# Patient Record
Sex: Female | Born: 1935 | Race: Black or African American | Hispanic: No | Smoking: Current every day smoker
Health system: Southern US, Community
[De-identification: ages and names within clinical notes are randomized; demographics above are authoritative.]

## PROBLEM LIST (undated history)

## (undated) DIAGNOSIS — I509 Heart failure, unspecified: Secondary | ICD-10-CM

## (undated) DIAGNOSIS — I10 Essential (primary) hypertension: Secondary | ICD-10-CM

## (undated) DIAGNOSIS — I519 Heart disease, unspecified: Secondary | ICD-10-CM

## (undated) DIAGNOSIS — M722 Plantar fascial fibromatosis: Secondary | ICD-10-CM

## (undated) DIAGNOSIS — I5030 Unspecified diastolic (congestive) heart failure: Secondary | ICD-10-CM

## (undated) DIAGNOSIS — G4762 Sleep related leg cramps: Secondary | ICD-10-CM

## (undated) DIAGNOSIS — H8110 Benign paroxysmal vertigo, unspecified ear: Secondary | ICD-10-CM

## (undated) DIAGNOSIS — M810 Age-related osteoporosis without current pathological fracture: Secondary | ICD-10-CM

## (undated) DIAGNOSIS — R222 Localized swelling, mass and lump, trunk: Secondary | ICD-10-CM

## (undated) DIAGNOSIS — E785 Hyperlipidemia, unspecified: Secondary | ICD-10-CM

## (undated) DIAGNOSIS — L84 Corns and callosities: Secondary | ICD-10-CM

## (undated) HISTORY — DX: Plantar fascial fibromatosis: M72.2

## (undated) HISTORY — DX: Heart failure, unspecified: I50.9

## (undated) HISTORY — DX: Benign paroxysmal vertigo, unspecified ear: H81.10

## (undated) HISTORY — DX: Age-related osteoporosis without current pathological fracture: M81.0

## (undated) HISTORY — DX: Essential (primary) hypertension: I10

## (undated) HISTORY — DX: Corns and callosities: L84

## (undated) HISTORY — DX: Hyperlipidemia, unspecified: E78.5

## (undated) HISTORY — PX: OTHER SURGICAL HISTORY: SHX169

## (undated) HISTORY — DX: Heart disease, unspecified: I51.9

## (undated) HISTORY — DX: Localized swelling, mass and lump, trunk: R22.2

## (undated) HISTORY — DX: Sleep related leg cramps: G47.62

---

## 2000-07-10 ENCOUNTER — Encounter (INDEPENDENT_AMBULATORY_CARE_PROVIDER_SITE_OTHER): Payer: Self-pay | Admitting: *Deleted

## 2000-07-21 ENCOUNTER — Encounter: Admission: RE | Admit: 2000-07-21 | Discharge: 2000-07-21 | Payer: Self-pay | Admitting: Family Medicine

## 2000-07-27 ENCOUNTER — Encounter: Admission: RE | Admit: 2000-07-27 | Discharge: 2000-07-27 | Payer: Self-pay | Admitting: *Deleted

## 2000-07-27 ENCOUNTER — Encounter: Payer: Self-pay | Admitting: *Deleted

## 2000-08-01 ENCOUNTER — Encounter: Admission: RE | Admit: 2000-08-01 | Discharge: 2000-08-01 | Payer: Self-pay | Admitting: Family Medicine

## 2000-09-19 ENCOUNTER — Encounter: Admission: RE | Admit: 2000-09-19 | Discharge: 2000-09-19 | Payer: Self-pay | Admitting: Family Medicine

## 2000-10-05 ENCOUNTER — Encounter: Admission: RE | Admit: 2000-10-05 | Discharge: 2000-10-05 | Payer: Self-pay | Admitting: Family Medicine

## 2000-12-13 ENCOUNTER — Encounter: Admission: RE | Admit: 2000-12-13 | Discharge: 2000-12-13 | Payer: Self-pay | Admitting: Family Medicine

## 2000-12-29 ENCOUNTER — Encounter: Admission: RE | Admit: 2000-12-29 | Discharge: 2000-12-29 | Payer: Self-pay | Admitting: Family Medicine

## 2002-04-19 ENCOUNTER — Encounter: Payer: Self-pay | Admitting: Emergency Medicine

## 2002-04-19 ENCOUNTER — Emergency Department (HOSPITAL_COMMUNITY): Admission: EM | Admit: 2002-04-19 | Discharge: 2002-04-19 | Payer: Self-pay | Admitting: Emergency Medicine

## 2002-04-22 ENCOUNTER — Encounter: Admission: RE | Admit: 2002-04-22 | Discharge: 2002-04-22 | Payer: Self-pay | Admitting: Family Medicine

## 2002-05-02 ENCOUNTER — Encounter: Admission: RE | Admit: 2002-05-02 | Discharge: 2002-05-02 | Payer: Self-pay | Admitting: Sports Medicine

## 2002-05-06 ENCOUNTER — Encounter: Admission: RE | Admit: 2002-05-06 | Discharge: 2002-05-06 | Payer: Self-pay | Admitting: Family Medicine

## 2002-05-09 ENCOUNTER — Encounter: Admission: RE | Admit: 2002-05-09 | Discharge: 2002-05-09 | Payer: Self-pay | Admitting: Family Medicine

## 2002-06-05 ENCOUNTER — Encounter: Admission: RE | Admit: 2002-06-05 | Discharge: 2002-06-05 | Payer: Self-pay | Admitting: Family Medicine

## 2002-06-14 ENCOUNTER — Encounter: Payer: Self-pay | Admitting: Sports Medicine

## 2002-06-14 ENCOUNTER — Encounter: Admission: RE | Admit: 2002-06-14 | Discharge: 2002-06-14 | Payer: Self-pay | Admitting: Sports Medicine

## 2002-06-26 ENCOUNTER — Encounter: Payer: Self-pay | Admitting: Sports Medicine

## 2002-06-26 ENCOUNTER — Encounter: Admission: RE | Admit: 2002-06-26 | Discharge: 2002-06-26 | Payer: Self-pay | Admitting: Sports Medicine

## 2002-07-02 ENCOUNTER — Encounter: Admission: RE | Admit: 2002-07-02 | Discharge: 2002-07-02 | Payer: Self-pay | Admitting: Family Medicine

## 2002-07-03 ENCOUNTER — Encounter: Admission: RE | Admit: 2002-07-03 | Discharge: 2002-07-03 | Payer: Self-pay | Admitting: Family Medicine

## 2002-07-16 ENCOUNTER — Encounter: Admission: RE | Admit: 2002-07-16 | Discharge: 2002-07-16 | Payer: Self-pay | Admitting: Family Medicine

## 2002-07-25 ENCOUNTER — Encounter: Admission: RE | Admit: 2002-07-25 | Discharge: 2002-07-25 | Payer: Self-pay | Admitting: Sports Medicine

## 2002-12-17 ENCOUNTER — Encounter: Admission: RE | Admit: 2002-12-17 | Discharge: 2002-12-17 | Payer: Self-pay | Admitting: Family Medicine

## 2002-12-18 ENCOUNTER — Encounter: Admission: RE | Admit: 2002-12-18 | Discharge: 2002-12-18 | Payer: Self-pay | Admitting: Sports Medicine

## 2002-12-18 ENCOUNTER — Encounter: Payer: Self-pay | Admitting: Sports Medicine

## 2002-12-20 ENCOUNTER — Encounter: Admission: RE | Admit: 2002-12-20 | Discharge: 2002-12-20 | Payer: Self-pay | Admitting: Sports Medicine

## 2002-12-20 ENCOUNTER — Encounter: Payer: Self-pay | Admitting: Sports Medicine

## 2003-01-20 ENCOUNTER — Inpatient Hospital Stay (HOSPITAL_COMMUNITY): Admission: EM | Admit: 2003-01-20 | Discharge: 2003-01-22 | Payer: Self-pay

## 2003-01-20 ENCOUNTER — Encounter: Payer: Self-pay | Admitting: Internal Medicine

## 2003-01-29 ENCOUNTER — Ambulatory Visit (HOSPITAL_COMMUNITY): Admission: RE | Admit: 2003-01-29 | Discharge: 2003-01-29 | Payer: Self-pay | Admitting: Family Medicine

## 2003-01-29 ENCOUNTER — Encounter: Admission: RE | Admit: 2003-01-29 | Discharge: 2003-01-29 | Payer: Self-pay | Admitting: Family Medicine

## 2003-06-25 ENCOUNTER — Encounter: Admission: RE | Admit: 2003-06-25 | Discharge: 2003-06-25 | Payer: Self-pay | Admitting: Family Medicine

## 2003-07-24 ENCOUNTER — Encounter: Admission: RE | Admit: 2003-07-24 | Discharge: 2003-07-24 | Payer: Self-pay | Admitting: Sports Medicine

## 2003-08-15 ENCOUNTER — Encounter: Admission: RE | Admit: 2003-08-15 | Discharge: 2003-08-15 | Payer: Self-pay | Admitting: Family Medicine

## 2003-09-25 ENCOUNTER — Inpatient Hospital Stay (HOSPITAL_COMMUNITY): Admission: EM | Admit: 2003-09-25 | Discharge: 2003-09-27 | Payer: Self-pay | Admitting: Emergency Medicine

## 2003-09-26 ENCOUNTER — Encounter: Payer: Self-pay | Admitting: Cardiology

## 2003-11-17 ENCOUNTER — Encounter: Admission: RE | Admit: 2003-11-17 | Discharge: 2003-11-17 | Payer: Self-pay | Admitting: Family Medicine

## 2003-12-15 ENCOUNTER — Encounter: Admission: RE | Admit: 2003-12-15 | Discharge: 2003-12-15 | Payer: Self-pay | Admitting: Family Medicine

## 2004-10-18 ENCOUNTER — Ambulatory Visit: Payer: Self-pay | Admitting: Family Medicine

## 2004-10-18 ENCOUNTER — Ambulatory Visit (HOSPITAL_COMMUNITY): Admission: RE | Admit: 2004-10-18 | Discharge: 2004-10-18 | Payer: Self-pay | Admitting: Sports Medicine

## 2005-02-16 ENCOUNTER — Emergency Department (HOSPITAL_COMMUNITY): Admission: EM | Admit: 2005-02-16 | Discharge: 2005-02-16 | Payer: Self-pay | Admitting: Emergency Medicine

## 2005-04-11 ENCOUNTER — Ambulatory Visit: Payer: Self-pay | Admitting: Family Medicine

## 2005-06-07 ENCOUNTER — Ambulatory Visit: Payer: Self-pay | Admitting: Family Medicine

## 2005-06-24 ENCOUNTER — Emergency Department (HOSPITAL_COMMUNITY): Admission: EM | Admit: 2005-06-24 | Discharge: 2005-06-24 | Payer: Self-pay | Admitting: Emergency Medicine

## 2005-07-06 ENCOUNTER — Ambulatory Visit: Payer: Self-pay | Admitting: Family Medicine

## 2005-08-25 ENCOUNTER — Ambulatory Visit: Payer: Self-pay | Admitting: Sports Medicine

## 2005-08-26 ENCOUNTER — Encounter: Admission: RE | Admit: 2005-08-26 | Discharge: 2005-08-26 | Payer: Self-pay | Admitting: Sports Medicine

## 2005-11-04 ENCOUNTER — Ambulatory Visit: Payer: Self-pay | Admitting: Sports Medicine

## 2005-11-21 ENCOUNTER — Emergency Department (HOSPITAL_COMMUNITY): Admission: EM | Admit: 2005-11-21 | Discharge: 2005-11-21 | Payer: Self-pay | Admitting: Emergency Medicine

## 2005-12-16 ENCOUNTER — Ambulatory Visit: Payer: Self-pay | Admitting: Family Medicine

## 2005-12-19 ENCOUNTER — Ambulatory Visit: Payer: Self-pay | Admitting: Family Medicine

## 2006-01-03 ENCOUNTER — Ambulatory Visit: Payer: Self-pay | Admitting: Sports Medicine

## 2006-01-25 ENCOUNTER — Ambulatory Visit: Payer: Self-pay | Admitting: Family Medicine

## 2006-08-17 ENCOUNTER — Ambulatory Visit: Payer: Self-pay | Admitting: Sports Medicine

## 2006-09-23 ENCOUNTER — Emergency Department (HOSPITAL_COMMUNITY): Admission: EM | Admit: 2006-09-23 | Discharge: 2006-09-23 | Payer: Self-pay | Admitting: Emergency Medicine

## 2006-12-07 DIAGNOSIS — K219 Gastro-esophageal reflux disease without esophagitis: Secondary | ICD-10-CM | POA: Insufficient documentation

## 2006-12-07 DIAGNOSIS — M1990 Unspecified osteoarthritis, unspecified site: Secondary | ICD-10-CM | POA: Insufficient documentation

## 2006-12-07 DIAGNOSIS — J309 Allergic rhinitis, unspecified: Secondary | ICD-10-CM | POA: Insufficient documentation

## 2006-12-07 DIAGNOSIS — I200 Unstable angina: Secondary | ICD-10-CM

## 2006-12-08 ENCOUNTER — Encounter (INDEPENDENT_AMBULATORY_CARE_PROVIDER_SITE_OTHER): Payer: Self-pay | Admitting: *Deleted

## 2006-12-27 ENCOUNTER — Encounter (INDEPENDENT_AMBULATORY_CARE_PROVIDER_SITE_OTHER): Payer: Self-pay | Admitting: Family Medicine

## 2006-12-27 ENCOUNTER — Ambulatory Visit: Payer: Self-pay | Admitting: Sports Medicine

## 2006-12-27 DIAGNOSIS — I10 Essential (primary) hypertension: Secondary | ICD-10-CM

## 2006-12-27 DIAGNOSIS — J4489 Other specified chronic obstructive pulmonary disease: Secondary | ICD-10-CM | POA: Insufficient documentation

## 2006-12-27 DIAGNOSIS — F17200 Nicotine dependence, unspecified, uncomplicated: Secondary | ICD-10-CM | POA: Insufficient documentation

## 2006-12-27 DIAGNOSIS — E785 Hyperlipidemia, unspecified: Secondary | ICD-10-CM | POA: Insufficient documentation

## 2006-12-27 DIAGNOSIS — J449 Chronic obstructive pulmonary disease, unspecified: Secondary | ICD-10-CM

## 2006-12-27 LAB — CONVERTED CEMR LAB
Albumin: 4.4 g/dL (ref 3.5–5.2)
Alkaline Phosphatase: 65 units/L (ref 39–117)
BUN: 16 mg/dL (ref 6–23)
CO2: 23 meq/L (ref 19–32)
Chloride: 104 meq/L (ref 96–112)
Glucose, Bld: 92 mg/dL (ref 70–99)
Protein, U semiquant: NEGATIVE
RBC / HPF: 0
Sodium: 140 meq/L (ref 135–145)
Total Protein: 7.5 g/dL (ref 6.0–8.3)
Urobilinogen, UA: 0.2

## 2007-02-27 ENCOUNTER — Ambulatory Visit: Payer: Self-pay | Admitting: Family Medicine

## 2007-02-27 LAB — CONVERTED CEMR LAB
Cholesterol, target level: 200 mg/dL
HDL goal, serum: 40 mg/dL

## 2007-02-28 ENCOUNTER — Encounter (INDEPENDENT_AMBULATORY_CARE_PROVIDER_SITE_OTHER): Payer: Self-pay | Admitting: Family Medicine

## 2007-02-28 ENCOUNTER — Ambulatory Visit (HOSPITAL_COMMUNITY): Admission: RE | Admit: 2007-02-28 | Discharge: 2007-02-28 | Payer: Self-pay | Admitting: Family Medicine

## 2007-03-02 ENCOUNTER — Encounter (INDEPENDENT_AMBULATORY_CARE_PROVIDER_SITE_OTHER): Payer: Self-pay | Admitting: *Deleted

## 2007-03-20 ENCOUNTER — Ambulatory Visit: Payer: Self-pay | Admitting: Family Medicine

## 2007-03-20 DIAGNOSIS — M810 Age-related osteoporosis without current pathological fracture: Secondary | ICD-10-CM | POA: Insufficient documentation

## 2007-03-21 ENCOUNTER — Ambulatory Visit (HOSPITAL_COMMUNITY): Admission: RE | Admit: 2007-03-21 | Discharge: 2007-03-21 | Payer: Self-pay | Admitting: Family Medicine

## 2007-03-21 ENCOUNTER — Ambulatory Visit: Payer: Self-pay | Admitting: Family Medicine

## 2007-03-30 ENCOUNTER — Ambulatory Visit: Payer: Self-pay | Admitting: Family Medicine

## 2007-03-30 ENCOUNTER — Encounter (INDEPENDENT_AMBULATORY_CARE_PROVIDER_SITE_OTHER): Payer: Self-pay | Admitting: Family Medicine

## 2007-03-30 LAB — CONVERTED CEMR LAB
Creatinine, Ser: 1.01 mg/dL (ref 0.40–1.20)
HDL: 58 mg/dL (ref >39)
Hemoglobin: 13.4 g/dL
Platelets: 290 10*3/uL
RBC: 4.21 M/uL

## 2007-04-25 ENCOUNTER — Ambulatory Visit: Payer: Self-pay | Admitting: Family Medicine

## 2007-04-25 ENCOUNTER — Encounter: Payer: Self-pay | Admitting: Family Medicine

## 2007-05-15 ENCOUNTER — Ambulatory Visit: Payer: Self-pay | Admitting: Sports Medicine

## 2007-05-30 ENCOUNTER — Encounter: Payer: Self-pay | Admitting: Family Medicine

## 2007-05-30 ENCOUNTER — Ambulatory Visit: Payer: Self-pay | Admitting: Family Medicine

## 2007-05-30 LAB — CONVERTED CEMR LAB
BUN: 13 mg/dL (ref 6–23)
CO2: 27 meq/L (ref 19–32)
Calcium: 9.1 mg/dL (ref 8.4–10.5)
Creatinine, Ser: 0.9 mg/dL (ref 0.40–1.20)
Potassium: 4.1 meq/L (ref 3.5–5.3)
Sodium: 140 meq/L (ref 135–145)

## 2007-07-03 ENCOUNTER — Ambulatory Visit: Payer: Self-pay | Admitting: Family Medicine

## 2007-07-17 ENCOUNTER — Ambulatory Visit: Payer: Self-pay | Admitting: Sports Medicine

## 2007-10-17 ENCOUNTER — Telehealth: Payer: Self-pay | Admitting: *Deleted

## 2007-11-02 ENCOUNTER — Ambulatory Visit: Payer: Self-pay | Admitting: Family Medicine

## 2008-07-16 ENCOUNTER — Ambulatory Visit (HOSPITAL_COMMUNITY): Admission: RE | Admit: 2008-07-16 | Discharge: 2008-07-16 | Payer: Self-pay | Admitting: Family Medicine

## 2008-07-16 ENCOUNTER — Ambulatory Visit: Payer: Self-pay | Admitting: Family Medicine

## 2008-07-16 DIAGNOSIS — R079 Chest pain, unspecified: Secondary | ICD-10-CM

## 2008-07-21 ENCOUNTER — Encounter: Payer: Self-pay | Admitting: Family Medicine

## 2008-07-22 ENCOUNTER — Encounter: Payer: Self-pay | Admitting: Family Medicine

## 2008-07-22 ENCOUNTER — Ambulatory Visit: Payer: Self-pay | Admitting: Family Medicine

## 2008-07-22 LAB — CONVERTED CEMR LAB
BUN: 22 mg/dL (ref 6–23)
Calcium: 9.4 mg/dL (ref 8.4–10.5)
Creatinine, Ser: 0.96 mg/dL (ref 0.40–1.20)
Glucose, Bld: 90 mg/dL (ref 70–99)
Potassium: 4.8 meq/L (ref 3.5–5.3)

## 2008-07-23 ENCOUNTER — Encounter: Payer: Self-pay | Admitting: Family Medicine

## 2008-09-15 ENCOUNTER — Telehealth: Payer: Self-pay | Admitting: *Deleted

## 2008-09-16 ENCOUNTER — Ambulatory Visit: Payer: Self-pay | Admitting: Family Medicine

## 2008-09-16 DIAGNOSIS — R42 Dizziness and giddiness: Secondary | ICD-10-CM

## 2008-09-22 ENCOUNTER — Encounter: Admission: RE | Admit: 2008-09-22 | Discharge: 2008-09-22 | Payer: Self-pay | Admitting: Family Medicine

## 2008-09-22 ENCOUNTER — Ambulatory Visit: Payer: Self-pay | Admitting: Family Medicine

## 2008-10-21 ENCOUNTER — Encounter: Payer: Self-pay | Admitting: Family Medicine

## 2008-10-21 ENCOUNTER — Ambulatory Visit: Payer: Self-pay | Admitting: Family Medicine

## 2008-10-27 ENCOUNTER — Encounter (INDEPENDENT_AMBULATORY_CARE_PROVIDER_SITE_OTHER): Payer: Self-pay | Admitting: *Deleted

## 2008-10-27 ENCOUNTER — Ambulatory Visit: Payer: Self-pay | Admitting: Family Medicine

## 2008-10-28 ENCOUNTER — Encounter: Payer: Self-pay | Admitting: Family Medicine

## 2008-10-28 ENCOUNTER — Ambulatory Visit: Payer: Self-pay | Admitting: Family Medicine

## 2008-10-28 LAB — CONVERTED CEMR LAB
ALT: 11 units/L (ref 0–35)
Albumin: 3.8 g/dL (ref 3.5–5.2)
Glucose, Bld: 86 mg/dL (ref 70–99)
Potassium: 4.6 meq/L (ref 3.5–5.3)
Sodium: 143 meq/L (ref 135–145)
Total Bilirubin: 0.5 mg/dL (ref 0.3–1.2)
Total Protein: 6.4 g/dL (ref 6.0–8.3)
Triglycerides: 69 mg/dL (ref <150)
VLDL: 14 mg/dL (ref 0–40)

## 2008-10-29 ENCOUNTER — Encounter: Payer: Self-pay | Admitting: Family Medicine

## 2009-01-15 ENCOUNTER — Ambulatory Visit: Payer: Self-pay | Admitting: Family Medicine

## 2009-01-15 ENCOUNTER — Ambulatory Visit (HOSPITAL_COMMUNITY): Admission: RE | Admit: 2009-01-15 | Discharge: 2009-01-15 | Payer: Self-pay | Admitting: Family Medicine

## 2009-01-21 ENCOUNTER — Encounter: Payer: Self-pay | Admitting: Family Medicine

## 2009-02-18 ENCOUNTER — Ambulatory Visit (HOSPITAL_COMMUNITY): Admission: RE | Admit: 2009-02-18 | Discharge: 2009-02-18 | Payer: Self-pay | Admitting: Family Medicine

## 2009-02-18 ENCOUNTER — Telehealth: Payer: Self-pay | Admitting: Family Medicine

## 2009-02-18 ENCOUNTER — Ambulatory Visit: Payer: Self-pay | Admitting: Family Medicine

## 2009-02-20 ENCOUNTER — Encounter: Payer: Self-pay | Admitting: Family Medicine

## 2009-08-28 ENCOUNTER — Ambulatory Visit: Payer: Self-pay | Admitting: Family Medicine

## 2009-09-11 ENCOUNTER — Encounter: Payer: Self-pay | Admitting: Family Medicine

## 2009-09-11 ENCOUNTER — Ambulatory Visit: Payer: Self-pay | Admitting: Family Medicine

## 2009-09-11 LAB — CONVERTED CEMR LAB
ALT: 14 units/L (ref 0–35)
AST: 18 units/L (ref 0–37)
Alkaline Phosphatase: 70 units/L (ref 39–117)
BUN: 22 mg/dL (ref 6–23)
Calcium: 9.7 mg/dL (ref 8.4–10.5)
Creatinine, Ser: 1.02 mg/dL (ref 0.40–1.20)
Total CK: 107 units/L (ref 7–177)

## 2009-09-14 ENCOUNTER — Encounter: Payer: Self-pay | Admitting: Family Medicine

## 2009-11-27 ENCOUNTER — Ambulatory Visit: Payer: Self-pay | Admitting: Family Medicine

## 2009-11-27 ENCOUNTER — Encounter: Payer: Self-pay | Admitting: Family Medicine

## 2009-11-27 DIAGNOSIS — R609 Edema, unspecified: Secondary | ICD-10-CM

## 2009-11-27 DIAGNOSIS — I499 Cardiac arrhythmia, unspecified: Secondary | ICD-10-CM | POA: Insufficient documentation

## 2009-11-27 LAB — CONVERTED CEMR LAB
ALT: 12 units/L (ref 0–35)
AST: 15 units/L (ref 0–37)
CO2: 26 meq/L (ref 19–32)
Calcium: 9.3 mg/dL (ref 8.4–10.5)
Chloride: 103 meq/L (ref 96–112)
Creatinine, Ser: 0.96 mg/dL (ref 0.40–1.20)
Glucose, Bld: 77 mg/dL (ref 70–99)
Pro B Natriuretic peptide (BNP): 38.7 pg/mL (ref 0.0–100.0)
Sodium: 140 meq/L (ref 135–145)
Total Protein: 7.1 g/dL (ref 6.0–8.3)

## 2009-11-28 ENCOUNTER — Encounter: Payer: Self-pay | Admitting: Family Medicine

## 2009-12-03 ENCOUNTER — Encounter: Payer: Self-pay | Admitting: Family Medicine

## 2010-01-20 ENCOUNTER — Encounter: Payer: Self-pay | Admitting: Family Medicine

## 2010-01-20 ENCOUNTER — Ambulatory Visit: Payer: Self-pay | Admitting: Family Medicine

## 2010-01-20 LAB — CONVERTED CEMR LAB
Bilirubin Urine: NEGATIVE
Glucose, Urine, Semiquant: NEGATIVE
HCT: 39.7 % (ref 36.0–46.0)
Ketones, urine, test strip: NEGATIVE
MCHC: 32 g/dL (ref 30.0–36.0)
MCV: 94.7 fL (ref 78.0–100.0)
Nitrite: NEGATIVE
pH: 6.5

## 2010-01-21 ENCOUNTER — Encounter: Payer: Self-pay | Admitting: Family Medicine

## 2010-01-25 ENCOUNTER — Telehealth: Payer: Self-pay | Admitting: Family Medicine

## 2010-01-26 ENCOUNTER — Ambulatory Visit: Payer: Self-pay | Admitting: Family Medicine

## 2010-01-26 ENCOUNTER — Encounter: Admission: RE | Admit: 2010-01-26 | Discharge: 2010-01-26 | Payer: Self-pay | Admitting: Family Medicine

## 2010-01-26 DIAGNOSIS — M25579 Pain in unspecified ankle and joints of unspecified foot: Secondary | ICD-10-CM

## 2010-02-12 ENCOUNTER — Encounter: Admission: RE | Admit: 2010-02-12 | Discharge: 2010-02-12 | Payer: Self-pay | Admitting: Family Medicine

## 2010-02-12 ENCOUNTER — Ambulatory Visit: Payer: Self-pay | Admitting: Family Medicine

## 2010-02-24 ENCOUNTER — Encounter: Admission: RE | Admit: 2010-02-24 | Discharge: 2010-03-04 | Payer: Self-pay | Admitting: Family Medicine

## 2010-02-26 ENCOUNTER — Ambulatory Visit: Payer: Self-pay | Admitting: Family Medicine

## 2010-03-10 ENCOUNTER — Encounter: Payer: Self-pay | Admitting: Family Medicine

## 2010-03-15 ENCOUNTER — Encounter: Payer: Self-pay | Admitting: Family Medicine

## 2010-07-28 ENCOUNTER — Ambulatory Visit: Payer: Self-pay | Admitting: Family Medicine

## 2010-07-28 ENCOUNTER — Telehealth: Payer: Self-pay | Admitting: Family Medicine

## 2010-08-23 ENCOUNTER — Ambulatory Visit: Payer: Self-pay | Admitting: Family Medicine

## 2010-08-24 ENCOUNTER — Encounter: Payer: Self-pay | Admitting: Family Medicine

## 2010-08-24 ENCOUNTER — Ambulatory Visit: Payer: Self-pay | Admitting: Family Medicine

## 2010-08-24 LAB — CONVERTED CEMR LAB
BUN: 16 mg/dL (ref 6–23)
CO2: 29 meq/L (ref 19–32)
Calcium: 9 mg/dL (ref 8.4–10.5)
Creatinine, Ser: 1.05 mg/dL (ref 0.40–1.20)
Glucose, Bld: 90 mg/dL (ref 70–99)
HDL: 53 mg/dL (ref >39)
LDL Cholesterol: 118 mg/dL — ABNORMAL HIGH (ref 0–99)
VLDL: 11 mg/dL (ref 0–40)

## 2010-08-26 ENCOUNTER — Encounter: Payer: Self-pay | Admitting: Family Medicine

## 2010-11-03 ENCOUNTER — Encounter (INDEPENDENT_AMBULATORY_CARE_PROVIDER_SITE_OTHER): Payer: Self-pay | Admitting: *Deleted

## 2010-11-09 NOTE — Assessment & Plan Note (Signed)
Summary: fu/tcb   Vital Signs:  Patient profile:   77 year old female Height:      66 inches Weight:      174.3 pounds BMI:     28.23 Temp:     97.7 degrees F oral Pulse rate:   73 / minute BP sitting:   156 / 77  (left arm) Cuff size:   regular  Vitals Entered By: Garen Grams LPN (Feb 26, 2010 8:43 AM) CC: f/u bp, ankle Is Patient Diabetic? No Pain Assessment Patient in pain? no        Primary Care Provider:  Romero Belling MD  CC:  f/u bp and ankle.  History of Present Illness: 77 yo female here for f/u of:  ANKLE PAIN.  Much improved after dropping picnic table on her foot.  Xrays x2 demonstrate no fracture.  Taking Ibuprofen for pain, using ice, intermittent Ace wrap.  Essentially no pain and has normal ROM in LEFT ankle at this point.  Went to PT x1, but has not gone back as it is very expensive.    HTN.  Not taking medicines.  Denies chest pain, dyspnea, orthopnea, PND, LE edema.  Endorses DOE after 1-2 blocks of walking.  ALLERGIC RHINITIS.  Not taking Fluticasone.  Nasal congestion, rhinorrhea, bilateral maxillary pressure for the past few days.  Is a smoker.  Afebrile in clinic.  Also reviewed today:  HLD.  Lipids given other risk factors (smoking, HTN).  Not taking her Lipitor.  MEDICATION NONADHERENCE.  Discussed barriers to medication adherence.  Finances not an issue for this.  Cannot say why she doesn't take.  Declines assistance.  Habits & Providers  Alcohol-Tobacco-Diet     Tobacco Status: current     Tobacco Counseling: to quit use of tobacco products  Current Medications (verified): 1)  Loratadine 10 Mg Tabs (Loratadine) .Marland Kitchen.. 1 Tab By Mouth Daily 2)  Fosamax 70 Mg Tabs (Alendronate Sodium) .Marland Kitchen.. 1 Tab Weekly 3)  Lipitor 80 Mg Tabs (Atorvastatin Calcium) .... Take 1 Tablet By Mouth As Directed 4)  Fluticasone Propionate 50 Mcg/act Susp (Fluticasone Propionate) .Marland Kitchen.. 1 Spray Per Nostril Daily 5)  Famotidine 20 Mg Tabs (Famotidine) .Marland Kitchen.. 1 Tab By Mouth  Two Times A Day 6)  Altace 5 Mg  Tabs (Ramipril) .Marland Kitchen.. 1 Tab By Mouth Daily 7)  Imdur 30 Mg  Tb24 (Isosorbide Mononitrate) .Marland Kitchen.. 1 Tab By Mouth Daily 8)  Adult Aspirin Ec Low Strength 81 Mg  Tbec (Aspirin) .Marland Kitchen.. 1 Tab By Mouth Daily 9)  Hydrochlorothiazide 25 Mg  Tabs (Hydrochlorothiazide) .Marland Kitchen.. 1 Tab By Mouth Daily  Allergies (verified): 1)  ! Codeine 2)  Codeine Phosphate (Codeine Phosphate)  Review of Systems       Per HPI.  Physical Exam  Additional Exam:  VITALS:  Reviewed, hypertensive GEN: Alert & oriented, no acute distress NECK: Midline trachea, no masses/thyromegaly, no cervical lymphadenopathy CARDIO: Regular rate and rhythm, no murmurs/rubs/gallops, 2+ bilateral radial pulses RESP: Clear to auscultation, normal work of breathing, no retractions/accessory muscle use EXT: Nontender, no edema EARS:  External ear without significant lesions or deformities.  Clear canals, TM intact bilaterally without bulging, retraction, inflammation or discharge. Hearing grossly normal bilaterally. NOSE:  Nasal mucosa are pink and moist without lesions or exudates. MOUTH:  Oral mucosa and oropharynx without lesions or exudates. MSK:  LEFT ankle: nontender, trace swelling, full ROM, 2+ DP, sensation intact to light touch    Impression & Recommendations:  Problem # 1:  ANKLE PAIN, LEFT (  IHK-742.59) Assessment Improved Asked to call and cancel at PT if she won't be going.  Switch from Ibuprofen to APAP as needed for pain given HTN.  Follow up as needed. Orders: [facility]- Est  Level 4 (56387)  Problem # 2:  HYPERTENSION, BENIGN ESSENTIAL (ICD-401.1) Assessment: Unchanged Poorly controlled chronically but admits to medication nonadherence.  Advised to add back medications one at a time every 3 days.  RTC if becomes dizzy. Her updated medication list for this problem includes:    Altace 5 Mg Tabs (Ramipril) .Marland Kitchen... 1 tab by mouth daily    Hydrochlorothiazide 25 Mg Tabs (Hydrochlorothiazide) .Marland Kitchen... 1  tab by mouth daily  BP today: 156/77 Prior BP: 170/73 (02/12/2010)  Prior 10 Yr Risk Heart Disease: N/A (02/27/2007)  Labs Reviewed: K+: 4.3 (11/27/2009) Creat: : 0.96 (11/27/2009)   Chol: 269 (10/28/2008)   HDL: 53 (10/28/2008)   LDL: 202 (10/28/2008)   TG: 69 (10/28/2008)  Orders: [facility]- Est  Level 4 (56433)  Problem # 3:  HYPERLIPIDEMIA (ICD-272.4) Assessment: Unchanged Poorly controlled on maximum dose of Lipitor, but admits to medication nonadherence.  Advised to restart Lipitor. Her updated medication list for this problem includes:    Lipitor 80 Mg Tabs (Atorvastatin calcium) .Marland Kitchen... Take 1 tablet by mouth as directed  Labs Reviewed: SGOT: 15 (11/27/2009)   SGPT: 12 (11/27/2009)  Lipid Goals: Chol Goal: 200 (02/27/2007)   HDL Goal: 40 (02/27/2007)   LDL Goal: 100 (02/27/2007)   TG Goal: 150 (02/27/2007)  Prior 10 Yr Risk Heart Disease: N/A (02/27/2007)   HDL:53 (10/28/2008), 58 (03/30/2007)  LDL:202 (10/28/2008), 190 (03/30/2007)  Chol:269 (10/28/2008), 263 (03/30/2007)  Trig:69 (10/28/2008), 77 (03/30/2007)  Orders: [facility]- Est  Level 4 (99214)  Problem # 4:  RHINITIS, ALLERGIC (ICD-477.9) Assessment: Deteriorated Refill Fluticasone. Her updated medication list for this problem includes:    Loratadine 10 Mg Tabs (Loratadine) .Marland Kitchen... 1 tab by mouth daily    Fluticasone Propionate 50 Mcg/act Susp (Fluticasone propionate) .Marland Kitchen... 1 spray per nostril daily  Orders: [facility]- Est  Level 4 (29518)  Complete Medication List: 1)  Loratadine 10 Mg Tabs (Loratadine) .Marland Kitchen.. 1 tab by mouth daily 2)  Fosamax 70 Mg Tabs (Alendronate sodium) .Marland Kitchen.. 1 tab weekly 3)  Lipitor 80 Mg Tabs (Atorvastatin calcium) .... Take 1 tablet by mouth as directed 4)  Fluticasone Propionate 50 Mcg/act Susp (Fluticasone propionate) .Marland Kitchen.. 1 spray per nostril daily 5)  Famotidine 20 Mg Tabs (Famotidine) .Marland Kitchen.. 1 tab by mouth two times a day 6)  Altace 5 Mg Tabs (Ramipril) .Marland Kitchen.. 1 tab by mouth daily 7)  Imdur 30 Mg Tb24  (Isosorbide mononitrate) .Marland Kitchen.. 1 tab by mouth daily 8)  Adult Aspirin Ec Low Strength 81 Mg Tbec (Aspirin) .Marland Kitchen.. 1 tab by mouth daily 9)  Hydrochlorothiazide 25 Mg Tabs (Hydrochlorothiazide) .Marland Kitchen.. 1 tab by mouth daily  Patient Instructions: 1)  It has been a pleasure working with you. 2)  Please schedule a follow-up appointment in 3 months.  3)  Please stop taking Ibuprofen--use Tylenol as needed for pain relief (1000 mg by mouth three times a day). 4)  I have sent a prescription for Fluticasone nasal spray for your allergies. 5)  Please start taking your hydrochlorothiazide daily for blood pressure.  Then add your Altace after 3 days if not dizzy, then add Imdur after 3 days if not dizzy. 6)  Call our office if you become dizzy on your medicines or if you develop chest pain or shortness of breath. Prescriptions: FLUTICASONE PROPIONATE 50  MCG/ACT SUSP (FLUTICASONE PROPIONATE) 1 spray per nostril daily  #1 x 3   Entered and Authorized by:   Romero Belling MD   Signed by:   Romero Belling MD on 02/26/2010   Method used:   Electronically to        [facility]  [address] [id number] (retail)       [address]       [place], [place]  [address]       Ph: [phone] or [phone]       Fax: [phone]   RxID:   [id number]

## 2010-11-09 NOTE — Progress Notes (Signed)
Summary: triage  Phone Note Call from Patient Call back at Home Phone [phone]   Caller: Patient Summary of Call: Pt dropped pinic table on ankle. Initial call taken by: Clydell Hakim,  January 25, 2010 11:47 AM  Follow-up for Phone Call        happened yesterday. large wood table. swollen & bruised. able to walk with a hobble. to be here at 1:30 .will see Dr. Janalyn Harder Follow-up by: Golden Circle RN,  January 25, 2010 12:02 PM

## 2010-11-09 NOTE — Assessment & Plan Note (Signed)
Summary: dizziness/Earl/spiegel   Vital Signs:  Patient profile:   77 year old female Height:      66 inches Weight:      165.7 pounds BMI:     26.84 Temp:     98.0 degrees F oral Pulse rate:   69 / minute BP sitting:   134 / 62  (left arm) Cuff size:   regular  Vitals Entered By: Jimmy Footman, CMA (July 28, 2010 10:28 AM) CC: dizziness x3 days Is Patient Diabetic? No Pain Assessment Patient in pain? no        Primary Care Provider:  Ellery Plunk MD  CC:  dizziness x3 days.  History of Present Illness: Dizzy since Monday  (3 days).  Describes a lightheadded sensation and a feeling of unsteadyness on feet.  Getting over a cold last week. No medication for cold. Got better on own. No new medication. No sedating medication. No vertigo, headache, vision changes. No fever, chills, N/V.  Well otherwise.   Habits & Providers  Alcohol-Tobacco-Diet     Alcohol drinks/day: 0     Tobacco Status: current     Tobacco Counseling: to quit use of tobacco products     Cigarette Packs/Day: 1.0     Year Started: 1948     Pack years: 60  Current Problems (verified): 1)  Dizziness  (ICD-780.4) 2)  Hyperlipidemia  (ICD-272.4) 3)  Hypertension, Benign Essential  (ICD-401.1) 4)  Ankle Pain, Left  (ICD-719.47) 5)  Edema  (ICD-782.3) 6)  Irregular Heart Rate  (ICD-427.9) 7)  Vertigo  (ICD-780.4) 8)  Chest Pain  (ICD-786.50) 9)  Osteoporosis Nos  (ICD-733.00) 10)  Rhinitis, Allergic  (ICD-477.9) 11)  Gastroesophageal Reflux, No Esophagitis  (ICD-530.81) 12)  Djd, Unspecified  (ICD-715.90) 13)  Angina, Unstable  (ICD-411.1) 14)  Tobacco Use  (ICD-305.1) 15)  Chronic Obstructive Pulmonary Disease  (ICD-496)  Current Medications (verified): 1)  Loratadine 10 Mg Tabs (Loratadine) .Marland Kitchen.. 1 Tab By Mouth Daily 2)  Fosamax 70 Mg Tabs (Alendronate Sodium) .Marland Kitchen.. 1 Tab Weekly 3)  Lipitor 80 Mg Tabs (Atorvastatin Calcium) .... Take 1 Tablet By Mouth As Directed 4)  Fluticasone Propionate 50  Mcg/act Susp (Fluticasone Propionate) .Marland Kitchen.. 1 Spray Per Nostril Daily 5)  Famotidine 20 Mg Tabs (Famotidine) .Marland Kitchen.. 1 Tab By Mouth Two Times A Day 6)  Altace 5 Mg  Tabs (Ramipril) .Marland Kitchen.. 1 Tab By Mouth Daily 7)  Imdur 30 Mg  Tb24 (Isosorbide Mononitrate) .Marland Kitchen.. 1 Tab By Mouth Daily 8)  Adult Aspirin Ec Low Strength 81 Mg  Tbec (Aspirin) .Marland Kitchen.. 1 Tab By Mouth Daily 9)  Hydrochlorothiazide 25 Mg  Tabs (Hydrochlorothiazide) .Marland Kitchen.. 1 Tab By Mouth Daily  Allergies (verified): 1)  ! Codeine 2)  Codeine Phosphate (Codeine Phosphate)  Past History:  Past Medical History: Last updated: 04/25/2007 Gastric ulcer w/hemorrhage (08/2000) [employer] - 1 ppd-- > 6 cigs/day, attempting to quit  Past Surgical History: Last updated: 04/25/2007 12/04 carotid dopplers; mild int wall thickening b/l  12/04 2D Echo- EF 55-65%, mild increased PA pressures ABI=0.97 (normal) - 01/04/2006 Cardiac Cath- mild- mod LV syst fxn with HK of mid inf/ant walls from diffuse coronary vasospasm - 01/29/2003, dexa scan - T -2.39  Z -1.17 mod/high risk - 07/10/2002, dexa scan T score =  -2.24 (AA database) - 07/16/2002, BMD scan  T = -2.39 (r heel)BMD scan  T = -2.87 (r heel) - 07/10/2000 upper GI  12/98 - normal  Social History: Last updated: 01/26/2010 Widowed, lives  with daughter and daughter's two children; >50 pack years; no etoh; no drug use; enjoys gardening, cooking, crafts; [place]  Risk Factors: Smoking Status: current (07/28/2010) Packs/Day: 1.0 (07/28/2010)  Family History: Reviewed history from 12/07/2006 and no changes required. HTN; arthritis  Review of Systems  The patient denies anorexia, fever, weight loss, vision loss, decreased hearing, hoarseness, chest pain, dyspnea on exertion, headaches, hemoptysis, melena, severe indigestion/heartburn, muscle weakness, transient blindness, depression, and enlarged lymph nodes.    Physical Exam  General:  Vs noted.  Well NAD Head:  Normocephalic  and atraumatic without obvious abnormalities. No apparent alopecia or balding. Eyes:  vision grossly intact, pupils equal, pupils round, pupils reactive to light, pupils react to accomodation, no injection, right cataract, and left cataract.   Ears:  External ear exam shows no significant lesions or deformities.  Otoscopic examination reveals clear canals, tympanic membranes are intact bilaterally without bulging, retraction, inflammation or discharge. Hearing is grossly normal bilaterally. Nose:  External nasal examination shows no deformity or inflammation. Nasal mucosa are pink and moist without lesions or exudates. Mouth:  MMM, no pharyngeal irritation. Neck:  No deformities, masses, or tenderness noted. Lungs:  Normal respiratory effort, chest expands symmetrically. Lungs are clear to auscultation, no crackles or wheezes. Heart:  Normal rate and regular rhythm. S1 and S2 normal without gallop, murmur, click, rub or other extra sounds. Abdomen:  Bowel sounds positive,abdomen soft and non-tender without masses, organomegaly or hernias noted. Extremities:  Non edemetus BL LE Neurologic:  alert & oriented X3, cranial nerves II-XII intact, strength normal in all extremities, sensation intact to light touch, DTRs symmetrical and normal, finger-to-nose normal, heel-to-shin normal, toes down bilaterally on Babinski, Romberg negative, and abnormal gait.    Gait: Slightly wide based. Some weaving. Touches wall twice for rebalance.  Skin:  Intact without suspicious lesions or rashes Cervical Nodes:  No lymphadenopathy noted Axillary Nodes:  No palpable lymphadenopathy Psych:  Cognition and judgment appear intact. Alert and cooperative with normal attention span and concentration. No apparent delusions, illusions, hallucinations   Impression & Recommendations:  Problem # 1:  DIZZINESS (ICD-780.4) Assessment New  Uncertain diagnosis at this time. Likely some vestibular abnormality following URI/Sinus  infection.  No medication cause apparent at this time.  No red flags at this exam.  Plan conservative follow up in 1-2 weeks with PCP. Reviewed precautions. Daughter and patient feel she is safe at home. Will come back sooner if worse or red flags emerge.  May consider head scan in 2 weeks if not improved.    Her updated medication list for this problem includes:    Loratadine 10 Mg Tabs (Loratadine) .Marland Kitchen... 1 tab by mouth daily  Orders: [facility]- Est Level  3 ([address])  Complete Medication List: 1)  Loratadine 10 Mg Tabs (Loratadine) .Marland Kitchen.. 1 tab by mouth daily 2)  Fosamax 70 Mg Tabs (Alendronate sodium) .Marland Kitchen.. 1 tab weekly 3)  Lipitor 80 Mg Tabs (Atorvastatin calcium) .... Take 1 tablet by mouth as directed 4)  Fluticasone Propionate 50 Mcg/act Susp (Fluticasone propionate) .Marland Kitchen.. 1 spray per nostril daily 5)  Famotidine 20 Mg Tabs (Famotidine) .Marland Kitchen.. 1 tab by mouth two times a day 6)  Altace 5 Mg Tabs (Ramipril) .Marland Kitchen.. 1 tab by mouth daily 7)  Imdur 30 Mg Tb24 (Isosorbide mononitrate) .Marland Kitchen.. 1 tab by mouth daily 8)  Adult Aspirin Ec Low Strength 81 Mg Tbec (Aspirin) .Marland Kitchen.. 1 tab by mouth daily 9)  Hydrochlorothiazide 25 Mg Tabs (Hydrochlorothiazide) .Marland Kitchen.. 1 tab by  mouth daily  Patient Instructions: 1)  Thank you for seeing me today. 2)  Please schedule a follow-up appointment in 1-2 weeks with Dr. Kathie Rhodes. 3)  If you get worse or start having heart problems or start falling let us know.  4)  If you have chest pain, difficulty breathing, fevers over 102 that does not get better with tylenol please call us or see a doctor.  5)  Talk with Dr. Kathie Rhodes about carataract surg.    Orders Added: 1)  [facility]- Est Level  3 [16109]

## 2010-11-09 NOTE — Letter (Signed)
Summary: Results Follow-up Letter  [facility]  [address]   [place], [place] [address]   Phone: [phone]  Fax: [phone]    01/21/2010  [address] [place], [place]  [address]  Dear Ms. Kinnard,   The following are the results of your recent test(s):  Blood Count -- normal Thyroid function -- normal  Sincerely, Madlyn Frankel. Constance Goltz, MD  Appended Document: Results Follow-up Letter mailed

## 2010-11-09 NOTE — Assessment & Plan Note (Signed)
Summary: f/u,df   Vital Signs:  Patient profile:   77 year old female Height:      66 inches Weight:      165.8 pounds BMI:     26.86 Temp:     98.2 degrees F oral Pulse rate:   61 / minute BP sitting:   128 / 71  (left arm) Cuff size:   regular  Vitals Entered By: Garen Grams LPN (August 23, 2010 10:54 AM) CC: f/u dizziness, HTN, HL Is Patient Diabetic? No Pain Assessment Patient in pain? no        Primary Care Provider:  Ellery Plunk MD  CC:  f/u dizziness, HTN, and HL.  History of Present Illness: no longer dizzy, resolved 2 days after last visit.  HTN- today improved. no change to meds.  takes meds at least 5x/week  HL- hasn't been check in 2 years.  on lipitor.  sees Dr. Elease Hashimoto but we check lipids.  missed last Dr. Elease Hashimoto appt.  GERD- symptoms not controlled on H2 blocker, wants to go back to nexium  Habits & Providers  Alcohol-Tobacco-Diet     Alcohol drinks/day: 0     Tobacco Status: current     Tobacco Counseling: to quit use of tobacco products     Cigarette Packs/Day: 1.0     Year Started: 1948     Pack years: 107  Current Medications (verified): 1)  Loratadine 10 Mg Tabs (Loratadine) .Marland Kitchen.. 1 Tab By Mouth Daily 2)  Fosamax 70 Mg Tabs (Alendronate Sodium) .Marland Kitchen.. 1 Tab Weekly 3)  Lipitor 80 Mg Tabs (Atorvastatin Calcium) .... Take 1 Tablet By Mouth As Directed 4)  Fluticasone Propionate 50 Mcg/act Susp (Fluticasone Propionate) .Marland Kitchen.. 1 Spray Per Nostril Daily 5)  Famotidine 20 Mg Tabs (Famotidine) .Marland Kitchen.. 1 Tab By Mouth Two Times A Day 6)  Altace 5 Mg  Tabs (Ramipril) .Marland Kitchen.. 1 Tab By Mouth Daily 7)  Imdur 30 Mg  Tb24 (Isosorbide Mononitrate) .Marland Kitchen.. 1 Tab By Mouth Daily 8)  Adult Aspirin Ec Low Strength 81 Mg  Tbec (Aspirin) .Marland Kitchen.. 1 Tab By Mouth Daily 9)  Hydrochlorothiazide 25 Mg  Tabs (Hydrochlorothiazide) .Marland Kitchen.. 1 Tab By Mouth Daily  Allergies (verified): 1)  ! Codeine 2)  Codeine Phosphate (Codeine Phosphate)  Review of Systems  The patient denies  anorexia, fever, weight loss, syncope, and dyspnea on exertion.    Physical Exam  General:  vs reviewedwell-developed, well-nourished, and well-hydrated.   Head:  normocephalic and atraumatic.   Eyes:  vision grossly intact.   Ears:  R ear normal and no external deformities.   Lungs:  Normal respiratory effort, chest expands symmetrically. Lungs are clear to auscultation, no crackles or wheezes. Heart:  Normal rate and regular rhythm. S1 and S2 normal without gallop, murmur, click, rub or other extra sounds. Abdomen:  Bowel sounds positive,abdomen soft and non-tender without masses, organomegaly or hernias noted.   Impression & Recommendations:  Problem # 1:  HYPERTENSION, BENIGN ESSENTIAL (ICD-401.1) Assessment Improved continue same meds.  check bmet. Her updated medication list for this problem includes:    Altace 5 Mg Tabs (Ramipril) .Marland Kitchen... 1 tab by mouth daily    Hydrochlorothiazide 25 Mg Tabs (Hydrochlorothiazide) .Marland Kitchen... 1 tab by mouth daily  Orders: [facility]- Est  Level 4 (99214)Future Orders: Basic Met-[facility] ([id number]) ... 08/23/2011  Problem # 2:  HYPERLIPIDEMIA (ICD-272.4) Assessment: Unchanged continue same meds.  check lipids.   Her updated medication list for this problem includes:    Lipitor 80 Mg Tabs (  Atorvastatin calcium) .Marland Kitchen... Take 1 tablet by mouth as directed  Orders: [facility]- Est  Level 4 (99214)Future Orders: Lipid-[facility] ([id number]) ... 08/17/2011  Problem # 3:  GASTROESOPHAGEAL REFLUX, NO ESOPHAGITIS (ICD-530.81) Assessment: Deteriorated not controlled on h2 blocker, switch to nexium Her updated medication list for this problem includes:    Nexium 40 Mg Cpdr (Esomeprazole magnesium) .Marland Kitchen... Take one daily  Orders: [facility]- Est  Level 4 (95188)  Problem # 4:  DIZZINESS (ICD-780.4) Assessment: Improved resolved Her updated medication list for this problem includes:    Loratadine 10 Mg Tabs (Loratadine) .Marland Kitchen... 1 tab by mouth daily  Orders: [facility]- Est  Level 4  (41660)  Complete Medication List: 1)  Loratadine 10 Mg Tabs (Loratadine) .Marland Kitchen.. 1 tab by mouth daily 2)  Fosamax 70 Mg Tabs (Alendronate sodium) .Marland Kitchen.. 1 tab weekly 3)  Lipitor 80 Mg Tabs (Atorvastatin calcium) .... Take 1 tablet by mouth as directed 4)  Nasonex 50 Mcg/act Susp (Mometasone furoate) .... One spray each nostril daily 5)  Altace 5 Mg Tabs (Ramipril) .Marland Kitchen.. 1 tab by mouth daily 6)  Imdur 30 Mg Tb24 (Isosorbide mononitrate) .Marland Kitchen.. 1 tab by mouth daily 7)  Adult Aspirin Ec Low Strength 81 Mg Tbec (Aspirin) .Marland Kitchen.. 1 tab by mouth daily 8)  Hydrochlorothiazide 25 Mg Tabs (Hydrochlorothiazide) .Marland Kitchen.. 1 tab by mouth daily 9)  Nexium 40 Mg Cpdr (Esomeprazole magnesium) .... Take one daily  Other Orders: Flu Vaccine 61yrs + (63016) Admin 1st Vaccine (01093) Pneumococcal Vaccine (23557) Admin of Any Addtl Vaccine (32202)  Patient Instructions: 1)  It was ncie to meet you today 2)  please make a lab appt for your cholesterol check.  Come without eating or drinking except water 3)  I will send you a letter with the results   Orders Added: 1)  Flu Vaccine 70yrs + [90658] 2)  Admin 1st Vaccine [90471] 3)  Pneumococcal Vaccine [90732] 4)  Admin of Any Addtl Vaccine [90472] 5)  [facility]- Est  Level 4 [99214] 6)  Basic Met-[facility] [[id number]] 7)  Lipid-[facility] [[id number]]   Immunizations Administered:  Influenza Vaccine # 1:    Vaccine Type: Fluvax 3+    Site: right deltoid    Mfr: GlaxoSmithKline    Dose: 0.5 ml    Route: IM    Given by: Garen Grams LPN    Exp. Date: 04/06/2011    Lot #: [id number]    VIS given: 05/04/10 version given August 23, 2010.  Pneumonia Vaccine:    Vaccine Type: Pneumovax    Site: left deltoid    Mfr: Merck    Dose: 0.5 ml    Route: IM    Given by: Garen Grams LPN    Exp. Date: 02/01/2012    Lot #: [id number]    VIS given: 09/14/09 version given August 23, 2010.  Flu Vaccine Consent Questions:    Do you have a history of severe allergic reactions to this  vaccine? no    Any prior history of allergic reactions to egg and/or gelatin? no    Do you have a sensitivity to the preservative Thimersol? no    Do you have a past history of Guillan-Barre Syndrome? no    Do you currently have an acute febrile illness? no    Have you ever had a severe reaction to latex? no    Vaccine information given and explained to patient? yes    Are you currently pregnant? no   Immunizations Administered:  Influenza Vaccine # 1:  Vaccine Type: Fluvax 3+    Site: right deltoid    Mfr: GlaxoSmithKline    Dose: 0.5 ml    Route: IM    Given by: Garen Grams LPN    Exp. Date: 04/06/2011    Lot #: [id number]    VIS given: 05/04/10 version given August 23, 2010.  Pneumonia Vaccine:    Vaccine Type: Pneumovax    Site: left deltoid    Mfr: Merck    Dose: 0.5 ml    Route: IM    Given by: Garen Grams LPN    Exp. Date: 02/01/2012    Lot #: [id number]    VIS given: 09/14/09 version given August 23, 2010.

## 2010-11-09 NOTE — Assessment & Plan Note (Signed)
Summary: ankle pain,tcb   Vital Signs:  Patient profile:   77 year old female Height:      66 inches Weight:      174 pounds BMI:     28.19 BSA:     1.89 Temp:     97.5 degrees F Pulse rate:   79 / minute BP sitting:   170 / 73  Vitals Entered By: Jone Baseman CMA (Feb 12, 2010 10:43 AM) CC: f/u hurt ankle Is Patient Diabetic? No Pain Assessment Patient in pain? yes     Location: left ankle Intensity: 6   Primary Care Provider:  Romero Belling MD  CC:  f/u hurt ankle.  History of Present Illness: ankle:  hurt L ankle approx 2 wks ago on picnic table (see last clinic note).  since then took abx as prescribed and ibuprofen but continues to have pain and significant swelling at ankle.  no fevers.  overall she has felt it has gotten a little better.  she has been icing it but without much  relief.  no new injury that she notes.   Habits & Providers  Alcohol-Tobacco-Diet     Tobacco Status: current     Tobacco Counseling: to quit use of tobacco products     Cigarette Packs/Day: 1.0  Current Medications (verified): 1)  Loratadine 10 Mg Tabs (Loratadine) .Marland Kitchen.. 1 Tab By Mouth Daily 2)  Fosamax 70 Mg Tabs (Alendronate Sodium) .Marland Kitchen.. 1 Tab Weekly 3)  Lipitor 80 Mg Tabs (Atorvastatin Calcium) .... Take 1 Tablet By Mouth As Directed 4)  Fluticasone Propionate 50 Mcg/act Susp (Fluticasone Propionate) .... 2 Sprays Per Nostril Daily.  Disp Qs X1 Month. 5)  Famotidine 20 Mg Tabs (Famotidine) .Marland Kitchen.. 1 Tab By Mouth Two Times A Day 6)  Altace 5 Mg  Tabs (Ramipril) .Marland Kitchen.. 1 Tab By Mouth Daily 7)  Imdur 30 Mg  Tb24 (Isosorbide Mononitrate) .Marland Kitchen.. 1 Tab By Mouth Daily 8)  Adult Aspirin Ec Low Strength 81 Mg  Tbec (Aspirin) .Marland Kitchen.. 1 Tab By Mouth Daily 9)  Hydrochlorothiazide 25 Mg  Tabs (Hydrochlorothiazide) .Marland Kitchen.. 1 Tab By Mouth Daily 10)  Trazodone Hcl 50 Mg Tabs (Trazodone Hcl) .Marland Kitchen.. 1-2 Tabs By Mouth At Bedtime As Needed For Insomnia 11)  Keflex 500 Mg Caps (Cephalexin) .... Take One Tablet Twice  A Day For 5 Days 12)  Ibuprofen 600 Mg Tabs (Ibuprofen) .... One Tablet Every 6 Hours As Needed For Ankle Pain  Allergies (verified): 1)  ! Codeine 2)  Codeine Phosphate (Codeine Phosphate)  Past History:  Past medical, surgical, family and social histories (including risk factors) reviewed for relevance to current acute and chronic problems.  Past Medical History: Reviewed history from 04/25/2007 and no changes required. Gastric ulcer w/hemorrhage (08/2000) [employer] - 1 ppd-- > 6 cigs/day, attempting to quit  Past Surgical History: Reviewed history from 04/25/2007 and no changes required. 12/04 carotid dopplers; mild int wall thickening b/l  12/04 2D Echo- EF 55-65%, mild increased PA pressures ABI=0.97 (normal) - 01/04/2006 Cardiac Cath- mild- mod LV syst fxn with HK of mid inf/ant walls from diffuse coronary vasospasm - 01/29/2003, dexa scan - T -2.39  Z -1.17 mod/high risk - 07/10/2002, dexa scan T score =  -2.24 (AA database) - 07/16/2002, BMD scan  T = -2.39 (r heel)BMD scan  T = -2.87 (r heel) - 07/10/2000 upper GI  12/98 - normal  Family History: Reviewed history from 12/07/2006 and no changes required. HTN; arthritis  Social History: Reviewed  history from 01/26/2010 and no changes required. Widowed, lives with daughter and daughter's two children; >50 pack years; no etoh; no drug use; enjoys gardening, cooking, crafts; [employer]  Review of Systems       per HPI  Physical Exam  General:  Well-developed,well-nourished,in no acute distress; alert,appropriate and cooperative throughout examination.  Here today with granddaughter. VS noted - hypertensive with wide pulse pressure Msk:  decreased range of motion both actively and passively due to pain.  Superficial cut located over left lateral malleolus that has scabbed over.  Left malleolus painful to palpate.  Significant  edema overlying left malleolus and even up into lower leg.  painful to squeeze  lower leg.  mild warmth lateral malleolus.  Neurovascularly intact.  calfs both measure same circumference   Impression & Recommendations:  Problem # 1:  ANKLE PAIN, LEFT (ICD-719.47) Assessment Unchanged given that this is continuing despite therapy could have missed occult fx - recheck xray contiue RICE rewrapped ankle today for swelling refilled ibuprofen PT referral for ROM, strengthening exercises f/u if not improved at least some in 2 wks  Orders: Physical Therapy Referral (PT) Diagnostic X-Ray/Fluoroscopy (Diagnostic X-Ray/Flu) [facility]- Est Level  3 ([address])  Complete Medication List: 1)  Loratadine 10 Mg Tabs (Loratadine) .Marland Kitchen.. 1 tab by mouth daily 2)  Fosamax 70 Mg Tabs (Alendronate sodium) .Marland Kitchen.. 1 tab weekly 3)  Lipitor 80 Mg Tabs (Atorvastatin calcium) .... Take 1 tablet by mouth as directed 4)  Fluticasone Propionate 50 Mcg/act Susp (Fluticasone propionate) .... 2 sprays per nostril daily.  disp qs x1 month. 5)  Famotidine 20 Mg Tabs (Famotidine) .Marland Kitchen.. 1 tab by mouth two times a day 6)  Altace 5 Mg Tabs (Ramipril) .Marland Kitchen.. 1 tab by mouth daily 7)  Imdur 30 Mg Tb24 (Isosorbide mononitrate) .Marland Kitchen.. 1 tab by mouth daily 8)  Adult Aspirin Ec Low Strength 81 Mg Tbec (Aspirin) .Marland Kitchen.. 1 tab by mouth daily 9)  Hydrochlorothiazide 25 Mg Tabs (Hydrochlorothiazide) .Marland Kitchen.. 1 tab by mouth daily 10)  Trazodone Hcl 50 Mg Tabs (Trazodone hcl) .Marland Kitchen.. 1-2 tabs by mouth at bedtime as needed for insomnia 11)  Keflex 500 Mg Caps (Cephalexin) .... Take one tablet twice a day for 5 days 12)  Ibuprofen 600 Mg Tabs (Ibuprofen) .... One tablet every 6 hours as needed for ankle pain  Patient Instructions: 1)  Please keep the ankle wrapped every day to help with the swelling. 2)  I [facility]. 3)  continue to try not to walk on the ankle much BUT we are going to get you into physical therapy for some strenghtening exercises. 4)  continue to ice as needed and keep the ankle  elevated. Prescriptions: IBUPROFEN 600 MG TABS (IBUPROFEN) one tablet every 6 hours as needed for ankle pain  #100 x 0   Entered and Authorized by:   Ancil Boozer  MD   Signed by:   Ancil Boozer  MD on 02/12/2010   Method used:   [facility]  [address] [id number]       [address]       [place], [place]  [address]       Ph: [phone] or [phone]       Fax: [phone]   RxID:   [id number]

## 2010-11-09 NOTE — Letter (Signed)
Summary: CMet, BNP -- wnl  [facility]  [address]   [place], [place] [address]   Phone: [phone]  Fax: [phone]    11/28/2009  MEKHIA BROGAN [address] [place], [place]  [address]  Dear Ms. Guidice,  The labs I drew on Friday--electrolytes, blood sugar, kidney function, liver function, and heart function--were all normal.  Sincerely, Romero Belling MD  Appended Document: CMet, BNP -- wnl mailed.

## 2010-11-09 NOTE — Assessment & Plan Note (Signed)
Summary: hand & feet swelling,df   Vital Signs:  Patient profile:   77 year old female Height:      66 inches Weight:      176 pounds BMI:     28.51 BSA:     1.90 Temp:     98.6 degrees F Pulse rate:   77 / minute BP sitting:   164 / 71  Vitals Entered By: Jone Baseman CMA (January 20, 2010 11:22 AM) CC: hand and feet swelling Is Patient Diabetic? No Pain Assessment Patient in pain? yes     Location: right shoulder Intensity: 5   Primary Care Provider:  Romero Belling MD  CC:  hand and feet swelling.  History of Present Illness: Persistent swelling of hands, feet, face at night x2 months.  Resolves during the day.  No dyspnea.  No swelling during the day.  Cannot remove rings in the morning.  Seen 1 month ago for similar and CMet, U/A, BNP were all wnl.  She endorses a low salt diet.  Last echo 2004 with normal EF.  Habits & Providers  Alcohol-Tobacco-Diet     Tobacco Status: current     Tobacco Counseling: to quit use of tobacco products     Cigarette Packs/Day: 0.75  Allergies (verified): 1)  ! Codeine 2)  Codeine Phosphate (Codeine Phosphate)  Social History: Packs/Day:  0.75  Physical Exam  Additional Exam:  VITALS:  Reviewed, hypertensive GEN: Alert & oriented, no acute distress NECK: Midline trachea, no masses/thyromegaly, no cervical lymphadenopathy CARDIO: Irregular rhythm, normal rate, no murmurs/rubs/gallops, 2+ bilateral radial pulses RESP: Clear to auscultation, normal work of breathing, no retractions/accessory muscle use ABD: Normoactive bowel sounds, nontender, no masses/hepatosplenomegaly EXT: Nontender, trace bilateral edema FACE:  No swelling   Impression & Recommendations:  Problem # 1:  EDEMA (ICD-782.3) Assessment Unchanged Check TSH, CBC.  Discussed low salt diet and gave handouts.  Orders: Urinalysis-[facility] (00000) [facility]- Est Level  3 ([address]) CBC-[facility] ([address]) TSH-[facility] ([address])  Complete Medication List: 1)  Loratadine 10 Mg  Tabs (Loratadine) .Marland Kitchen.. 1 tab by mouth daily 2)  Fosamax 70 Mg Tabs (Alendronate sodium) .Marland Kitchen.. 1 tab weekly 3)  Lipitor 80 Mg Tabs (Atorvastatin calcium) .... Take 1 tablet by mouth as directed 4)  Fluticasone Propionate 50 Mcg/act Susp (Fluticasone propionate) .... 2 sprays per nostril daily.  disp qs x1 month. 5)  Famotidine 20 Mg Tabs (Famotidine) .Marland Kitchen.. 1 tab by mouth two times a day 6)  Altace 5 Mg Tabs (Ramipril) .Marland Kitchen.. 1 tab by mouth daily 7)  Imdur 30 Mg Tb24 (Isosorbide mononitrate) .Marland Kitchen.. 1 tab by mouth daily 8)  Adult Aspirin Ec Low Strength 81 Mg Tbec (Aspirin) .Marland Kitchen.. 1 tab by mouth daily 9)  Hydrochlorothiazide 25 Mg Tabs (Hydrochlorothiazide) .Marland Kitchen.. 1 tab by mouth daily 10)  Trazodone Hcl 50 Mg Tabs (Trazodone hcl) .Marland Kitchen.. 1-2 tabs by mouth at bedtime as needed for insomnia  Patient Instructions: 1)  Let's try to really cut salt out of your diet for the next month--see handouts attached. 2)  Please schedule a follow-up appointment in 1 month.   Laboratory Results   Urine Tests  Date/Time Received: January 20, 2010 11:21 AM  Date/Time Reported: January 20, 2010 11:45 AM   Routine Urinalysis   Color: yellow Appearance: Clear Glucose: negative   (Normal Range: Negative) Bilirubin: negative   (Normal Range: Negative) Ketone: negative   (Normal Range: Negative) Spec. Gravity: 1.010   (Normal Range: 1.003-1.035) Blood: negative   (Normal Range: Negative) pH:  6.5   (Normal Range: 5.0-8.0) Protein: negative   (Normal Range: Negative) Urobilinogen: 0.2   (Normal Range: 0-1) Nitrite: negative   (Normal Range: Negative) Leukocyte Esterace: trace   (Normal Range: Negative)  Urine Microscopic WBC/HPF: 0-3 RBC/HPF: 0-3 Bacteria/HPF: trace Epithelial/HPF: 1-5    Comments: ...........test performed by...........Marland KitchenTerese Door, CMA

## 2010-11-09 NOTE — Miscellaneous (Signed)
Summary: Chart Summary     Impression & Recommendations:  Problem # 1:  HYPERLIPIDEMIA (ICD-272.4) Assessment Comment Only LDL elevated on maximum dose of Lipitor.  Admits to medication nonadherence. Her updated medication list for this problem includes:    Lipitor 80 Mg Tabs (Atorvastatin calcium) .Marland Kitchen... Take 1 tablet by mouth as directed  Labs Reviewed: SGOT: 15 (11/27/2009)   SGPT: 12 (11/27/2009)  Lipid Goals: Chol Goal: 200 (02/27/2007)   HDL Goal: 40 (02/27/2007)   LDL Goal: 100 (02/27/2007)   TG Goal: 150 (02/27/2007)  Prior 10 Yr Risk Heart Disease: N/A (02/27/2007)   HDL:53 (10/28/2008), 58 (03/30/2007)  LDL:202 (10/28/2008), 190 (03/30/2007)  Chol:269 (10/28/2008), 263 (03/30/2007)  Trig:69 (10/28/2008), 77 (03/30/2007)  Problem # 2:  HYPERTENSION, BENIGN ESSENTIAL (ICD-401.1) Assessment: Comment Only Elevated, admits to medication nonadherence. Her updated medication list for this problem includes:    Altace 5 Mg Tabs (Ramipril) .Marland Kitchen... 1 tab by mouth daily    Hydrochlorothiazide 25 Mg Tabs (Hydrochlorothiazide) .Marland Kitchen... 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 Elease Hashimoto. Her updated medication list for this problem includes:    Altace 5 Mg Tabs (Ramipril) .Marland Kitchen... 1 tab by mouth daily    Imdur 30 Mg Tb24 (Isosorbide mononitrate) .Marland Kitchen... 1 tab by mouth daily    Adult Aspirin Ec Low Strength 81 Mg Tbec (Aspirin) .Marland Kitchen... 1 tab by mouth daily    Hydrochlorothiazide 25 Mg Tabs (Hydrochlorothiazide) .Marland Kitchen... 1 tab by mouth daily  Complete Medication List: 1)  Loratadine 10 Mg Tabs (Loratadine) .Marland Kitchen.. 1 tab by mouth daily 2)  Fosamax 70 Mg Tabs (Alendronate sodium) .Marland Kitchen.. 1 tab weekly 3)   Lipitor 80 Mg Tabs (Atorvastatin calcium) .... Take 1 tablet by mouth as directed 4)  Fluticasone Propionate 50 Mcg/act Susp (Fluticasone propionate) .Marland Kitchen.. 1 spray per nostril daily 5)  Famotidine 20 Mg Tabs (Famotidine) .Marland Kitchen.. 1 tab by mouth two times a day 6)  Altace 5 Mg Tabs (Ramipril) .Marland Kitchen.. 1 tab by mouth daily 7)  Imdur 30 Mg Tb24 (Isosorbide mononitrate) .Marland Kitchen.. 1 tab by mouth daily 8)  Adult Aspirin Ec Low Strength 81 Mg Tbec (Aspirin) .Marland Kitchen.. 1 tab by mouth daily 9)  Hydrochlorothiazide 25 Mg Tabs (Hydrochlorothiazide) .Marland Kitchen.. 1 tab by mouth daily

## 2010-11-09 NOTE — Progress Notes (Signed)
Summary: triage  Phone Note Call from Patient Call back at [phone]   Caller: Patient Summary of Call: been dizzy for the last few days and wants to come in today Initial call taken by: De Nurse,  July 28, 2010 8:36 AM  Follow-up for Phone Call        has been out of meds for a few days. she is back on them now & the dizziness is a little bit better. still wants to be seen. she will be here before 10am for wi Follow-up by: Golden Circle RN,  July 28, 2010 8:44 AM

## 2010-11-09 NOTE — Assessment & Plan Note (Signed)
Summary: swelling face & hands,df   Vital Signs:  Patient profile:   77 year old female Height:      66 inches Weight:      173.4 pounds BMI:     28.09 Temp:     98.0 degrees F oral Pulse rate:   73 / minute BP sitting:   147 / 68  (left arm) Cuff size:   regular  Vitals Entered By: Garen Grams LPN (November 27, 2009 10:27 AM) CC: swelling in face and hands x 2 weeks Is Patient Diabetic? No Pain Assessment Patient in pain? no        Primary Care Provider:  Romero Belling MD  CC:  swelling in face and hands x 2 weeks.  History of Present Illness: 77 yo female with 2 week history of diffuse body swelling most notably in her hands and face.  Occurs mostly at night then resolves.  Unable to remove rings.  Currently no swelling.  Irregular heart beat, no known heart failure, history of stage 2 CKG, though has been normal x2 years.  Denies dyspnea, orthopnea, chest pain, endorses palpitations.  Habits & Providers  Alcohol-Tobacco-Diet     Tobacco Status: current  Allergies: 1)  ! Codeine 2)  Codeine Phosphate (Codeine Phosphate)  Physical Exam  General:  Well-developed,well-nourished,in no acute distress; alert,appropriate and cooperative throughout examination Head:  No facial edema Lungs:  Normal respiratory effort, chest expands symmetrically. Lungs are clear to auscultation, no crackles or wheezes. Heart:  Irregular heart rate, no m/r/g, 2+ bilateral radial pulses Extremities:  No edema of hands or legs. Additional Exam:  EKG reviewed.  Sinus rhythm with marked sinus arrhythmia.  Unchanged from prior.   Impression & Recommendations:  Problem # 1:  EDEMA (ICD-782.3) Assessment New Consider nephrotic syndrome, heart failure, low protein state.  Will check the following labs and get records from Dr. Elease Hashimoto (last echo in hospital system showed normal LVEF).  Will do trial of Lasix.  Call patient Monday after reviewing labs and Dr. Harvie Bridge  notes. Orders: Urinalysis-[facility] (00000) Comp Met-[facility] [id number]) B Nat Peptide-[facility] [id number]) [facility]- Est Level  3 ([address])  Complete Medication List: 1)  Loratadine 10 Mg Tabs (Loratadine) .Marland Kitchen.. 1 tab by mouth daily 2)  Fosamax 70 Mg Tabs (Alendronate sodium) .Marland Kitchen.. 1 tab weekly 3)  Lipitor 80 Mg Tabs (Atorvastatin calcium) .... Take 1 tablet by mouth as directed 4)  Fluticasone Propionate 50 Mcg/act Susp (Fluticasone propionate) .... 2 sprays per nostril daily.  disp qs x1 month. 5)  Famotidine 20 Mg Tabs (Famotidine) .Marland Kitchen.. 1 tab by mouth two times a day 6)  Altace 5 Mg Tabs (Ramipril) .Marland Kitchen.. 1 tab by mouth daily 7)  Imdur 30 Mg Tb24 (Isosorbide mononitrate) .Marland Kitchen.. 1 tab by mouth daily 8)  Adult Aspirin Ec Low Strength 81 Mg Tbec (Aspirin) .Marland Kitchen.. 1 tab by mouth daily 9)  Hydrochlorothiazide 25 Mg Tabs (Hydrochlorothiazide) .Marland Kitchen.. 1 tab by mouth daily 10)  Trazodone Hcl 50 Mg Tabs (Trazodone hcl) .Marland Kitchen.. 1-2 tabs by mouth at bedtime as needed for insomnia 11)  Furosemide 20 Mg Tabs (Furosemide) .... 1/2 tab by mouth daily, hold for dizziness  Other Orders: 12 Lead EKG (12 Lead EKG) Prescriptions: FUROSEMIDE 20 MG TABS (FUROSEMIDE) 1/2 tab by mouth daily, hold for dizziness  #15 x 0   Entered and Authorized by:   Romero Belling MD   Signed by:   Romero Belling MD on 11/27/2009   Method used:   [facility]  [address] [id number]       [address]       [place], [place]  [address]       Ph: [phone] or [phone]       Fax: [phone]   RxID:   [id number]   Appended Document: urine report    Lab Visit  Laboratory Results   Urine Tests  Date/Time Received: November 27, 2009 12:05 PM  Date/Time Reported: November 27, 2009 3:16 PM   Routine Urinalysis   Color: yellow Appearance: Clear Glucose: negative   (Normal Range: Negative) Bilirubin: negative   (Normal Range: Negative) Ketone: negative   (Normal Range: Negative) Spec.  Gravity: 1.015   (Normal Range: 1.003-1.035) Blood: negative   (Normal Range: Negative) pH: 7.0   (Normal Range: 5.0-8.0) Protein: negative   (Normal Range: Negative) Urobilinogen: 0.2   (Normal Range: 0-1) Nitrite: negative   (Normal Range: Negative) Leukocyte Esterace: trace   (Normal Range: Negative)  Urine Microscopic WBC/HPF: rare RBC/HPF: rare Bacteria/HPF: 1+ Epithelial/HPF: 0-2    Comments: ...............test performed by......Marland KitchenBonnie A. [place], MLS (ASCP)cm    Orders Today:

## 2010-11-09 NOTE — Letter (Signed)
Summary: Generic Letter  [facility]  [address]   [place], [place] [address]   Phone: [phone]  Fax: [phone]    08/26/2010  Cheryl Anderson [address] [place], [place]  [address]  Dear [name],   I would like to let you know that your cholesterol looks much better now than it did before.  YOur LDL (bad cholesterol) is now 118, down from 200.  Please keep up the good work and keep taking your medications and watching your diet to get it as low as possible.  Also, your kidney function is just slightly worse than at last check but within what it has been over the last several checks.  I would like you to avoid taking any ibuprofen (motrin, advil) or aleve (naprosen).    Please call my office with any questions.        Sincerely,   Ellery Plunk MD  Appended Document: Generic Letter letter mailed

## 2010-11-09 NOTE — Assessment & Plan Note (Signed)
Summary: hurt ankle,tcb   Vital Signs:  Patient profile:   77 year old female Weight:      172.7 pounds BMI:     27.98 Temp:     97.8 degrees F Pulse rate:   72 / minute BP sitting:   160 / 84  Vitals Entered By: Starleen Blue RN (January 26, 2010 1:57 PM) CC: left ankle injury Is Patient Diabetic? No Pain Assessment Patient in pain? yes     Location: L ankle Intensity: 6   Primary Care Provider:  Romero Belling MD  CC:  left ankle injury.  History of Present Illness: 77 yo here for evaluation of left ankle  2 days ago was lifting a wooden picnic table and dropped it on her lateral left ankle.  Since that time has had increasing swelling, now difficult to bear weight.  Has not been taking any meds or doing any supportive care for it.  No history of previous ankle injury.  Habits & Providers  Alcohol-Tobacco-Diet     Tobacco Status: current     Cigarette Packs/Day: 1.0  Allergies: 1)  ! Codeine 2)  Codeine Phosphate (Codeine Phosphate) PMH-FH-SH reviewed-no changes except otherwise noted  Social History: Widowed, lives with daughter and daughter's two children; >50 pack years; no etoh; no drug use; enjoys gardening, cooking, crafts; [place] [employer] LightsPacks/Day:  1.0  Review of Systems      See HPI General:  Denies fever. MS:  Complains of joint pain, joint redness, and joint swelling; denies loss of strength.  Physical Exam  General:  Well-developed,well-nourished,in no acute distress; alert,appropriate and cooperative throughout examination.  Here today with granddaughter. Msk:  decreased range of motion both actively and passively due to pain.  Superficial cut located over left lateral malleolus.  Left malleolus painful to palpate.  Significant erythema and edema overlying left malleolus and surrounding area with some mild dependent bruising over anterior ankle.  Neurovascularly intact.   Impression & Recommendations:  Problem # 1:  ANKLE PAIN, LEFT  (ICD-719.47) Suspect fracture.  Will get AP/Lateral/Mortise veiws of ankle today.  Patient declines crutches.  Advised non-weightbearing- wrapped in ACE bandage.  Discussed supportive care: RICE.  Decided to start Keflex 500 mg two times a day x 5 days for warmth and erythema of ankle as possibel early cellulitis.  WIll get udpate with TDAP as never had it previously and last TD > 5 years ago.  WIll call patient at [phone] with xray results and make referral as necessary.    Orders: Diagnostic X-Ray/Fluoroscopy (Diagnostic X-Ray/Flu) [facility]- Est Level  3 (41324)  Complete Medication List: 1)  Loratadine 10 Mg Tabs (Loratadine) .Marland Kitchen.. 1 tab by mouth daily 2)  Fosamax 70 Mg Tabs (Alendronate sodium) .Marland Kitchen.. 1 tab weekly 3)  Lipitor 80 Mg Tabs (Atorvastatin calcium) .... Take 1 tablet by mouth as directed 4)  Fluticasone Propionate 50 Mcg/act Susp (Fluticasone propionate) .... 2 sprays per nostril daily.  disp qs x1 month. 5)  Famotidine 20 Mg Tabs (Famotidine) .Marland Kitchen.. 1 tab by mouth two times a day 6)  Altace 5 Mg Tabs (Ramipril) .Marland Kitchen.. 1 tab by mouth daily 7)  Imdur 30 Mg Tb24 (Isosorbide mononitrate) .Marland Kitchen.. 1 tab by mouth daily 8)  Adult Aspirin Ec Low Strength 81 Mg Tbec (Aspirin) .Marland Kitchen.. 1 tab by mouth daily 9)  Hydrochlorothiazide 25 Mg Tabs (Hydrochlorothiazide) .Marland Kitchen.. 1 tab by mouth daily 10)  Trazodone Hcl 50 Mg Tabs (Trazodone hcl) .Marland Kitchen.. 1-2 tabs by mouth at bedtime as needed for  insomnia 11)  Keflex 500 Mg Caps (Cephalexin) .... Take one tablet twice a day for 5 days  Other Orders: Tdap => 23yrs IM (38756) Admin 1st Vaccine (43329) Admin 1st Vaccine [facility]) [phone])  Patient Instructions: 1)  will get xray of foot. 2)  Will get TDAP today 3)  Will start antibiotics 4)  will give you a call with xray results Prescriptions: KEFLEX 500 MG CAPS (CEPHALEXIN) take one tablet twice a day for 5 days  #10 x 0   Entered and Authorized by:   Delbert Harness MD   Signed by:   Starleen Blue RN on 01/26/2010    Method used:   [facility]ron[facility] to        [id number]       [address]       [place], [place]  [address]       Ph: [phone] or [phone]       Fax: [phone]   RxID:   [id number]   Last TD:  Done. (04/09/2002 12:00:00 AM) TD Result Date:  01/26/2010 TD Result:  TDAP TD Next Due:  10 yr   Tetanus/Td Vaccine    Vaccine Type: Tdap    Site: left deltoid    Mfr: GlaxoSmithKline    Dose: 0.5 ml    Route: IM    Given by: Starleen Blue RN    Exp. Date: 01/02/2012    Lot #: [id number]    VIS given: 08/28/07 version given January 26, 2010.   Appended Document: No fracture on xray Discussed results with patient.  No fracture.   Will prescribe ibuprofen for pain.  No further questions. Delbert Harness MD  January 26, 2010 5:14 PM    Clinical Lists Changes  Medications: Added new medication of IBUPROFEN 600 MG TABS (IBUPROFEN) one tablet every 6 hours as needed for ankle pain - Signed Rx of IBUPROFEN 600 MG TABS (IBUPROFEN) one tablet every 6 hours as needed for ankle pain;  #40 x 0;  Signed;  Entered by: Delbert Harness MD;  Authorized by: Delbert Harness MD;  Method used: [facility]  [id number] [address] [place], [place], [place]  [address], Ph: [phone] or [phone], Fax: [phone]    Prescriptions: IBUPROFEN 600 MG TABS (IBUPROFEN) one tablet every 6 hours as needed for ankle pain  #40 x 0   Entered and Authorized by:   Delbert Harness MD   Signed by:   Delbert Harness MD on 01/26/2010   Method used:   [facility]        [facility]  [address] [id number]       [address]       [place], [place]  [address]       Ph: [phone] or [phone]       Fax: [phone]   RxID:   [id number]

## 2010-11-09 NOTE — Miscellaneous (Signed)
Summary: PT report  patient only made one visit to PT for ankle and decided to self D/C from program.   Ancil Boozer  MD  March 10, 2010 8:33 AM   Clinical Lists Changes

## 2010-11-11 NOTE — Letter (Signed)
Summary: Generic Letter  [facility]  [address]   [place], [place] [address]   Phone: [phone]  Fax: [phone]    11/03/2010  [address] [place], [place]  [address]  Dear Ms. Killough,  We are happy to let you know that since you are covered under Medicare you are able to have a FREE visit at the [facility] to discuss your HEALTH. This is a new benefit for Medicare.  There will be no co-payment.  At this visit you will meet with Arlys John an expert in wellness and the health coach at our clinic.  At this visit we will discuss ways to keep you healthy and feeling well.  This visit will not replace your regular doctor visit and we cannot refill medications.     You will need to plan to be here at least one hour to talk about your medical history, your current status, review all of your medications, and discuss your future plans for your health.  This information will be entered into your record for your doctor to have and review.  If you are interested in staying healthy, this type of visit can help.  Please call the office at: [phone], to schedule a "Medicare Wellness Visit".  The day of the visit you should bring in all of your medications, including any vitamins, herbs, over the counter products you take.  Make a list of all the other doctors that you see, so we know who they are. If you have any other health documents please bring them.  We look forward to helping you stay healthy.  Sincerely,   [facility]  iAWV

## 2010-12-14 ENCOUNTER — Ambulatory Visit (INDEPENDENT_AMBULATORY_CARE_PROVIDER_SITE_OTHER): Payer: Medicare Other | Admitting: Family Medicine

## 2010-12-14 ENCOUNTER — Encounter: Payer: Self-pay | Admitting: Family Medicine

## 2010-12-14 VITALS — BP 148/64 | HR 72 | Temp 98.2°F | Wt 165.3 lb

## 2010-12-14 DIAGNOSIS — R222 Localized swelling, mass and lump, trunk: Secondary | ICD-10-CM

## 2010-12-14 DIAGNOSIS — R229 Localized swelling, mass and lump, unspecified: Secondary | ICD-10-CM

## 2010-12-14 DIAGNOSIS — Z201 Contact with and (suspected) exposure to tuberculosis: Secondary | ICD-10-CM

## 2010-12-14 NOTE — Patient Instructions (Addendum)
We will call you with an appointment so that a surgeon can look at that knot on your neck. If you notice it hurting or getting bigger or if you have fever or weight loss come back in to be seen.  Follow up with Dr. Hulen Luster in about 6 weeks

## 2010-12-15 ENCOUNTER — Encounter: Payer: Self-pay | Admitting: Family Medicine

## 2010-12-15 DIAGNOSIS — R222 Localized swelling, mass and lump, trunk: Secondary | ICD-10-CM | POA: Insufficient documentation

## 2010-12-15 HISTORY — DX: Localized swelling, mass and lump, trunk: R22.2

## 2010-12-15 NOTE — Progress Notes (Signed)
  Subjective:    Patient ID: Cheryl Anderson, female    DOB: [birth year], 77 y.o.   MRN: [id number]  HPI 1) "Knot" on chest: Noticed one week ago in mirror, just under left collarbone near the middle of her chest. Mildly painful at times with palpation, but not continuously painful. Has never had anything like this before. Has not changed in size since she first noticed it. Able to "move it around under her skin". Has not drained anything. No overlying skin changes reported. No personal history of cancer.     Review of Systems Denies trauma, fever, chills, sore throat, URI symptoms, nausea, emesis, neck pain, heat / cold intolerance, weight change, difficulty swallowing or breathing, breast pain or discharge, axillary lymphadenopathy, skin changes, rash, lethargy / malaise, muscle aches, presyncope, neck pain or swelling.     Objective:   Physical Exam  Constitutional: She appears well-developed and well-nourished. No distress.  HENT:  Mouth/Throat: Oropharynx is clear and moist.  Neck: Normal range of motion. Neck supple. No tracheal deviation present. No thyromegaly present.  Cardiovascular: Normal rate, regular rhythm and normal heart sounds.   Pulmonary/Chest: Effort normal and breath sounds normal. No stridor. No respiratory distress. She has no wheezes. She has no rales. She exhibits mass. She exhibits no tenderness. Right breast exhibits no inverted nipple, no mass, no nipple discharge, no skin change and no tenderness. Left breast exhibits no inverted nipple, no mass, no nipple discharge, no skin change and no tenderness. Breasts are symmetrical.       2 cm x 2 cm subcutaneous, non tender, rubbery, mobile nodule immediately inferolateral to left sternoclavicular joint without overlying skin changes  Lymphadenopathy:       Head (right side): No submental, no submandibular, no tonsillar, no preauricular, no posterior auricular and no occipital adenopathy present.       Head (left side): No  submental, no submandibular, no tonsillar, no preauricular, no posterior auricular and no occipital adenopathy present.    She has no cervical adenopathy.    She has no axillary adenopathy.       Right: No supraclavicular adenopathy present.       Left: No supraclavicular adenopathy present.          Assessment & Plan:

## 2010-12-15 NOTE — Assessment & Plan Note (Addendum)
New. Possibly lipomatous tissue, however given location would be concerned about lymph node (admittedly not supraclavicular in location). Does not appear to be extension of thyroid. Normal breast exam. Will refer to surgery for further evaluation and possible biopsy by FNA. Follow up with PCP in 6 weeks.

## 2010-12-16 ENCOUNTER — Ambulatory Visit (INDEPENDENT_AMBULATORY_CARE_PROVIDER_SITE_OTHER): Payer: Medicare Other | Admitting: *Deleted

## 2010-12-16 DIAGNOSIS — R7611 Nonspecific reaction to tuberculin skin test without active tuberculosis: Secondary | ICD-10-CM

## 2010-12-16 LAB — TB SKIN TEST
Induration: 30
TB Skin Test: POSITIVE mm

## 2010-12-16 NOTE — Progress Notes (Signed)
Patient in to read PPD with positive results of 30 mm X 30 mm. Dr. Leveda Anna  verified results. Patient referred to [facility]. Results and demographics faxed to Health Dept. Spoke with Tammy in TB Control and she will call patient to set up appointment.

## 2010-12-17 ENCOUNTER — Ambulatory Visit
Admission: RE | Admit: 2010-12-17 | Discharge: 2010-12-17 | Disposition: A | Discharge status: Home / Self Care | Payer: Self-pay | Source: Ambulatory Visit | Attending: *Deleted | Admitting: *Deleted

## 2010-12-17 ENCOUNTER — Other Ambulatory Visit: Payer: Self-pay | Admitting: *Deleted

## 2010-12-17 DIAGNOSIS — R7611 Nonspecific reaction to tuberculin skin test without active tuberculosis: Secondary | ICD-10-CM

## 2011-02-01 ENCOUNTER — Other Ambulatory Visit: Payer: Self-pay | Admitting: Cardiology

## 2011-02-01 DIAGNOSIS — I10 Essential (primary) hypertension: Secondary | ICD-10-CM

## 2011-02-01 NOTE — Telephone Encounter (Signed)
escribe medication per fax request  

## 2011-02-04 ENCOUNTER — Ambulatory Visit: Payer: No Typology Code available for payment source | Admitting: Family Medicine

## 2011-02-10 ENCOUNTER — Encounter: Payer: Self-pay | Admitting: Family Medicine

## 2011-02-10 ENCOUNTER — Ambulatory Visit (INDEPENDENT_AMBULATORY_CARE_PROVIDER_SITE_OTHER): Payer: Medicare Other | Admitting: Family Medicine

## 2011-02-10 VITALS — BP 133/65 | HR 85 | Temp 98.4°F | Ht 66.0 in | Wt 159.1 lb

## 2011-02-10 DIAGNOSIS — M25551 Pain in right hip: Secondary | ICD-10-CM

## 2011-02-10 DIAGNOSIS — J309 Allergic rhinitis, unspecified: Secondary | ICD-10-CM

## 2011-02-10 DIAGNOSIS — F17200 Nicotine dependence, unspecified, uncomplicated: Secondary | ICD-10-CM

## 2011-02-10 DIAGNOSIS — M25559 Pain in unspecified hip: Secondary | ICD-10-CM

## 2011-02-10 DIAGNOSIS — R7611 Nonspecific reaction to tuberculin skin test without active tuberculosis: Secondary | ICD-10-CM

## 2011-02-10 LAB — POCT URINALYSIS DIPSTICK
Glucose, UA: NEGATIVE
Nitrite, UA: NEGATIVE
Protein, UA: NEGATIVE
Spec Grav, UA: 1.01
Urobilinogen, UA: 0.2

## 2011-02-10 LAB — POCT UA - MICROSCOPIC ONLY

## 2011-02-10 MED ORDER — LORATADINE 10 MG PO TABS: 10.0000 mg | ORAL_TABLET | Freq: Every day | ORAL | Status: DC

## 2011-02-10 MED ORDER — BENZONATATE 100 MG PO CAPS: 100.0000 mg | ORAL_CAPSULE | Freq: Four times a day (QID) | ORAL | Status: DC | PRN

## 2011-02-10 NOTE — Patient Instructions (Signed)
Since you don't have symptoms of TB and the health department was ok with not treating you, I think we should decide not to treat However: If you start to notice night sweats, weight loss, fatigue, or have other symptoms that worry you, you should come in to be seen We will probably get yearly chest xrays

## 2011-02-12 LAB — URINE CULTURE: Colony Count: NO GROWTH

## 2011-02-13 ENCOUNTER — Encounter: Payer: Self-pay | Admitting: Family Medicine

## 2011-02-13 DIAGNOSIS — M25551 Pain in right hip: Secondary | ICD-10-CM | POA: Insufficient documentation

## 2011-02-13 NOTE — Assessment & Plan Note (Signed)
Discussed readiness for change.  Pt is precontemplation.  Daughter is not very helpful with resolving to quit.  Will keep discussing with pt.

## 2011-02-13 NOTE — Assessment & Plan Note (Signed)
asked pt to RTC if symptoms return. Location fits with sciatica

## 2011-02-13 NOTE — Progress Notes (Signed)
  Subjective:    Patient ID: Cheryl Anderson, female    DOB: 05-Aug-1929, 77 y.o.   MRN: [id number]  HPI Cough- cough, congestion, sneezing x 1 week.  No fever, no itchy watery eyes.  Congestion better but now still has cough.    R hip-- 2 weeks ago had a pain on right side that was radiating down right leg to right calf.  No injury.  It hurt to walk for that time period.  Lasted 3 days and went away on its own.  Today has no complaints  Ppd- pt had ppd test at HD.  She reports a positive and according to our records it was 30mm.  She states that the HD told her that she did not have to be treated and that it was up to her if she wanted to take the abx.  She denies fever, chest pain, chronic cough, weight loss, night sweats.  She does not want to be treated at this time.  Her reason for testing was that her friend tested positive.  She was not exposed to the person her friend was exposed to.  She has no known exposure.   Review of Systems See above    Objective:   Physical Exam    Vital signs reviewed General appearance - alert, well appearing, and in no distress and oriented to person, place, and time Heart - normal rate, regular rhythm, normal S1, S2, no murmurs, rubs, clicks or gallops Chest - clear to auscultation, no wheezes, rales or rhonchi, symmetric air entry, no tachypnea, retractions or cyanosis Eyes - pupils equal and reactive, extraocular eye movements intact, sclera anicteric Ears - bilateral TM's and external ear canals normal, right ear normal, left ear normal Nose - normal and patent, no erythema, discharge or polyps Mouth - mucous membranes moist, pharynx normal without lesions MSK- full ROM at back and hip without eliciting pain     Assessment & Plan:   Cough- likely lingering URI symptoms.  Tessalon perles for some cough symptomatic relief  Hip pain- asked pt to RTC if symptoms return.  Location fits with sciatica  Positive ppd- precepted with Dr. Sheffield Slider.  Decided  not to treat unless she develops symptoms.  Will follow her in 3 months to check in with symptoms, will follow with yearly cxr.

## 2011-02-25 NOTE — Discharge Summary (Signed)
Cheryl Anderson, Cheryl Anderson                       ACCOUNT NO.:  [id number]   MEDICAL RECORD NO.:  [id number]                   PATIENT TYPE:  INP   LOCATION:  4727                                 FACILITY:  [facility]   PHYSICIAN:  Leighton Roach McDiarmid, M.D.             DATE OF BIRTH:  [birth year]   DATE OF ADMISSION:  01/20/2003  DATE OF DISCHARGE:  01/22/2003                                 DISCHARGE SUMMARY   PROCEDURES:  Cardiac catheterization.   DISCHARGE DIAGNOSES:  1. Coronary vasospasm.  2. Unstable angina.  3. Osteoporosis.  4. Tobacco abuse.  5. Hypercholesterolemia.  6. History of chronic bronchitis.   PERIPHERAL HISTORY AND PHYSICAL:  Please see admission H&P but briefly this  is a 77 year old African American female with new onset chest pain today  that was worse then her chronic angina.  It occurred essentially at rest  with no resolution with rest.  It lasted 15 minutes.  Patient has a history  of angina x 6-9 months with exertion but usually relieved by rest.  Today's  episode of angina occurred substernally with radiation down her left arm.  The patient also noted some diaphoresis, nausea but no shortness of breath.  This is typical in presentation and location of her angina but just more  prolonged and not relieved with rest.  Patient has had a history of  indigestion as well that is relieved with antacids.  Patient presented to  the ER with 8 out of 10 chest pain.  She was given three sublingual  nitroglycerin and the chest pain was not relieved but it had decreased to  approximately a 3 out of 10.  At that point in time the chest pain had been  persistent for over 30 minutes.  Patient's initial vitals were temperature  97.6, blood pressure 155/80, pulse 64, respirations 20, 98% on room air.   ADMISSION DIAGNOSIS:  Patient was admitted with the following diagnosis:  unstable angina.  The patient presents now with more prolonged, more  intense, unremitting unstable  angina as compared to her chronic stable  angina.   LABORATORY DATA:  EKG patient had normal sinus rhythm at 69 beats per minute  with T wave inversion in aVL, leads V1-V2.   The first set of cardiac enzymes obtained in the lab in the ER had a CK of  219, MB 5.9, relative index 5.7, troponin 0.02.   Given the patient had unstable angina with some EKG changes and mildly  elevated enzymes, cardiology was consulted.  Dr. Elease Hashimoto saw the patient and  agreed to start the patient on a heparin drip, nitro drip, give the patient  beta-blocker, aspirin, and start her on a dose of Integrelin.  Patient  throughout the night had next set of enzymes bumped to CK of 122, MB of 4.5,  relative index 3.7 and troponin 0.13.  Further enzymes, the last set, showed  an increase of troponin  to 0.32 with normal  total CK.   Patient was taken to cardiac catheterization on hospital day number two at  which point in time very mild to moderate coronary artery irregularities  were seen.  No discrete stenosis was seen that would cause any degree of  ischemia.  Patient did have mildly to moderately reduced LV systolic  function with hypokinesis of the mid segment of the entire inferior anterior  walls which were attributed to diffuse coronary vasospasm.  Basilar regions  and apical segments of the left ventricle contracted normally.  Patient  remained chest pain free following cardiac procedure and tolerated the  procedure very well.   Patient was seen the following day and was asymptomatic.   HOSPITAL COURSE:  1. Unstable angina.  It was felt at this point in time in discussion with     Dr. Elease Hashimoto the patient would benefit from Imdur, calcium channel blocker,     aspirin and ACE at this point and holding her beta-blocker secondary to     bradycardia and history of chronic obstructive pulmonary disease.     Patient will follow up with Dr. Elease Hashimoto on Friday, April 23rd at 3:30 p.m.     for follow up visit.   Patient will need repeat echo at some point in time     to determine if LV function continues to decline or remains stable.     Patient was also given sublingual nitroglycerin prescription and     instructed to use one sublingual nitroglycerin pill every five minutes     for chest pain episodes times three doses before return to the ER.  2. Tobacco abuse.  Patient has a long-time history of tobacco abuse.     Smoking cessation consult was ordered and the patient currently still     smokes about a pack per day.  Patient is interested in quitting at this     point in time secondary to avoiding the episode of chest pain.  Patient     is interested in a nicotine patch for her smoking cessation but is going     to have difficulty affording the patch, will bring up at hospital follow     up visit with doctor Lorne Skeens, her primary care doctor, whom she will     see Wednesday April 21 at 3:45 p.m.  3. Osteoporosis.  Patient has a history of increased T-score and negative     2.24.  Patient is not taking her Fosamax.  She is taking calcium     supplements twice a day but cannot afford her medications.  Patient was     given a _______ management consult for which she was given Med-Assistance     Programs to be filled out and followed up at her own point in time.  4. Increased cholesterol.  Patient has a history of elevated cholesterol as     documented in the past.  The patient will be started on Lipitor 40 mg     daily and was given Med-Assistance papers for this Lipitor.  Lipid panel     was not assessed while the patient was an inpatient.  The patient will     follow up with appointments noted above.  She is discharged on 01/22/03     in stable and improved condition.   DISCHARGE MEDICATIONS:  1. Altace 2.5 mg p.o. daily.  2. Aspirin 81 mg p.o. daily.  3. Imdur 30 mg p.o. daily.  4. Norvasc  2.5 mg p.o. daily. 5. Nitroglycerin 0.4 mg one tab sublingual every five minutes times three      doses before calling EMS.  6. Lipitor 40 mg one tab daily.   DISCHARGE INSTRUCTIONS:  1. Patient is to be maintained on a low-salt diet.  2.     Patient is to follow up with Dr. Lorne Skeens on Wednesday, April 21 at 3:45      p.m.  3. Patient is to follow up with Dr. Elease Hashimoto of [facility] cardiology on     Friday, April 23 at 3:30 p.m. phone number [phone].       Alvira Philips, M.D.                      Etta Grandchild, M.D.    RM/MEDQ  D:  01/22/2003  T:  01/22/2003  Job:  [id number]   cc:   Vesta Mixer, M.D.  [address], [address]  [place]  [place] [address]  Fax: [phone]   Lorne Skeens, D.O.  [facility].  Family Prac. Resident  [place], [place] [address]  Fax: [phone]

## 2011-02-25 NOTE — Consult Note (Signed)
Cheryl Anderson, Cheryl Anderson                       ACCOUNT NO.:  [id number]   MEDICAL RECORD NO.:  [id number]                   PATIENT TYPE:  INP   LOCATION:  1826                                 FACILITY:  [facility]   PHYSICIAN:  Vesta Mixer, M.D.              DATE OF BIRTH:  06-13-1929   DATE OF CONSULTATION:  01/20/2003  DATE OF DISCHARGE:                                   CONSULTATION   REFERRING PHYSICIAN:  Asencion Partridge, M.D.   REASON FOR CONSULTATION:  The patient is a 77 year old female with a history  of hypercholesterolemia, cigarette smoking and gastric ulcers.  She is  admitted to the hospital with episodes of chest pain.   HISTORY OF PRESENT ILLNESS:  The patient has been reporting episodes of  chest pain for the past several months.  These episodes of pain have been  gradually worsening.  She has had lots of pain while taking out the garbage  and other minimal activities.  Today, she was arguing with her granddaughter  and started having severe episodes of chest pain.  She was brought into the  hospital by EMS.  The pain was only partially relieved with three sublingual  nitroglycerins.  Now that she is here in the emergency room, her pain is  largely relieved with IV nitroglycerin and IV heparin.  She is now admitted  for further evaluation.   CURRENT MEDICATIONS:  None.  She used to be on Lipitor but quit taking it  because of the expense.   ALLERGIES:  She has no known allergies.   PAST MEDICAL HISTORY:  1. Hypercholesterolemia.  2. Peptic ulcer disease.   SOCIAL HISTORY:  The patient smokes one pack of cigarettes a day.   FAMILY HISTORY:  Family history is consistent with hypertension.   REVIEW OF SYSTEMS:  Review of systems is negative.   PHYSICAL EXAMINATION:  GENERAL:  She is an elderly black female in no acute  distress.  She is almost pain-free.  VITAL SIGNS:  Her blood pressure is 155/80 with a heart rate of 64.  HEENT/NECK:  Exam reveals 2+  carotids.  She has no bruits.  There is no JVD  and no thyromegaly.  LUNGS:  Lungs are clear to auscultation.  HEART:  Regular rate.  S1 and S2.  She has no murmurs, gallops or rubs.  ABDOMEN:  Exam revealed good bowel sounds and is nontender.  EXTREMITIES:  She has no clubbing, cyanosis, or edema.  NEUROLOGIC:  Exam is nonfocal.   LABORATORY AND ACCESSORY CLINICAL DATA:  Her EKG reveals normal sinus  rhythm.  She has T wave inversions in the lateral leads.   Her CPKs are mildly elevated.   IMPRESSION:  The patient presents with symptoms consistent with unstable  angina.  Her chest pain has been progressing over the past several months.  We will place her on Integrilin.  We will continue with the intravenous  nitroglycerin and intravenous heparin.  We will anticipate doing a heart  catheterization tomorrow.  We will continue to collect cardiac enzymes.                                               Vesta Mixer, M.D.    PJN/MEDQ  D:  01/20/2003  T:  01/21/2003  Job:  [id number]   cc:   Lorne Skeens, D.O.  [facility].  Family Prac. Resident  [place], [place] [address]  Fax: [phone]

## 2011-02-25 NOTE — Discharge Summary (Signed)
NAMEJENNEL, Cheryl Anderson                       ACCOUNT NO.:  [id number]   MEDICAL RECORD NO.:  [id number]                   PATIENT TYPE:  INP   LOCATION:  3729                                 FACILITY:  [facility]   PHYSICIAN:  Asencion Partridge, M.D.                  DATE OF BIRTH:  1929-06-13   DATE OF ADMISSION:  09/25/2003  DATE OF DISCHARGE:  09/27/2003                                 DISCHARGE SUMMARY   DISCHARGE DIAGNOSES:  1. Chest pain, questionable Prinzmetal's angina.  2. Atherosclerosis.  3. Pulmonary hypertension.  4. Hypercholesterolemia.  5. Chronic obstructive pulmonary disease.  6. Bradycardia.   PROCEDURES PERFORMED:  1. Carotid Doppler.  2. A 2-D echocardiogram.   CONSULTATIONS:  None.   DISCHARGE MEDICATIONS:  1. Altace 2.5 mg 1 tablet daily.  2. Aspirin 81 mg 1 tablet daily.  3. Imdur 30 mg 1 tablet daily.  4. Lipitor 80 mg 1 tablet q.h.s.  5. Norvasc 2.5 mg daily.  6. Protonix 40 mg daily.  7. Nitroglycerin 0.4 mg sublingual every 5 minutes x3 p.r.n. with     instructions to call 911 if needing three tablets.   FOLLOWUP:  An appointment with Dr. Elease Hashimoto on October 13, 2003 to discuss  recurrent chest pain, question Prinzmetal's angina as well as sinus  bradycardia.  Question tachy brady syndrome and possibly needing a  pacemaker.  Kept her off beta blockers, as she had a response of sinus  bradycardia with previous admission in April, 2004.   HOSPITAL COURSE:  Ms. Malay is a 77 year old African-American female with  a long history of tobacco abuse, COPD, on no medications, osteoporosis, and  unstable angina.  She was admitted in April, 2004 for chest pain and  received a cardiac catheterization at that time that showed mild-to-moderate  LV systolic function with hypokinesis of the mid inferior and anterior  walls.  She also had coronary vasospasm and no discrete stenosis.  She was  medically managed with Altace, Imdur, aspirin, and nitroglycerin p.r.n.  Norvasc was added for her hypertension fairly recently.  On admission, she  was complaining of left-sided chest pain while at rest.  Her pain was a 6/10  in severity associated with left arm numbness and heaviness.  She also had  associated dyspnea but no nausea or diaphoresis.  She stated she had some  numbness in her left leg and weakness during this episode.  She took one  sublingual nitro with some relief and then called 911.  She was given 325 mg  of aspirin while in the ER, and chest pain resolved upon admission.  She was  placed on a nitro drip on admission and ruled out for acute cardiac event.  She was also kept on her home meds of aspirin, ACE, and was added  supplemental O2.  Her EKG was reassuring and unchanged from previous.  She  was given a proton pump inhibitor for  any GI causes of chest pain.   Problem #1:  For her chest pain, as stated above, an EKG was performed that  did not show any change above normal.  Cardiac enzymes remained negative x3.  She was admitted to the telemetry floor and had no cardiac arrhythmia.  She  received nitro drip on her first hospital day and was weaned off.  She has  not needed any p.r.n. sublingual nitroglycerin and remains chest pain-free.  A 2D echocardiogram was performed on December 17, which showed LV ejection  fraction of 55-65% with no wall motion abnormalities.  She had trace  tricuspid regurg and minimal pericardial effusion without hemodynamic  compromise.  She was counseled on smoking cessation and is to follow up with  Dr. Elease Hashimoto at her next scheduled appointment.   Problem #2:  Neuro symptoms of weakness and numbness in her extremities with  a nonfocal neurologic exam.  Question whether she had a completely resolved  TIA.  Received carotid Doppler studies which showed bilateral intimal wall  thickening throughout and no significant bilateral ICA stenosis.  She did  have vertebral artery flow that was antegrade and left vertebral  that  exhibited no diastolic flow.  On discharge, she has nonfocal neurological  exam.  She is able to ambulate without gait ataxia.   Problem #3:  Sinus bradycardia:  During her hospital, her air remained in  the 50s to 60s.  She remained asymptomatic without syncope, near syncope,  dyspnea with exertion, dizziness, or lightheadedness.  It was noted that on  previous admission, she was started on a beta blocker and was stopped due to  COPD exacerbation and sinus bradycardia.  She is currently not on any beta  blockers and only on Norvasc for calcium channel blockade.  As she was  asymptomatic and has a heart rate of 58 on discharge, we will send her home.  She may possibly need a Holter monitor by Dr. Elease Hashimoto as an outpatient if  this does continue.   Problem #4:  Tobacco use:  Counseled her extensively on smoking cessation in  the outpatient setting as well as inpatient.  The patient does understand  the need to quit smoking.  She currently does not desire Nicoderm CQ patch  or any other aides.  We will see her back in clinic in two weeks to discuss  the importance of her smoking cessation, if she has not already quit.  She  is already showing signs of early pulmonary hypertension with elevated PA  systolic pressures of 31.  She is currently not on any bronchodilators or  inhaled steroids.  Her lungs remained clear, and she was not short of breath  or hypoxic.  Will continue to follow her process.   Problem #5:  Hypercholesterolemia:  Fasting lipid panels obtained during her  stay showing a total cholesterol of 203, triglycerides 51, HDL 54, and an  LDL of 139.  She does admit to being noncompliant with her Lipitor.  I have  discussed with her the importance of taking this, as she may start  developing coronary artery disease in addition to her likely Prinzmetal's  angina.  She is already showing atherosclerosis in her carotid arteries. Gave her recommendations for a low cholesterol  diet.  Will continue her  Lipitor 80 mg q.h.s. and follow LDLs as an outpatient.      Lorne Skeens, D.O.  Asencion Partridge, M.D.    Erick Alley  D:  09/27/2003  T:  09/28/2003  Job:  [id number]   cc:   Vesta Mixer, M.D.  [address], [address]  [place]  [place] [address]  Fax: [phone]

## 2011-02-25 NOTE — Cardiovascular Report (Signed)
NAMEMACIL, CRADY                       ACCOUNT NO.:  [id number]   MEDICAL RECORD NO.:  [id number]                   PATIENT TYPE:  INP   LOCATION:  4727                                 FACILITY:  [facility]   PHYSICIAN:  Vesta Mixer, M.D.              DATE OF BIRTH:  [birth year]   DATE OF PROCEDURE:  01/21/2003  DATE OF DISCHARGE:                              CARDIAC CATHETERIZATION   PROCEDURE PERFORMED:  Cardiac catheterization.   CARDIOLOGIST:  Vesta Mixer, M.D.   INDICATIONS FOR PROCEDURE:  The patient is a 77 year old female with a  history of hypertension and cigarette smoking.  She was admitted last night  with episodes of chest pain and mildly abnormal troponin levels.  This  morning she had further increase in her troponin levels.  She is referred  for a heart catheterization.   DESCRIPTION OF PROCEDURE:  The right femoral artery was easily cannulated  using the modified Seldinger technique.   RESULTS:   HEMODYNAMICS:  The LV pressure was 130/10 with an aortic pressure of 133/65.   ANGIOGRAPHY:  Left Main Coronary Artery:  The left main coronary artery was  fairly smooth and normal.   Left Anterior Descending Artery:  The left anterior descending artery  revealed minor luminal irregularities.  There are no critical stenoses.  There is a 30% stenosis in the distal LAD. The first diagonal vessel is a  fairly small vessel.  There is a 60% stenosis at the origin. The left  circumflex artery is large and normal. The fist obtuse marginal artery is  fairly large.  There are minor luminal irregularities.   Right Coronary Artery:  The right coronary artery has mild-to-moderate  irregularities.  There is a mid 30-40% stenosis.  The remainder of the RCA  is fairly normal.   LEFT VENTRICULOGRAM:  The left ventriculogram is performed in the 30 RAO  position.  It reveals mildly-to-moderately reduced left ventricular systolic  function.  There is severe hypokinesis  of the mid regions of both the  anterior wall and inferior wall.  The basilar segments as well as the apical  segments contract fairly normally.  This diffuse spasm does not follow any  specific anatomic distribution.  It appears to be most consistent with  diffuse coronary spasm.   COMPLICATIONS:  None.   CONCLUSIONS:  1. Mild-to-moderate coronary artery irregularities. There are no discrete     stenoses that appear to be causing any ischemia.  2.     The patient's left ventricular systolic function is mildly-to-moderately     reduced.  She has hypokinesis of the mid segments of all the walls, which     appears to be due to diffuse coronary spasm.   PLAN:  We will continue with medical therapy.  She will also need counseling  on smoking cessation.  Vesta Mixer, M.D.    PJN/MEDQ  D:  01/21/2003  T:  01/21/2003  Job:  [id number]   cc:   Asencion Partridge, M.D.  [address] [place]  [place] [address]  Fax: [phone]

## 2011-03-14 ENCOUNTER — Encounter (INDEPENDENT_AMBULATORY_CARE_PROVIDER_SITE_OTHER): Payer: Self-pay | Admitting: General Surgery

## 2011-04-06 ENCOUNTER — Other Ambulatory Visit: Payer: Self-pay | Admitting: Cardiovascular Disease

## 2011-05-05 ENCOUNTER — Ambulatory Visit (INDEPENDENT_AMBULATORY_CARE_PROVIDER_SITE_OTHER): Payer: Medicare Other | Admitting: Family Medicine

## 2011-05-05 ENCOUNTER — Ambulatory Visit (HOSPITAL_COMMUNITY)
Admission: RE | Admit: 2011-05-05 | Discharge: 2011-05-05 | Disposition: A | Discharge status: Home / Self Care | Payer: Medicare Other | Source: Ambulatory Visit | Attending: Family Medicine | Admitting: Family Medicine

## 2011-05-05 ENCOUNTER — Encounter: Payer: Self-pay | Admitting: Family Medicine

## 2011-05-05 DIAGNOSIS — M79609 Pain in unspecified limb: Secondary | ICD-10-CM | POA: Insufficient documentation

## 2011-05-05 DIAGNOSIS — M79669 Pain in unspecified lower leg: Secondary | ICD-10-CM | POA: Insufficient documentation

## 2011-05-05 DIAGNOSIS — Z720 Tobacco use: Secondary | ICD-10-CM

## 2011-05-05 DIAGNOSIS — R627 Adult failure to thrive: Secondary | ICD-10-CM

## 2011-05-05 DIAGNOSIS — I10 Essential (primary) hypertension: Secondary | ICD-10-CM

## 2011-05-05 DIAGNOSIS — F17200 Nicotine dependence, unspecified, uncomplicated: Secondary | ICD-10-CM

## 2011-05-05 LAB — BASIC METABOLIC PANEL
BUN: 25 mg/dL — ABNORMAL HIGH (ref 6–23)
CO2: 27 mEq/L (ref 19–32)
Chloride: 102 mEq/L (ref 96–112)
Potassium: 4 mEq/L (ref 3.5–5.3)

## 2011-05-05 MED ORDER — NICOTINE 21 MG/24HR TD PT24: 1.0000 | MEDICATED_PATCH | TRANSDERMAL | Status: AC

## 2011-05-05 NOTE — Assessment & Plan Note (Signed)
Overall presentation consistent with MSK strain. Discussed analgesics, hydration, warm compressess. Given popliteal tenderness and + smoking history will also send pt for venous duplex to rule DVT. Wil also check electrolytes given HCTZ use. Discussed red flags for return. Pt agreeable to plan.   Addendum: Venous duplex preliminarily negative per u/s tech report. (page received prior to end of clinic)

## 2011-05-05 NOTE — Assessment & Plan Note (Signed)
Pt seemingly ready to quit. Nicotine patches given to pt.

## 2011-05-05 NOTE — Patient Instructions (Signed)
It was nice to meet you today I think your calf pain is likely from a strain of your calf muscle. However, given your history of smoking, we need to make sure this isnt a blood clot.  I am also checking some labs to make sure that your electrolytes are ok.  If you develop any worsening in your calf pain please give [place] a call Follow up with Dr. Hulen Luster in 1 month Call with any other questions,  God Bless,  Doree Albee MD  Muscle Strain / Pulled Muscle A pulled muscle, or muscle strain, occurs when a muscle is over-stretched. A small number of muscle fibers may also be torn. This is especially common in athletes. This happens when a sudden violent force placed on a muscle pushes it past its capacity. Usually, recovery from a pulled muscle takes 1 to 2 weeks. But complete healing will take 5 to 6 weeks. There are millions of muscle fibers. Following injury, your body will usually return to normal quickly. HOME CARE INSTRUCTIONS  While awake, apply ice to the sore muscle for 15 minutes each hour for the first 2 days. Put ice in a plastic bag and place a towel between the bag of ice and your skin.   Do not use the pulled muscle for several days. Do not use the muscle if you have pain.   You may wrap the injured area with an elastic bandage for comfort. Be careful not to bind it too tightly. This may interfere with blood circulation.   Only take over-the-counter or prescription medicines for pain, discomfort, or fever as directed by your caregiver. Do not use aspirin as this will increase bleeding (bruising) at injury site.   Warming up before exercise helps prevent muscle strains.  SEEK MEDICAL CARE IF: There is increased pain or swelling in the affected area. MAKE SURE YOU:   Understand these instructions.   Will watch your condition.   Will get help right away if you are not doing well or get worse.  Document Released: 09/26/2005 Document Re-Released: 12/21/2009 [facility] Patient  Information 2011 [place], [place].

## 2011-05-05 NOTE — Progress Notes (Signed)
  Subjective:    Patient ID: [name], female    DOB: 1928/11/12, 77 y.o.   MRN: [id number]  HPI Pt reports leg pain x 2 weeks per pt. Pt states that she was doing vigorous activity in her garden and woke up next am with significant R calf pain. Post calf pain worse with R foot dorsiflexion. Denies red ness or swelling. Has been staying relatively hydrated per pt. Has been compliant with HCTZ per pt. Pain has progressively worsened over last week per pt. Pt denies any hx/o blood clots. Pt has been active, no recent surgery.  RFs:  + smoking history of >1PPD + Mild R popliteal tenderness   Review of Systems See HPi     Objective:   Physical Exam Gen: up in bed, NAD CV: RRR, no murmurs PULM: CTAB, no wheezes, rales, rhoncii EXT: calves symmetric, mild R popliteal tenderness, mild R TTP along gastronemius Neuro: no focal neurological deficits.  Assessment & Plan:

## 2011-05-24 ENCOUNTER — Other Ambulatory Visit: Payer: Self-pay | Admitting: *Deleted

## 2011-05-24 ENCOUNTER — Other Ambulatory Visit: Payer: Self-pay | Admitting: Cardiovascular Disease

## 2011-05-24 MED ORDER — AMLODIPINE BESYLATE 5 MG PO TABS: 5.0000 mg | ORAL_TABLET | ORAL | Status: DC

## 2011-05-24 NOTE — Telephone Encounter (Signed)
OPEN IN ERROR 

## 2011-06-01 ENCOUNTER — Encounter (INDEPENDENT_AMBULATORY_CARE_PROVIDER_SITE_OTHER): Payer: Self-pay | Admitting: General Surgery

## 2011-12-20 ENCOUNTER — Encounter: Payer: Self-pay | Admitting: Family Medicine

## 2011-12-20 ENCOUNTER — Ambulatory Visit (INDEPENDENT_AMBULATORY_CARE_PROVIDER_SITE_OTHER): Payer: Medicare Other | Admitting: Family Medicine

## 2011-12-20 VITALS — BP 172/80 | HR 68 | Temp 97.8°F | Ht 66.0 in | Wt 166.0 lb

## 2011-12-20 DIAGNOSIS — S43429A Sprain of unspecified rotator cuff capsule, initial encounter: Secondary | ICD-10-CM

## 2011-12-20 DIAGNOSIS — S46019A Strain of muscle(s) and tendon(s) of the rotator cuff of unspecified shoulder, initial encounter: Secondary | ICD-10-CM | POA: Insufficient documentation

## 2011-12-20 DIAGNOSIS — M25511 Pain in right shoulder: Secondary | ICD-10-CM

## 2011-12-20 DIAGNOSIS — M25519 Pain in unspecified shoulder: Secondary | ICD-10-CM

## 2011-12-20 MED ORDER — NAPROXEN 500 MG PO TABS: 500.0000 mg | ORAL_TABLET | Freq: Two times a day (BID) | ORAL | Status: DC

## 2011-12-20 MED ORDER — CYCLOBENZAPRINE HCL 5 MG PO TABS: 5.0000 mg | ORAL_TABLET | Freq: Every evening | ORAL | Status: AC | PRN

## 2011-12-20 NOTE — Progress Notes (Signed)
  Subjective:    Patient ID: Cheryl Anderson, female    DOB: 02/01/1929, 78 y.o.   MRN: [id number]  HPI  Cheryl Anderson presents for right shoulder pain that has been bothering her for 3 months.  She says it started hurting around the [place], but cannot think of any injury to it.  She says it hurts all the time, but it is extremely painful to lift her arm above her head (ie to do her hair or put dishes away).  She has not taken any medications for this, but has used a heating pad on it from time to time.  She has no numbness or tingling in the hand.  She denies back pain. She has not had shoulder problems before.    Review of Systems Pertinent items in HPI.     Objective:   Physical Exam Shoulder: Inspection reveals no abnormalities, atrophy or asymmetry bilaterally. Palpation is normal with no tenderness over AC joint or bicipital groove bilaterallly. She does have muscle tightness in supraspinatus. ROM is full in all planes, but painful with overhead motion in right shoulder.  Right Rotator cuff strength testing reveals pain with testing throughout- difficult to assess strength due to pain. Left rotator cuff strength normal.  + hawkin's test and empty can on right, negative on left.  No labral pathology noted with negative Obrien's, negative clunk and good stability. Normal scapular function observed. No painful arc and no drop arm sign. No apprehension sign       Assessment & Plan:

## 2011-12-20 NOTE — Assessment & Plan Note (Signed)
Likely due to rotator cuff strain. Do not see definite signs of weakness or tears.

## 2011-12-20 NOTE — Patient Instructions (Signed)
You have strained your rotator cuff.  Please see the attached hand out with exercises.  Remember that you need to move your shoulder for it to get better.

## 2011-12-20 NOTE — Assessment & Plan Note (Addendum)
Will schedule naproxen for a week, then use PRN.  Gave flexeril for muscle relaxation.  Showed pt how to do HEP shoulder exercises, and gave hand out, and gave theraband.  She is to follow up in one month if not improved.

## 2012-01-20 ENCOUNTER — Encounter: Payer: Self-pay | Admitting: Family Medicine

## 2012-01-20 ENCOUNTER — Ambulatory Visit (INDEPENDENT_AMBULATORY_CARE_PROVIDER_SITE_OTHER): Payer: Medicare Other | Admitting: Family Medicine

## 2012-01-20 VITALS — BP 148/68 | HR 85 | Ht 66.0 in | Wt 166.0 lb

## 2012-01-20 DIAGNOSIS — R609 Edema, unspecified: Secondary | ICD-10-CM

## 2012-01-20 DIAGNOSIS — M25511 Pain in right shoulder: Secondary | ICD-10-CM

## 2012-01-20 DIAGNOSIS — J309 Allergic rhinitis, unspecified: Secondary | ICD-10-CM

## 2012-01-20 DIAGNOSIS — E785 Hyperlipidemia, unspecified: Secondary | ICD-10-CM

## 2012-01-20 DIAGNOSIS — I10 Essential (primary) hypertension: Secondary | ICD-10-CM

## 2012-01-20 DIAGNOSIS — M25519 Pain in unspecified shoulder: Secondary | ICD-10-CM

## 2012-01-20 MED ORDER — ALENDRONATE SODIUM 70 MG PO TABS: 70.0000 mg | ORAL_TABLET | ORAL | Status: DC

## 2012-01-20 MED ORDER — ESOMEPRAZOLE MAGNESIUM 40 MG PO CPDR: 40.0000 mg | DELAYED_RELEASE_CAPSULE | Freq: Every day | ORAL | Status: DC

## 2012-01-20 MED ORDER — ATORVASTATIN CALCIUM 80 MG PO TABS: 80.0000 mg | ORAL_TABLET | Freq: Every day | ORAL | Status: DC

## 2012-01-20 MED ORDER — ISOSORBIDE MONONITRATE ER 30 MG PO TB24: 30.0000 mg | ORAL_TABLET | Freq: Every day | ORAL | Status: DC

## 2012-01-20 MED ORDER — HYDROCHLOROTHIAZIDE 25 MG PO TABS: 25.0000 mg | ORAL_TABLET | Freq: Every day | ORAL | Status: DC

## 2012-01-20 MED ORDER — MOMETASONE FUROATE 50 MCG/ACT NA SUSP: 1.0000 | Freq: Every day | NASAL | Status: DC

## 2012-01-20 MED ORDER — LORATADINE 10 MG PO TABS: 10.0000 mg | ORAL_TABLET | Freq: Every day | ORAL | Status: DC

## 2012-01-20 MED ORDER — RAMIPRIL 10 MG PO TABS: 10.0000 mg | ORAL_TABLET | Freq: Every day | ORAL | Status: DC

## 2012-01-20 NOTE — Patient Instructions (Signed)
For your shoulder-for tonight, please take it easy. If It hurts more tomorrow you can use ice. Please give me a call if it is no better in 2 weeks. This should last a few months it is going to work.  For your medicines-I have refilled your medicines and you have 11 refills on everything in the pharmacy.  Please stop the amlodipine I had increased your Altace, you may take 2 of them until you get the new prescription.  You can use compression stockings from the pharmacy for your leg swelling Try to avoid medicines like Advil, Motrin, Aleve, naproxen as these can cause swelling  Please make an appointment for the lab for one week from now.

## 2012-01-22 NOTE — Progress Notes (Signed)
  Subjective:    Patient ID: Cheryl Anderson, female    DOB: [birth year], 78 y.o.   MRN: [id number]  HPI shoulder pain-pt present for f/u of right shoulder pain. this has been bothering her for 3 months. She has no numbness or tingling in the hand.  She denies back pain. She has not had shoulder problems before.  Hurts to raise above head.  Tried exercises and aleve with no benefit.  Would like injection today.  She had one several years ago that worked well.  LE edema- pt has noticed several months of LE edema.  This was not worse with NSAIDS.  She has not tried anything for it. Not painful.  This is bilateral and symmetric.  No cough or CP.  Current 1/2 ppd smoker Review of Systems Denies CP, SOB, HA, N/V/D, fever     Objective:   Physical Exam  Vital signs reviewed General appearance - alert, well appearing, and in no distress Heart - normal rate, regular rhythm, normal S1, S2, no murmurs, rubs, clicks or gallops Chest - clear to auscultation, no wheezes, rales or rhonchi, symmetric air entry, no tachypnea, retractions or cyanosis MSK-right shoulder wihtout swelling or heat. No tenderness with palpation including AC joint.  Full passive ROM.  Active ROM limited by pain.  Pt was consented for procedure including risks of bleeding and infection.  The area was cleaned with alcohol.  2 cc of lidocaine and 40mg   of decadron   were injected into the joint.  A bandaid was applied.      Assessment & Plan:

## 2012-01-22 NOTE — Assessment & Plan Note (Signed)
D/c'd amlodipine due to LE edema.  Increase ACE-I to twice daily dose.  Check Cr in one week.   BP Readings from Last 3 Encounters:  01/20/12 148/68  12/20/11 172/80  05/05/11 168/75

## 2012-01-22 NOTE — Assessment & Plan Note (Signed)
Mild bilateral pitting edema to ankles.  Likely 2/2 venous insufficiency.  Advised compression hose.  Advised avoidance of NSAIDs.  Stopped amlodipine today.

## 2012-01-22 NOTE — Assessment & Plan Note (Signed)
Pt with right shoulder pain tried NSAIDS and exercises.  Desires injection so gave steroid injection today.   To return in 2 weeks if no better.  I think she would benefit from PT but pt does not want to go.

## 2012-01-27 ENCOUNTER — Other Ambulatory Visit: Payer: Medicare Other

## 2012-01-27 DIAGNOSIS — E785 Hyperlipidemia, unspecified: Secondary | ICD-10-CM

## 2012-01-27 NOTE — Progress Notes (Signed)
cmp and flp done today Sion Thane 

## 2012-01-28 LAB — COMPREHENSIVE METABOLIC PANEL
AST: 15 U/L (ref 0–37)
BUN: 18 mg/dL (ref 6–23)
Calcium: 8.9 mg/dL (ref 8.4–10.5)
Chloride: 105 mEq/L (ref 96–112)
Creat: 0.99 mg/dL (ref 0.50–1.10)
Total Bilirubin: 0.6 mg/dL (ref 0.3–1.2)

## 2012-01-28 LAB — LIPID PANEL
Cholesterol: 221 mg/dL — ABNORMAL HIGH (ref 0–200)
HDL: 55 mg/dL (ref >39)
Total CHOL/HDL Ratio: 4 Ratio

## 2012-01-30 ENCOUNTER — Encounter: Payer: Self-pay | Admitting: Family Medicine

## 2012-02-21 ENCOUNTER — Encounter: Payer: Self-pay | Admitting: Family Medicine

## 2012-02-21 ENCOUNTER — Ambulatory Visit (INDEPENDENT_AMBULATORY_CARE_PROVIDER_SITE_OTHER): Payer: Medicare Other | Admitting: Family Medicine

## 2012-02-21 VITALS — BP 110/66 | HR 72 | Temp 97.6°F | Ht 66.0 in | Wt 163.0 lb

## 2012-02-21 DIAGNOSIS — S46019A Strain of muscle(s) and tendon(s) of the rotator cuff of unspecified shoulder, initial encounter: Secondary | ICD-10-CM

## 2012-02-21 DIAGNOSIS — R42 Dizziness and giddiness: Secondary | ICD-10-CM

## 2012-02-21 DIAGNOSIS — S43429A Sprain of unspecified rotator cuff capsule, initial encounter: Secondary | ICD-10-CM

## 2012-02-21 NOTE — Patient Instructions (Signed)
I think you should stop your HCTZ Please call your cardiologist and make a followup appointment if you think it's been about a year I would like to see you in one week and follow up your blood pressure and how you're feeling  Please stopped at the front desk and make an appointment for sports medicine for your shoulder

## 2012-02-21 NOTE — Assessment & Plan Note (Signed)
Dizziness and orthostatic hypotension. Will stop HCTZ. We'll see back in one week for recheck.

## 2012-02-21 NOTE — Progress Notes (Signed)
  Subjective:    Patient ID: Cheryl Anderson, female    DOB: [birth year], 78 y.o.   MRN: [id number]  HPI  Dizziness-patient will sometimes feel lightheaded. This seems to have been out of the blue but mostly when she stands up fast. She describes this as spots in her vision sometimes. She is not having any chest pain or palpitations at this time. She is not having any shortness of breath with this. She does not have headache or change in vision.  Shoulder pain-2 months ago patient had a week of naproxen as well as some exercises. One month ago she had a shoulder injection. Neither of these things seem to help at all. She continues to have pain and weakness on that side.  Nonsmoker Review of Systems See above    Objective:   Physical Exam Vital signs reviewed General appearance - alert, well appearing, and in no distress Heart - normal rate, regular rhythm, normal S1, S2, no murmurs, rubs, clicks or gallops Chest - clear to auscultation, no wheezes, rales or rhonchi, symmetric air entry, no tachypnea, retractions or cyanosis Neuro-PERRL, normal gait, EOMI, CN II-XII       Assessment & Plan:

## 2012-02-21 NOTE — Assessment & Plan Note (Signed)
Has tried exercises with therapy band, a week as scheduled naproxen, and has had an injection 1 month ago. She has not had any relief. Asked her to followup with sports medicine to

## 2012-02-28 ENCOUNTER — Ambulatory Visit: Payer: Medicare Other | Admitting: Family Medicine

## 2012-03-06 ENCOUNTER — Other Ambulatory Visit: Payer: Medicare Other | Admitting: Sports Medicine

## 2012-03-15 ENCOUNTER — Encounter: Payer: Self-pay | Admitting: Family Medicine

## 2012-03-15 ENCOUNTER — Ambulatory Visit (INDEPENDENT_AMBULATORY_CARE_PROVIDER_SITE_OTHER): Payer: Medicare Other | Admitting: Family Medicine

## 2012-03-15 VITALS — BP 147/64 | HR 72 | Temp 97.8°F | Ht 66.0 in | Wt 164.8 lb

## 2012-03-15 DIAGNOSIS — I10 Essential (primary) hypertension: Secondary | ICD-10-CM

## 2012-03-15 NOTE — Patient Instructions (Addendum)
I Am glad that you're feeling better Please stop at the front desk and make an appointment with Arlys John for your free Medicare wellness check  You can make better for when you get back from your trip  Please ask the pharmacist how much Flonase would cost I would be happy to call that in for you

## 2012-03-15 NOTE — Progress Notes (Signed)
  Subjective:    Patient ID: Cheryl Anderson, female    DOB: 1928-11-01, 78 y.o.   MRN: [id number]  HPI  Hypertension-patient has been taking her Ramaprill and Imdur but has stopped her HCTZ. She says that her dizziness is better. She has not checked her blood pressure at home. She denies chest pain, shortness of breath, dizziness.  Review of Systems Denies CP, SOB, HA, N/V/D, fever     Objective:   Physical Exam Vital signs reviewed General appearance - alert, well appearing, and in no distress Heart - normal rate, regular rhythm, normal S1, S2, no murmurs, rubs, clicks or gallops Chest - clear to auscultation, no wheezes, rales or rhonchi, symmetric air entry, no tachypnea, retractions or cyanosis        Assessment & Plan:

## 2012-03-15 NOTE — Assessment & Plan Note (Signed)
Blood pressure is doing well off of HCTZ. Would allow a slightly higher goal in this older woman as she did get dizzy with the lower blood pressures.

## 2012-05-07 ENCOUNTER — Encounter: Payer: Self-pay | Admitting: Family Medicine

## 2012-05-07 ENCOUNTER — Ambulatory Visit (INDEPENDENT_AMBULATORY_CARE_PROVIDER_SITE_OTHER): Payer: Medicare Other | Admitting: Family Medicine

## 2012-05-07 VITALS — BP 153/74 | HR 81 | Ht 66.0 in | Wt 158.0 lb

## 2012-05-07 DIAGNOSIS — G4762 Sleep related leg cramps: Secondary | ICD-10-CM

## 2012-05-07 DIAGNOSIS — L820 Inflamed seborrheic keratosis: Secondary | ICD-10-CM | POA: Insufficient documentation

## 2012-05-07 HISTORY — DX: Sleep related leg cramps: G47.62

## 2012-05-07 LAB — BASIC METABOLIC PANEL
BUN: 17 mg/dL (ref 6–23)
Calcium: 9.1 mg/dL (ref 8.4–10.5)
Glucose, Bld: 101 mg/dL — ABNORMAL HIGH (ref 70–99)
Sodium: 139 mEq/L (ref 135–145)

## 2012-05-07 MED ORDER — GABAPENTIN 300 MG PO CAPS: 300.0000 mg | ORAL_CAPSULE | Freq: Every day | ORAL | Status: DC

## 2012-05-07 NOTE — Patient Instructions (Signed)
I suspect you have restless legs syndrome.  I will check a lab test to be sure you do not have electrolyte abnormalities.  I have written a prescription for gabapentin, this is a medication that helps with restless legs.   I will make a referral to the Dermatologist, the office will contact you with an appointment.

## 2012-05-09 NOTE — Progress Notes (Signed)
  Subjective:    Patient ID: Cheryl Anderson, female    DOB: 11/04/1928, 78 y.o.   MRN: [id number]  HPI  Cheryl Anderson comes in with a few complaints:  Legs and feet: She says she has leg cramping and foot pain at night time that wakes her from sleep.  She says sometimes she has to get out of bed to make the cramping stop.  She has never had any problems with muscle cramps, dehydration, or electrolyte abnormalities that she knows of.  This has been going on for years, but has been worse the past few months and she says she is not getting enough sleep because of it.   Mole on face- she has a large mole on her right cheek.  She had it removed several years ago, but she has another one.  She also has a similar one on her back.  She says the get very dry and scaly, sometimes hurt, itch, or even bleed a little.  She would like to have them removed.   Review of Systems See HPI    Objective:   Physical Exam BP 153/74  Pulse 81  Ht 5\' 6"  (1.676 m)  Wt 158 lb (71.668 kg)  BMI 25.50 kg/m2 General appearance: alert, cooperative and no distress Extremities: extremities normal, atraumatic, no cyanosis or edema, no venous stasis changes or other abnormalities of LE noted.  Pulses: 2+ and symmetric Skin: Patient has large, quarter size rough lesion on left cheek, another dime sized one on her left upper back, appear to be seborrheic keratosis.  Neurologic: Grossly normal       Assessment & Plan:

## 2012-05-09 NOTE — Assessment & Plan Note (Addendum)
Will refer to dermatology for removal as the face lesion is rather large and minimal scarring will be important.

## 2012-05-09 NOTE — Assessment & Plan Note (Signed)
I suspect this is restless leg syndrome.  Patient's daughter also has this condition and takes gabapentin for it.  Will start gabapentin and have her follow up in 1-2 months to see if it has improved her symptoms.

## 2012-05-11 ENCOUNTER — Encounter: Payer: Self-pay | Admitting: Family Medicine

## 2012-06-20 ENCOUNTER — Ambulatory Visit: Payer: Medicare Other | Admitting: Family Medicine

## 2012-08-17 ENCOUNTER — Encounter: Payer: Self-pay | Admitting: Family Medicine

## 2012-08-17 ENCOUNTER — Ambulatory Visit (INDEPENDENT_AMBULATORY_CARE_PROVIDER_SITE_OTHER): Payer: Medicare Other | Admitting: Family Medicine

## 2012-08-17 ENCOUNTER — Ambulatory Visit (HOSPITAL_COMMUNITY)
Admission: RE | Admit: 2012-08-17 | Discharge: 2012-08-17 | Disposition: A | Discharge status: Home / Self Care | Payer: Medicare Other | Source: Ambulatory Visit | Attending: Family Medicine | Admitting: Family Medicine

## 2012-08-17 ENCOUNTER — Telehealth: Payer: Self-pay | Admitting: Family Medicine

## 2012-08-17 VITALS — BP 184/70 | HR 80 | Temp 98.1°F | Ht 66.0 in | Wt 161.0 lb

## 2012-08-17 DIAGNOSIS — M79609 Pain in unspecified limb: Secondary | ICD-10-CM | POA: Insufficient documentation

## 2012-08-17 DIAGNOSIS — R300 Dysuria: Secondary | ICD-10-CM

## 2012-08-17 DIAGNOSIS — M79671 Pain in right foot: Secondary | ICD-10-CM

## 2012-08-17 DIAGNOSIS — Z23 Encounter for immunization: Secondary | ICD-10-CM

## 2012-08-17 DIAGNOSIS — S92901A Unspecified fracture of right foot, initial encounter for closed fracture: Secondary | ICD-10-CM | POA: Insufficient documentation

## 2012-08-17 NOTE — Progress Notes (Signed)
  Subjective:    Patient ID: Cheryl Anderson, female    DOB: 04-30-1929, 78 y.o.   MRN: [id number]  HPI # Right foot pain and swelling She slipped off the last 2 steps on a patio on 10/31 and tripped and landed hard on the toes of her right foot Since then, she has had persistent pain of her foot and swelling. Her toes were initially more swollen but now the swelling seems to be on the dorsum of her foot. Regarding activity limitations, the patient says that she "deals with it" and "doesn't let it stop her", but I get the impression that she is not as active as she would like to be because of the pain. Alleviated by: nothing Modalities/medications tried: nothing Exacerbated by: walking and using that foot; not worse at a particularly time of day  # She would like flu shot today   Review of Systems Per HPI Denies rash Denies fevers/chills  Allergies, medication, past medical history reviewed.  HLD--LDL 155 01/2012 HTN--she did not take her medications this morning Tobacco use--1 ppd. She knows it is bad for her but is not ready to take steps to quit.  History of irregular heart beat--she had been followed by a cardiologist but has not seen him for a long time. She denies chest pain or palpitations or shortness-of-breath with exertion, orthopnea.     Objective:   Physical Exam GEN: NAD; well-nourished, -appearing; looks much younger than her age RIGHT FOOT: 1+ pitting edema dorsum; 2+ pedal pulses; 4/5 strength in toes and ankles (limited by pain); no erythema, warmth; lateral dorsal foot tenderness mild    Assessment & Plan:  She was advised to make a follow-up to check cholesterol and follow-up on chronic hypertension, hyperlipidemia, tobacco, and cardiac status within a month. She does not have worrisome symptoms today.

## 2012-08-17 NOTE — Patient Instructions (Addendum)
I will call you with your x-ray results today or tomorrow.   Flu shot today.  Follow-up within a month, come in fasting, so we can check your cholesterol and talk more about your heart.

## 2012-08-17 NOTE — Assessment & Plan Note (Addendum)
We will rule out fracture. She will go and get x-ray now.>>>significant for minimal step-off base third and fourth metatarsals. Lisfranc joint appears intact. Called patient. She will pick-up Rx for post-operative shoe on Monday afternoon and wear for 2 weeks. She will call and let me know if the pain and swelling is not alleviated at that time. Consider referral to Sports Medicine at that time.

## 2012-08-17 NOTE — Telephone Encounter (Signed)
Discussed x-ray results See office note for plan details  Rx left front desk

## 2012-08-30 ENCOUNTER — Encounter: Payer: Self-pay | Admitting: Family Medicine

## 2012-08-30 ENCOUNTER — Ambulatory Visit (HOSPITAL_COMMUNITY)
Admission: RE | Admit: 2012-08-30 | Discharge: 2012-08-30 | Disposition: A | Discharge status: Home / Self Care | Payer: Medicare Other | Source: Ambulatory Visit | Attending: Family Medicine | Admitting: Family Medicine

## 2012-08-30 ENCOUNTER — Ambulatory Visit (INDEPENDENT_AMBULATORY_CARE_PROVIDER_SITE_OTHER): Payer: Medicare Other | Admitting: Family Medicine

## 2012-08-30 VITALS — BP 151/72 | HR 69 | Temp 97.8°F | Ht 66.0 in | Wt 165.0 lb

## 2012-08-30 DIAGNOSIS — S92909A Unspecified fracture of unspecified foot, initial encounter for closed fracture: Secondary | ICD-10-CM

## 2012-08-30 DIAGNOSIS — S92901A Unspecified fracture of right foot, initial encounter for closed fracture: Secondary | ICD-10-CM

## 2012-08-30 DIAGNOSIS — M79609 Pain in unspecified limb: Secondary | ICD-10-CM | POA: Insufficient documentation

## 2012-08-30 MED ORDER — CALCIUM CARBONATE-VITAMIN D 600-400 MG-UNIT PO CHEW: 1.0000 | CHEWABLE_TABLET | Freq: Two times a day (BID) | ORAL | Status: DC

## 2012-08-30 NOTE — Assessment & Plan Note (Addendum)
Her pain and swelling may be slightly improved but still present.  We will re-check x-ray of her right foot to re-evaluate fracture. She was advised to continue to wear post-op shoe and take Tylenol 1-2 tablets scheduled to help with the pain.  We discussed how may take 4-6 weeks to heal, and she seems to be almost at her normal activities, although her pain does seem to bother her.  We will also start calcium and vitamin D in this elderly patient with known osteoporosis.  She will follow-up in 2 weeks if x-ray appears stable to re-evaluate and for potential referral for physical therapy for stretching and strengthening.

## 2012-08-30 NOTE — Progress Notes (Signed)
  Subjective:    Patient ID: Cheryl Anderson, female    DOB: [birth year], 78 y.o.   MRN: [id number]  HPI # Follow-up right foot 3rd and 4th metatarsal fracture She thinks the pain is improving, however, she is still having pain, and the swelling is persistent but is not getting worse.  She wears the post-op shoe during the day and seems to be functioning ambulatory-wise almost at her baseline.  She is not taking pain medications.  Review of Systems  Allergies, medication, past medical history reviewed.      Objective:   Physical Exam GEN: NAD; well-appearing, -nourished RIGHT FOOT: tenderness along 3rd and 4th metatarsal with 1+ pedal edema dorsum of foot and medial malleolus (none on left); no tenderness along Lisfranc joint; 4/5 strength toe and ankle flexion; sensation intact    Assessment & Plan:

## 2012-08-30 NOTE — Patient Instructions (Addendum)
Please go over to [facility] and get the x-rays of your foot I will call you in 1-2 days after you get it done to go over the results  Take Tylenol 650 mg once or twice a day for the next 1-2 weeks to help with the pain  Try to keep the foot elevated whenever you are sitting down  Follow-up in 2 weeks

## 2012-08-31 ENCOUNTER — Telehealth: Payer: Self-pay | Admitting: Family Medicine

## 2012-08-31 DIAGNOSIS — S92901A Unspecified fracture of right foot, initial encounter for closed fracture: Secondary | ICD-10-CM

## 2012-08-31 NOTE — Telephone Encounter (Signed)
We discussed x-ray that does not show worsening fracture. If fact, fracture could not be detected.  Tylenol 1 tablet daily is helping her pain.  She will follow-up in 2 weeks.

## 2012-08-31 NOTE — Assessment & Plan Note (Signed)
We discussed x-ray that does not show worsening fracture. If fact, fracture could not be detected.  Tylenol 1 tablet daily is helping her pain.  She will follow-up in 2 weeks.  

## 2012-09-12 ENCOUNTER — Ambulatory Visit (INDEPENDENT_AMBULATORY_CARE_PROVIDER_SITE_OTHER): Payer: Medicare Other | Admitting: Family Medicine

## 2012-09-12 ENCOUNTER — Encounter: Payer: Self-pay | Admitting: Family Medicine

## 2012-09-12 VITALS — BP 145/69 | HR 77 | Temp 98.0°F | Ht 66.0 in | Wt 163.0 lb

## 2012-09-12 DIAGNOSIS — F17200 Nicotine dependence, unspecified, uncomplicated: Secondary | ICD-10-CM

## 2012-09-12 DIAGNOSIS — E785 Hyperlipidemia, unspecified: Secondary | ICD-10-CM

## 2012-09-12 DIAGNOSIS — I10 Essential (primary) hypertension: Secondary | ICD-10-CM

## 2012-09-12 DIAGNOSIS — I200 Unstable angina: Secondary | ICD-10-CM

## 2012-09-12 DIAGNOSIS — S92901A Unspecified fracture of right foot, initial encounter for closed fracture: Secondary | ICD-10-CM

## 2012-09-12 DIAGNOSIS — R7611 Nonspecific reaction to tuberculin skin test without active tuberculosis: Secondary | ICD-10-CM

## 2012-09-12 DIAGNOSIS — S92909A Unspecified fracture of unspecified foot, initial encounter for closed fracture: Secondary | ICD-10-CM

## 2012-09-12 DIAGNOSIS — N289 Disorder of kidney and ureter, unspecified: Secondary | ICD-10-CM

## 2012-09-12 LAB — BASIC METABOLIC PANEL
Calcium: 9.3 mg/dL (ref 8.4–10.5)
Potassium: 4 mEq/L (ref 3.5–5.3)
Sodium: 140 mEq/L (ref 135–145)

## 2012-09-12 LAB — LIPID PANEL
HDL: 53 mg/dL (ref >39)
LDL Cholesterol: 139 mg/dL — ABNORMAL HIGH (ref 0–99)
Total CHOL/HDL Ratio: 3.9 Ratio
VLDL: 13 mg/dL (ref 0–40)

## 2012-09-12 NOTE — Assessment & Plan Note (Signed)
Asymptomatic but will check routine annual CXR.

## 2012-09-12 NOTE — Assessment & Plan Note (Signed)
Elevated today. She did not take medications due to fasting.

## 2012-09-12 NOTE — Progress Notes (Signed)
  Subjective:    Patient ID: Cheryl Anderson, female    DOB: 12-11-1928, 78 y.o.   MRN: [id number]  HPI # Right foot metatarsal fracture Pain is significantly improved. She is not requiring any medication. Pain does not limit her activities.   # Hyperlipidemia She would like cholesterol checked.   # Tobacco use--1 ppd.  She is not interested in quitting  # Positive PPD Denies cough, blood in sputum, dyspnea  Review of Systems Denies chest pain, palpitations Denies nausea/vomiting/abdominal pain      Objective:   Physical Exam GEN: NAD RIGHT FOOT: mild TTP 3rd and 4th metatarsals; no swelling, erythema, warmth; 2+ pedal pulses    Assessment & Plan:

## 2012-09-12 NOTE — Assessment & Plan Note (Signed)
She is not interested in quitting  

## 2012-09-12 NOTE — Assessment & Plan Note (Signed)
It continues to improve. She is almost back to her baseline activity.

## 2012-09-12 NOTE — Patient Instructions (Addendum)
Go over to [facility] during business hours for your chest x-ray  If your lab results are normal, I will send you a letter with the results. If abnormal, someone at the clinic will get in touch with you.   I would encourage you to think about quitting smoking. Call 1-800-QUIT-NOW for more information.   Follow-up in 6 months

## 2012-09-16 ENCOUNTER — Telehealth: Payer: Self-pay | Admitting: Family Medicine

## 2012-09-16 NOTE — Telephone Encounter (Signed)
Called and left message with family member. Cholesterol is improved from last time and she does not need to start medications (we had discussed this possibility during her visit).

## 2012-10-25 ENCOUNTER — Ambulatory Visit (INDEPENDENT_AMBULATORY_CARE_PROVIDER_SITE_OTHER): Payer: Medicare Other | Admitting: Family Medicine

## 2012-10-25 ENCOUNTER — Encounter: Payer: Self-pay | Admitting: Family Medicine

## 2012-10-25 ENCOUNTER — Ambulatory Visit (HOSPITAL_COMMUNITY)
Admission: RE | Admit: 2012-10-25 | Discharge: 2012-10-25 | Disposition: A | Discharge status: Home / Self Care | Payer: Medicare Other | Source: Ambulatory Visit

## 2012-10-25 ENCOUNTER — Ambulatory Visit (HOSPITAL_COMMUNITY)
Admission: RE | Admit: 2012-10-25 | Discharge: 2012-10-25 | Disposition: A | Discharge status: Home / Self Care | Payer: Medicare Other | Source: Ambulatory Visit | Attending: Family Medicine | Admitting: Family Medicine

## 2012-10-25 VITALS — BP 135/73 | HR 74 | Temp 98.2°F | Ht 66.0 in | Wt 158.0 lb

## 2012-10-25 DIAGNOSIS — R531 Weakness: Secondary | ICD-10-CM | POA: Insufficient documentation

## 2012-10-25 DIAGNOSIS — R079 Chest pain, unspecified: Secondary | ICD-10-CM | POA: Insufficient documentation

## 2012-10-25 DIAGNOSIS — I200 Unstable angina: Secondary | ICD-10-CM

## 2012-10-25 DIAGNOSIS — R5381 Other malaise: Secondary | ICD-10-CM

## 2012-10-25 LAB — COMPREHENSIVE METABOLIC PANEL
ALT: 12 U/L (ref 0–35)
AST: 15 U/L (ref 0–37)
Albumin: 4.1 g/dL (ref 3.5–5.2)
BUN: 30 mg/dL — ABNORMAL HIGH (ref 6–23)
CO2: 29 mEq/L (ref 19–32)
Calcium: 9.4 mg/dL (ref 8.4–10.5)
Chloride: 103 mEq/L (ref 96–112)
Potassium: 4 mEq/L (ref 3.5–5.3)

## 2012-10-25 LAB — CBC WITH DIFFERENTIAL/PLATELET
Basophils Absolute: 0.1 10*3/uL (ref 0.0–0.1)
Eosinophils Relative: 0 % (ref 0–5)
HCT: 39.9 % (ref 36.0–46.0)
Hemoglobin: 13.5 g/dL (ref 12.0–15.0)
Lymphocytes Relative: 36 % (ref 12–46)
Lymphs Abs: 2.4 10*3/uL (ref 0.7–4.0)
MCV: 88.5 fL (ref 78.0–100.0)
Monocytes Absolute: 0.5 10*3/uL (ref 0.1–1.0)
Neutro Abs: 3.8 10*3/uL (ref 1.7–7.7)
RBC: 4.51 MIL/uL (ref 3.87–5.11)
RDW: 13.9 % (ref 11.5–15.5)
WBC: 6.8 10*3/uL (ref 4.0–10.5)

## 2012-10-25 LAB — TSH: TSH: 1.532 u[IU]/mL (ref 0.350–4.500)

## 2012-10-25 MED ORDER — NITROGLYCERIN 0.4 MG SL SUBL: SUBLINGUAL_TABLET | SUBLINGUAL | Status: DC

## 2012-10-25 NOTE — Assessment & Plan Note (Addendum)
Patient also with chest pain over last 4 days that has resolved. EKG as above does not raise concerns. Pushed patient with walking today and unable to reproduce (has not had pain). History of unstable angina seen in problem list. It is possible that this is stable angina so have given NTG. Patient knows to go to ED if pain not relieved with 3 doses. She will need cardiology follow up organized at her follow up visit.

## 2012-10-25 NOTE — Patient Instructions (Signed)
I am sorry you have been feeling weak Cheryl Anderson.  I was concerned it could be related to your heart but I am glad that you were able to walk the halls, not have chest pain today, had an essentially normal EKG, and that we found another reason for you possibly feeling weak.  I want you to stop taking hydrochlorothiazide and follow up with [place] on Monday or Tuesday of next weak to see if you are feeling better. I have sent in nitroglycerin to see if this helps with your chest pain. Let us know if you have to take it when you come back. Remember reasons we discussed for going to ER or calling 911.   Thanks, Dr. Durene Cal

## 2012-10-25 NOTE — Assessment & Plan Note (Addendum)
Patient recently ill over last month from multiple URIs and recovering from illness. Some weight loss about 5 lbs in this time. Patient with signs orthostatic hypotension with history of this on HCTZ. Have removed HCTZ once again today. CLose follow up on Monday. Suspect this is the cause. Will also get CBC (anemia concern), TSH, CMET (electrolytes primarily).

## 2012-10-25 NOTE — Progress Notes (Addendum)
Subjective:   1. Generalized weakness-over last 4 days patient states she has barely been wanting to get out of bed. When she does get up, she feels very weak and sometimes dizzy. Other primary symptom is pain along her sternum that occurs if she is up and active around the house for a while. This is relieved when she rests again. She has not felt any chest pain today. See ROS below as chest pain has no associated symptoms. She does feel slightly stronger today and yesterday after forcing herself to get out of bed more. Other symptoms include occasional slight HA (none currently). Patient was wondering if weakness is related to recent illnesses including a cold with cough, congestion before christmas which got better then she got another cold shortly after new years which has improved.   ROS--See HPI  With additions Denies fever, chills, body aches, nausea, vomiting, orthopnea, PND, lower extremity swelling, Shortness of breath either exertional or at rest, diaphoresis with chest pain, night sweats.   Past Medical History-HTN, history of orthostatic hypotension with changed BP goal, HLD, tobacco abuse-1ppd, history of vasospasm on Imdur, history of COPD onto on medication, history of positive PPD.   Reviewed problem list.  Medications- reviewed and updated Chief complaint-noted  Objective: BP 135/73  Pulse 74  Temp 98.2 F (36.8 C) (Oral)  Ht 5\' 6"  (1.676 m)  Wt 158 lb (71.668 kg)  BMI 25.50 kg/m2 Gen: NAD, resting comfortably in chair CV: RRR no mrg, occasional erratic beat less than every 30 seconds. Chest wall: tenderness along sternum Lungs: CTAB, no wheezes, rales rhonchi Abd: soft/nontender/nondistended/normal bowel sounds  MSK: moves all extremities, no edema  Skin: warm and dry, no rash  Neuro: CN II-XII intact, sensation and reflexes normal throughout, 5/5 muscle strength in bilateral upper and lower extremities. Normal finger to nose. Normal rapid alternating movements.    Gait-walked very quickly 2 laps with patient around circle in office. She was steady on her feet and without chest pain.  Orthostatic vital signs-HR increased by 13 from laying to standing and BP decreased though not by 20/10.   EKG-NSR, some enlargement of P wave in lead II consistent with previous, inverted t wave in v1, v2 unchanged from previous. Normal intervals. Normal axis. No st-t wave changes.   Assessment/Plan: See problem oriented charted

## 2012-10-26 ENCOUNTER — Telehealth: Payer: Self-pay | Admitting: Family Medicine

## 2012-10-26 NOTE — Telephone Encounter (Signed)
Informed patient test results unremarkable. States she is feeling much better off of the medicine. Still plans for follow up as previously discussed.

## 2012-10-31 ENCOUNTER — Encounter: Payer: Self-pay | Admitting: Family Medicine

## 2012-10-31 ENCOUNTER — Ambulatory Visit (INDEPENDENT_AMBULATORY_CARE_PROVIDER_SITE_OTHER): Payer: Medicare Other | Admitting: Family Medicine

## 2012-10-31 VITALS — BP 195/66 | HR 83 | Temp 98.3°F | Ht 66.0 in | Wt 164.0 lb

## 2012-10-31 DIAGNOSIS — I10 Essential (primary) hypertension: Secondary | ICD-10-CM

## 2012-10-31 DIAGNOSIS — J309 Allergic rhinitis, unspecified: Secondary | ICD-10-CM

## 2012-10-31 DIAGNOSIS — R531 Weakness: Secondary | ICD-10-CM

## 2012-10-31 DIAGNOSIS — F17200 Nicotine dependence, unspecified, uncomplicated: Secondary | ICD-10-CM

## 2012-10-31 DIAGNOSIS — R5381 Other malaise: Secondary | ICD-10-CM

## 2012-10-31 MED ORDER — GABAPENTIN 300 MG PO CAPS: 300.0000 mg | ORAL_CAPSULE | Freq: Every day | ORAL | Status: DC

## 2012-10-31 MED ORDER — LORATADINE 10 MG PO TABS: 10.0000 mg | ORAL_TABLET | Freq: Every day | ORAL | Status: DC

## 2012-10-31 MED ORDER — RAMIPRIL 10 MG PO TABS: 10.0000 mg | ORAL_TABLET | Freq: Every day | ORAL | Status: DC

## 2012-10-31 NOTE — Progress Notes (Signed)
  Subjective:    Patient ID: Cheryl Anderson, female    DOB: 1928/12/13, 79 y.o.   MRN: [id number]  HPI # Follow-up of weakness and concern for orthostatic hypotension while she was going through upper respiratory illness She is feeling much better now. She denies fevers, cough, or other respiratory symptoms.  She denies chest pain or headache.  # Hypertension She seems to have labile blood pressures She is no longer taking HCTZ but reports she is taking isosorbide and rampiril  # Continues to smoke but now has cut back from 1ppd to 1/2 ppd during this bought of illness She is contemplating quitting and thinks it is a good idea that she has cut back  Review of Systems Per HPI  Allergies, medication, past medical history reviewed.  -h/o angina, she used to see cardiologist     Objective:   Physical Exam Gen: NAD; thin CV: RRR, normal S1/S2, no m/r/g PULM: NI WOB; CTAB; good aeration; occasional coarse breath sound    Assessment & Plan:

## 2012-10-31 NOTE — Assessment & Plan Note (Signed)
Resolved

## 2012-10-31 NOTE — Patient Instructions (Addendum)
Re-start HCTZ Do not take if you are sick, have diarrhea, or are not eating or drinking normally  Make an appointment with Dr. Raymondo Band for ambulatory blood pressure monitoring  Follow-up in 3 months

## 2012-10-31 NOTE — Assessment & Plan Note (Signed)
It is elevated today after stopping her HCTZ. We will re-add but she was advised not to take if she is not feeling well due to her history of becoming orthostatic.  Also recommending ambulatory blood pressure monitoring with Dr. Raymondo Band.

## 2012-10-31 NOTE — Assessment & Plan Note (Signed)
She has cut back to 1/2 ppd during recent URI. She is contemplating quitting. Her granddaughter who is here with her today is strongly encouraging her to quit but the patient is undecided.  We discussed calling [employer] [facility] if she is interested to see if they have any free or discounted nicotine replacement.  We discussed Chantix and Wellbutrin as options as well.  Patient will "think about it".

## 2012-11-12 ENCOUNTER — Ambulatory Visit: Payer: Medicare Other | Admitting: Pharmacist

## 2012-11-22 ENCOUNTER — Ambulatory Visit: Payer: Medicare Other | Admitting: Pharmacist

## 2013-05-16 ENCOUNTER — Other Ambulatory Visit: Payer: Self-pay | Admitting: Family Medicine

## 2013-06-13 ENCOUNTER — Other Ambulatory Visit: Payer: Self-pay | Admitting: *Deleted

## 2013-06-20 MED ORDER — ISOSORBIDE MONONITRATE ER 30 MG PO TB24: ORAL_TABLET | ORAL | Status: DC

## 2013-06-20 MED ORDER — RAMIPRIL 10 MG PO CAPS: ORAL_CAPSULE | ORAL | Status: DC

## 2013-06-27 ENCOUNTER — Encounter: Payer: Self-pay | Admitting: Family Medicine

## 2013-06-27 DIAGNOSIS — F17200 Nicotine dependence, unspecified, uncomplicated: Secondary | ICD-10-CM

## 2013-06-27 MED ORDER — VARENICLINE TARTRATE 0.5 MG X 11 & 1 MG X 42 PO MISC: ORAL | Status: DC

## 2013-06-27 NOTE — Progress Notes (Signed)
Cheryl Anderson is a 79 y.o. female who was seen today for a home visit.  Cheryl Anderson feels well in general but is complaining of intermittent LE cramping. Primarily in calf area w/ ambulation and resolves w/ rest but also located in medial anterior thighs that may come on w/ rest. Also reports some benefit from taking a couple tsp of mustard or vinegar. This has woken pt up at night. Denies any LE numbness, falls, CP, Palpitations, syncope, n/v, HA, LE swelling. Has not needed Nitro anytime in the recent past. Continues to take gabapentin for peripheral neuropathy w/ good results. Pt continues to smoke 1ppd. Has used chantix in past w/ some success. Pt admits to taking medications most days but does miss does frequently. She did not take her BP medications today.   The following portions of the patient's history were reviewed and updated as appropriate: allergies, current medications, past medical history, family and social history, and problem list.  Patient is a smoker.  Past Medical History  Diagnosis Date  . Hypertension   . Hyperlipidemia   . Heart disorder   . Digestive problems   . CAD (coronary artery disease)     ROS as above otherwise neg.    Medications reviewed. Current Outpatient Prescriptions  Medication Sig Dispense Refill  . alendronate (FOSAMAX) 70 MG tablet Take 1 tablet (70 mg total) by mouth every 7 (seven) days. Take in the morning with a full glass of water, on an empty stomach, and do not take anything else by mouth or lie down for the next 30 min.  4 tablet  11  . aspirin 81 MG tablet Take 81 mg by mouth daily.        Marland Kitchen atorvastatin (LIPITOR) 80 MG tablet Take 1 tablet (80 mg total) by mouth daily.  30 tablet  11  . Calcium Carbonate-Vitamin D 600-400 MG-UNIT per chew tablet Chew 1 tablet by mouth 2 (two) times daily.  120 tablet  12  . esomeprazole (NEXIUM) 40 MG capsule Take 1 capsule (40 mg total) by mouth daily before breakfast.  30 capsule  11  . gabapentin  (NEURONTIN) 300 MG capsule Take 1 capsule (300 mg total) by mouth at bedtime. For RLS  30 capsule  5  . hydrochlorothiazide (HYDRODIURIL) 25 MG tablet       . isosorbide mononitrate (IMDUR) 30 MG 24 hr tablet EVERY DAY  30 tablet  6  . loratadine (CLARITIN) 10 MG tablet Take 1 tablet (10 mg total) by mouth daily.  30 tablet  11  . nitroGLYCERIN (NITROSTAT) 0.4 MG SL tablet Take every 5 minutes for 3 doses. If pain not resolved, call 911 or go to ER.  30 tablet  2  . ramipril (ALTACE) 10 MG capsule TAKE 1 (10 MG TOTAL) BY MOUTH DAILY.  30 capsule  5   No current facility-administered medications for this visit.    Exam:  There were no vitals taken for this visit. Gen: Well NAD, pleasant HEENT: EOMI,  MMM Lungs: CTABL Nl WOB Heart: irregularly irregular, no m/r/g, Abd: NABS, NT, ND L: 1+ DP, absent PT R 2+ DP, absent PT. Exts: 1+ ankle edema bilateral LE. Warm and well perfused. No LE hair on legs. No ulcerations.  Neuro: ambulation w/o difficulty, CN 2-12 grossly intact.   No results found for this or any previous visit (from the past 72 hour(s)).  A/P 79 yo AAF w/ multiple medical conditions w/ likely worsening PVD.  # Leg pain:  Likely claudication from PVD.  h/o HTN, CAD, smoker, HLD all concerning for distal disease. No h/o heart failure, or ABI studies - Likely to benefit from Pletal. Will discuss at next appt - ABIs when able - repeat HLD, encouraged importance of medication compliance,  -Smoking cessation most important.   # CV: irregularly irregular HR likely from PVC vs Afib. Asymptomatic.  - EKG at next appt - stressed importance of BP compliance - likely to refer to cards for evaluation for possible non-treadmill stress test and possible Echo  # Tobacco abuse: Long h/o of abuse. Pt w/ some success in past on Chantix. Pt willing to try quitting again and would like to try Chantix - Chantix sent to pharmacy.   # Screening/labs: Will obtain several labs and studies at  next appt - fasting lipids, CMET, CBC, Vit D, TSH, - EKG - schedule for ABI in house w/ Cheryl Anderson.   Cheryl Flatten, MD Family Medicine PGY-3 06/27/2013, 12:50 PM

## 2013-06-28 NOTE — Progress Notes (Signed)
Patient ID: Cheryl Anderson, female   DOB: Feb 24, 1929, 79 y.o.   MRN: [id number] Attending Addendum  I examined the patient and discussed the assessment and plan with Dr. Konrad Dolores. I have reviewed the note, made necessary revisions and agree.  A total of 45 minutes was spent on history, med review, exam and counseling the patient.     Dessa Phi, MD FAMILY MEDICINE TEACHING SERVICE

## 2013-07-25 ENCOUNTER — Encounter: Payer: Self-pay | Admitting: Family Medicine

## 2013-07-25 ENCOUNTER — Ambulatory Visit (INDEPENDENT_AMBULATORY_CARE_PROVIDER_SITE_OTHER): Payer: Medicare Other | Admitting: Family Medicine

## 2013-07-25 VITALS — BP 136/63 | HR 70 | Temp 97.6°F | Wt 167.0 lb

## 2013-07-25 DIAGNOSIS — F17200 Nicotine dependence, unspecified, uncomplicated: Secondary | ICD-10-CM

## 2013-07-25 DIAGNOSIS — R6889 Other general symptoms and signs: Secondary | ICD-10-CM | POA: Insufficient documentation

## 2013-07-25 DIAGNOSIS — M79609 Pain in unspecified limb: Secondary | ICD-10-CM

## 2013-07-25 DIAGNOSIS — M79605 Pain in left leg: Secondary | ICD-10-CM | POA: Insufficient documentation

## 2013-07-25 DIAGNOSIS — I10 Essential (primary) hypertension: Secondary | ICD-10-CM

## 2013-07-25 DIAGNOSIS — M79604 Pain in right leg: Secondary | ICD-10-CM

## 2013-07-25 DIAGNOSIS — J449 Chronic obstructive pulmonary disease, unspecified: Secondary | ICD-10-CM

## 2013-07-25 DIAGNOSIS — E785 Hyperlipidemia, unspecified: Secondary | ICD-10-CM

## 2013-07-25 DIAGNOSIS — M810 Age-related osteoporosis without current pathological fracture: Secondary | ICD-10-CM | POA: Insufficient documentation

## 2013-07-25 LAB — COMPREHENSIVE METABOLIC PANEL
ALT: 11 U/L (ref 0–35)
CO2: 31 mEq/L (ref 19–32)
Calcium: 9.5 mg/dL (ref 8.4–10.5)
Chloride: 102 mEq/L (ref 96–112)
Glucose, Bld: 86 mg/dL (ref 70–99)
Sodium: 138 mEq/L (ref 135–145)
Total Protein: 6.9 g/dL (ref 6.0–8.3)

## 2013-07-25 LAB — TSH: TSH: 2.332 u[IU]/mL (ref 0.350–4.500)

## 2013-07-25 LAB — CBC
MCHC: 34.2 g/dL (ref 30.0–36.0)
Platelets: 314 10*3/uL (ref 150–400)
RDW: 14.2 % (ref 11.5–15.5)

## 2013-07-25 LAB — LIPID PANEL
LDL Cholesterol: 198 mg/dL — ABNORMAL HIGH (ref 0–99)
Triglycerides: 93 mg/dL (ref <150)

## 2013-07-25 NOTE — Progress Notes (Signed)
Cheryl Anderson is a 79 y.o. female who presents to [facility] today for feels well overall  Occasional cough: seasonal allergies. Take s OTC allergy meds w/ relief. Denies fevers, CP, SOB.   Tobacco: not taking CHantix. Could not afford the 3 mo Rx. 1/2-1ppd.   Leg pain: present on ambulation. Baseline swelling. Long h/o smoking. Bilat. Achy in nature. On lipitor 80 and ASA81  Osteo: taking Foxamax w/o worsening Reflux. On nexium  HTN: on Imdur, HCTZ, and ramipril daily  The following portions of the patient's history were reviewed and updated as appropriate: allergies, current medications, past medical history, family and social history, and problem list.  Patient is a smoker  Past Medical History  Diagnosis Date  . Hypertension   . Hyperlipidemia   . Heart disorder   . Digestive problems   . CAD (coronary artery disease)     ROS as above otherwise neg.    Medications reviewed. Current Outpatient Prescriptions  Medication Sig Dispense Refill  . alendronate (FOSAMAX) 70 MG tablet Take 1 tablet (70 mg total) by mouth every 7 (seven) days. Take in the morning with a full glass of water, on an empty stomach, and do not take anything else by mouth or lie down for the next 30 min.  4 tablet  11  . aspirin 81 MG tablet Take 81 mg by mouth daily.        Marland Kitchen atorvastatin (LIPITOR) 80 MG tablet Take 1 tablet (80 mg total) by mouth daily.  30 tablet  11  . Calcium Carbonate-Vitamin D 600-400 MG-UNIT per chew tablet Chew 1 tablet by mouth 2 (two) times daily.  120 tablet  12  . esomeprazole (NEXIUM) 40 MG capsule Take 1 capsule (40 mg total) by mouth daily before breakfast.  30 capsule  11  . gabapentin (NEURONTIN) 300 MG capsule Take 1 capsule (300 mg total) by mouth at bedtime. For RLS  30 capsule  5  . hydrochlorothiazide (HYDRODIURIL) 25 MG tablet       . isosorbide mononitrate (IMDUR) 30 MG 24 hr tablet EVERY DAY  30 tablet  6  . loratadine (CLARITIN) 10 MG tablet Take 1 tablet (10 mg total)  by mouth daily.  30 tablet  11  . nitroGLYCERIN (NITROSTAT) 0.4 MG SL tablet Take every 5 minutes for 3 doses. If pain not resolved, call 911 or go to ER.  30 tablet  2  . ramipril (ALTACE) 10 MG capsule TAKE 1 (10 MG TOTAL) BY MOUTH DAILY.  30 capsule  5  . varenicline (CHANTIX STARTING MONTH PAK) 0.5 MG X 11 & 1 MG X 42 tablet Take one 0.5 mg tablet by mouth once daily for 3 days, then increase to one 0.5 mg tablet twice daily for 4 days, then increase to one 1 mg tablet twice daily.  53 tablet  0   No current facility-administered medications for this visit.    Exam: BP 136/63  Pulse 70  Temp(Src) 97.6 F (36.4 C) (Oral)  Wt 167 lb (75.751 kg)  BMI 26.97 kg/m2 Gen: Well NAD HEENT: EOMI,  MMM Lungs: CTABL Nl WOB Heart: RRR no MRG Abd: NABS, NT, ND Exts: trace Le edema, 2+ pulses   No results found for this or any previous visit (from the past 72 hour(s)).

## 2013-07-25 NOTE — Assessment & Plan Note (Signed)
Cont lipitor 80

## 2013-07-25 NOTE — Patient Instructions (Signed)
Thank you for coming in today You are doing well overall Please schedule an ABI test with Dr. Raymondo Band at your earliest convenience Please come back to see me in 3 months or sooner if needed I'll let you know if any of your blood work comes back abnormal Remember to ask for the chantix to be broken down into 1 month dosing

## 2013-07-25 NOTE — Assessment & Plan Note (Signed)
On fosamax and Ca and Vit D

## 2013-07-25 NOTE — Assessment & Plan Note (Signed)
Most concerning for PVD given hx of claudication type symptoms and h/o smoking, age, HLD. Referral to Dr. Raymondo Band for ABI

## 2013-07-25 NOTE — Assessment & Plan Note (Signed)
Counseled on stop smoking.  Chantix ordered

## 2013-07-25 NOTE — Assessment & Plan Note (Signed)
Cont current regimen

## 2013-07-25 NOTE — Assessment & Plan Note (Signed)
Check TSH and CBC today  

## 2013-07-25 NOTE — Assessment & Plan Note (Signed)
Counseled on stop smoking  Start chantix

## 2013-07-26 LAB — VITAMIN D 25 HYDROXY (VIT D DEFICIENCY, FRACTURES): Vit D, 25-Hydroxy: 19 ng/mL — ABNORMAL LOW (ref 30–89)

## 2013-08-06 ENCOUNTER — Ambulatory Visit (INDEPENDENT_AMBULATORY_CARE_PROVIDER_SITE_OTHER): Payer: Medicare Other | Admitting: Pharmacist

## 2013-08-06 ENCOUNTER — Encounter: Payer: Self-pay | Admitting: Pharmacist

## 2013-08-06 VITALS — BP 139/80 | HR 77 | Ht 66.0 in | Wt 166.7 lb

## 2013-08-06 DIAGNOSIS — M79609 Pain in unspecified limb: Secondary | ICD-10-CM

## 2013-08-06 DIAGNOSIS — M79604 Pain in right leg: Secondary | ICD-10-CM

## 2013-08-06 DIAGNOSIS — F17200 Nicotine dependence, unspecified, uncomplicated: Secondary | ICD-10-CM

## 2013-08-06 NOTE — Assessment & Plan Note (Signed)
She started Chantix a few days ago. We provided counseling on Chantix--counseled her to take Chantix with food if it causes upset stomach. Patient self-reported goal of smoking no more than 5 cigarettes a day by the middle of November. She is confident that she can achieve this goal. Written information provided.  F/U Rx Clinic Visit with Dr. Konrad Dolores or Dr. Raymondo Band in December.   Total time in face-to-face counseling 35 minutes.  Patient seen with Morrell Riddle, PharmD Candidate and Piedad Climes, PharmD Resident.

## 2013-08-06 NOTE — Assessment & Plan Note (Signed)
Mild PAD based on ABI of 0.82 in a patient with symptoms of some pain at night or after walking a lot. Results reviewed and written information provided.   F/U Clinic Visit with Dr. Konrad Dolores or Dr. Raymondo Band in December. Total time in face-to-face counseling 35 minutes.  Patient seen with Morrell Riddle, PharmD Candidate and Piedad Climes, PharmD Resident.

## 2013-08-06 NOTE — Progress Notes (Signed)
S:    Patient arrives in good spirits with her daughter and granddaughter. She presents to the clinic for PADABI evaluation.   Reports some pain with walking.  Pain is described as nagging, aching and cramping which occurs after several minutes of exercise. She reports walking a lot. Reports pain starting while at rest at night. Reports pain worsens "a little bit" when walking up hill or in a hurry. Denies pain when walking at an ordinary pace on a level surface   Reports pain resolves on sitting after a few minutes. Reports gabapentin helps the pain.  Pain is localized to both lower legs.  O:  Lower extremity Physical Exam includes  diminished pulses, absence of limb hair, thickened brittle nails.  bilateral  ABI overall = 0.82. Right Arm 148 mmHg    Left Arm 144 mmHg Right ankle posterior tibial 140 mmHg     dorsalis pedis 124 mmHg Left ankle posterior tibial 122 mmHg    dorsalis pedis 110 mmHg   A/P: Mild PAD based on ABI of 0.82 in a patient with symptoms of some pain at night or after walking a lot. Results reviewed and written information provided.   F/U Clinic Visit with Dr. Konrad Dolores or Dr. Raymondo Band in December.  Total time in face-to-face counseling 35 minutes.  Patient seen with Morrell Riddle, PharmD Candidate and Piedad Climes, PharmD Resident.   S: Age when started using tobacco on a daily basis 19. Number of Cigarettes per day 10 cigarettes on a good day, 15 when stressed. Brand smoked [employer]. Nicotine content 0.5-0.8.   Denies waking to smoke / Smokes times per night.    Most recent quit attempt about 20 years ago. Longest time ever been tobacco free 6 months. Currently taking Chantix to aid in cessation.  Rates CONFIDENCE of quitting tobacco on 1-10 scale of 10. Triggers to use tobacco include drinking coffee. Denies smoking after eating meals.     A/P:  She started Chantix a few days ago. We provided counseling on Chantix--counseled her to take Chantix  with food if it causes upset stomach. Patient self-reported goal of smoking no more than 5 cigarettes a day by the middle of November. She is confident that she can achieve this goal. Written information provided.  F/U Rx Clinic Visit with Dr. Konrad Dolores or Dr. Raymondo Band in December.   Total time in face-to-face counseling 35 minutes.  Patient seen with Morrell Riddle, PharmD Candidate and Piedad Climes, PharmD Resident.

## 2013-08-06 NOTE — Patient Instructions (Signed)
Thank you for coming in today!  Great job on trying to quit smoking. Keep taking the Chantix like you are supposed to. It might make your stomach feel upset but taking it with a meal can help this. Call us if your stomach is really upset.   Come back and see Dr. Konrad Dolores or Dr. Raymondo Band in December.

## 2013-08-07 NOTE — Progress Notes (Signed)
Patient ID: Cheryl Anderson, female   DOB: 04-21-1929, 79 y.o.   MRN: [id number] Reviewed: Agree with Dr. Macky Lower documentation and management.

## 2013-08-20 ENCOUNTER — Encounter: Payer: Self-pay | Admitting: Family Medicine

## 2013-08-20 ENCOUNTER — Telehealth: Payer: Self-pay | Admitting: *Deleted

## 2013-08-20 DIAGNOSIS — E559 Vitamin D deficiency, unspecified: Secondary | ICD-10-CM | POA: Insufficient documentation

## 2013-08-20 MED ORDER — ERGOCALCIFEROL 1.25 MG (50000 UT) PO CAPS: 50000.0000 [IU] | ORAL_CAPSULE | ORAL | Status: AC

## 2013-08-20 NOTE — Progress Notes (Signed)
Needs Vit D replacement based on last labs despite daily Vit D/Ca  Vit D 50,000 units Q wkly x 8 wks

## 2013-08-20 NOTE — Telephone Encounter (Signed)
Message copied by Henri Medal on Tue Aug 20, 2013 11:50 AM ------      Message from: [facility], DAVID J      Created: Tue Aug 20, 2013 11:45 AM       Please inform of low Vit D, despite current therapy. Recommend replacement therapy of 16109 units once weekly for 8 wks. Rx sent to pharmacy ------

## 2013-08-20 NOTE — Telephone Encounter (Signed)
Message left with daughter Clydie Braun and she is aware of results and rx at pharmacy.  Will let pt and other daughter Elita Quick know so they can pick this medication up. Jjesus Dingley,CMA

## 2013-08-23 ENCOUNTER — Telehealth: Payer: Self-pay | Admitting: Family Medicine

## 2013-08-23 NOTE — Telephone Encounter (Signed)
Pt called because [facility] doesn't have a refill request for Vitamin D, she would like [place] to resend this . jw

## 2013-08-25 ENCOUNTER — Other Ambulatory Visit: Payer: Self-pay | Admitting: Family Medicine

## 2013-08-26 MED ORDER — CALCIUM CARBONATE-VITAMIN D 600-400 MG-UNIT PO CHEW: 1.0000 | CHEWABLE_TABLET | Freq: Two times a day (BID) | ORAL | Status: DC

## 2013-08-26 NOTE — Telephone Encounter (Signed)
Refill on Vit D Ca sent in  Shelly Flatten, MD Family Medicine PGY-3 08/26/2013, 8:54 AM

## 2013-09-03 NOTE — Telephone Encounter (Signed)
Verified with pharmacy, rx never received. Called in verbally. Cheryl Anderson, [name]

## 2013-09-03 NOTE — Telephone Encounter (Signed)
ergocalciferol (VITAMIN D2) 50000 UNITS capsule

## 2013-09-03 NOTE — Telephone Encounter (Signed)
Patient calls today. A vit D regimen (8 days) was suppose to be sent in to the pharmacy because patient levels were so low. The vit D Ca that was called in was the incorrect. Correct regimen needs to be called in.

## 2013-09-03 NOTE — Telephone Encounter (Signed)
Pts dgt informed. Fleeger, Jessica Dawn  

## 2013-11-14 ENCOUNTER — Ambulatory Visit (INDEPENDENT_AMBULATORY_CARE_PROVIDER_SITE_OTHER): Payer: Medicare Other | Admitting: Family Medicine

## 2013-11-14 ENCOUNTER — Ambulatory Visit (HOSPITAL_COMMUNITY)
Admission: RE | Admit: 2013-11-14 | Discharge: 2013-11-14 | Disposition: A | Discharge status: Home / Self Care | Payer: Medicare Other | Source: Ambulatory Visit | Attending: Family Medicine | Admitting: Family Medicine

## 2013-11-14 ENCOUNTER — Encounter: Payer: Self-pay | Admitting: Family Medicine

## 2013-11-14 VITALS — BP 181/81 | HR 92 | Temp 97.9°F | Ht 66.0 in | Wt 166.0 lb

## 2013-11-14 DIAGNOSIS — R079 Chest pain, unspecified: Secondary | ICD-10-CM

## 2013-11-14 DIAGNOSIS — J019 Acute sinusitis, unspecified: Secondary | ICD-10-CM

## 2013-11-14 MED ORDER — HYDROCODONE-HOMATROPINE 5-1.5 MG/5ML PO SYRP: 2.5000 mL | ORAL_SOLUTION | Freq: Three times a day (TID) | ORAL | Status: DC | PRN

## 2013-11-14 MED ORDER — AZITHROMYCIN 250 MG PO TABS: ORAL_TABLET | ORAL | Status: DC

## 2013-11-14 NOTE — Assessment & Plan Note (Signed)
Likely URI related, atypical EKG today unchanged from previous except for the addition of PACs Reviewed red flags in detail, followup with PCP as planned previously

## 2013-11-14 NOTE — Patient Instructions (Signed)
It was great to meet you  Your heart looks perfect  I have prescribed and antibiotic and pain/cough medicine for you.

## 2013-11-14 NOTE — Progress Notes (Signed)
Patient ID: Cheryl Anderson, female   DOB: [birth year], 80 y.o.   MRN: [id number]  Kevin Fenton, MD Phone: [phone]  Subjective:  Chief complaint-noted  Patient here for SDA for chest pain, cough, congestion, left facial pain  States her pain started about 6 days ago followed by cough, congestion, hoarseness, left-sided facial pain. It has continued on and slightly improved over the last one to 2 days. She states that her left facial pain is continued.  She denies history of MI, and describes that this all started with chest pain described as mid sternal aching pain lasted about 30 minutes that self resolved and has recurred several times since. She denies associated nausea, vomiting, diaphoresis, clamminess, radiation of the pain. She has several sick contacts with similar symptoms. She denies dyspnea.   ROS- No fever, chills Positive sweats intermittently, not associated with chest pain No dyspnea Positive chest pain as above No abdominal pain Normal by mouth intake Some slight dizziness   Past Medical History Patient Active Problem List   Diagnosis Date Noted  . Acute sinusitis 11/14/2013  . Chest pain 11/14/2013  . Unspecified vitamin D deficiency 08/20/2013  . Osteoporosis, unspecified 07/25/2013  . Cold intolerance 07/25/2013  . Bilateral leg pain 07/25/2013  . Positive PPD 02/10/2011  . Subcutaneous nodule of chest wall 12/15/2010  . Hyperlipidemia LDL goal < 160 12/27/2006  . TOBACCO USE 12/27/2006  . HYPERTENSION, BENIGN ESSENTIAL 12/27/2006  . CHRONIC OBSTRUCTIVE PULMONARY DISEASE 12/27/2006  . ANGINA, UNSTABLE 12/07/2006  . GASTROESOPHAGEAL REFLUX, NO ESOPHAGITIS 12/07/2006    Medications- reviewed and updated Current Outpatient Prescriptions  Medication Sig Dispense Refill  . alendronate (FOSAMAX) 70 MG tablet TAKE 1 EVERY 7 DAYS ALONE, WITH A FULL GLASS OF WATER ON EMPTY STOMACH. DO NOT LIE DOWN X30 MINUTES  4 tablet  2  . aspirin 81 MG tablet Take 81  mg by mouth daily.        Marland Kitchen atorvastatin (LIPITOR) 80 MG tablet Take 1 tablet (80 mg total) by mouth daily.  30 tablet  11  . azithromycin (ZITHROMAX) 250 MG tablet Take 2 pills on day 1 and 1 pill daily after that  6 tablet  0  . Calcium Carbonate-Vitamin D 600-400 MG-UNIT per chew tablet Chew 1 tablet by mouth 2 (two) times daily.  120 tablet  12  . esomeprazole (NEXIUM) 40 MG capsule Take 1 capsule (40 mg total) by mouth daily before breakfast.  30 capsule  11  . gabapentin (NEURONTIN) 300 MG capsule Take 1 capsule (300 mg total) by mouth at bedtime. For RLS  30 capsule  5  . hydrochlorothiazide (HYDRODIURIL) 25 MG tablet       . hydrochlorothiazide (HYDRODIURIL) 25 MG tablet TAKE 1 TABLET (25 MG TOTAL) BY MOUTH DAILY.  30 tablet  6  . HYDROcodone-homatropine (HYCODAN) 5-1.5 MG/5ML syrup Take 2.5-5 mLs by mouth every 8 (eight) hours as needed for cough.  120 mL  0  . isosorbide mononitrate (IMDUR) 30 MG 24 hr tablet EVERY DAY  30 tablet  6  . loratadine (CLARITIN) 10 MG tablet Take 1 tablet (10 mg total) by mouth daily.  30 tablet  11  . nitroGLYCERIN (NITROSTAT) 0.4 MG SL tablet Take every 5 minutes for 3 doses. If pain not resolved, call 911 or go to ER.  30 tablet  2  . ramipril (ALTACE) 10 MG capsule TAKE 1 (10 MG TOTAL) BY MOUTH DAILY.  30 capsule  5  . varenicline (CHANTIX STARTING MONTH  PAK) 0.5 MG X 11 & 1 MG X 42 tablet Take one 0.5 mg tablet by mouth once daily for 3 days, then increase to one 0.5 mg tablet twice daily for 4 days, then increase to one 1 mg tablet twice daily.  53 tablet  0   No current facility-administered medications for this visit.    Objective: BP 181/81  Pulse 92  Temp(Src) 97.9 F (36.6 C) (Oral)  Ht 5\' 6"  (1.676 m)  Wt 166 lb (75.297 kg)  BMI 26.81 kg/m2 Gen: NAD, alert, cooperative with exam HEENT: NCAT, TMs WNL BL, Sinus mucosa swollen and boggy on L with some l sided facial pain to palpation of maxilary sinus. Some mild erythema on pharyngeal mucosa,  no tonislar exudates CV: RRR, good S1/S2, no murmur Resp: CTABL, no wheezes, non-labored Neuro: Alert and oriented, No gross deficits   Assessment/Plan:  Acute sinusitis With physical exam and course of illness will treat with azithromycin.  In context of viral URI Hycodan for cough and aches/pains.  Possibly flu related in the beginning but outside tanmiflu window and sinusitis appears to be a discrete problem now  Chest pain Likely URI related, atypical EKG today unchanged from previous except for the addition of PACs Reviewed red flags in detail, followup with PCP as planned previously     Orders Placed This Encounter  Procedures  . EKG 12-Lead  . EKG 12-Lead    Ordered by an unspecified provider     Meds ordered this encounter  Medications  . azithromycin (ZITHROMAX) 250 MG tablet    Sig: Take 2 pills on day 1 and 1 pill daily after that    Dispense:  6 tablet    Refill:  0  . HYDROcodone-homatropine (HYCODAN) 5-1.5 MG/5ML syrup    Sig: Take 2.5-5 mLs by mouth every 8 (eight) hours as needed for cough.    Dispense:  120 mL    Refill:  0

## 2013-11-14 NOTE — Assessment & Plan Note (Addendum)
With physical exam and course of illness will treat with azithromycin.  In context of viral URI Hycodan for cough and aches/pains.  Possibly flu related in the beginning but outside tanmiflu window and sinusitis appears to be a discrete problem now

## 2013-11-17 ENCOUNTER — Other Ambulatory Visit: Payer: Self-pay | Admitting: Family Medicine

## 2013-12-20 ENCOUNTER — Telehealth: Payer: Self-pay | Admitting: *Deleted

## 2013-12-20 NOTE — Telephone Encounter (Signed)
Called pt. Reports receiving flu shot 10/14. Lorenda Hatchet.Kimmi Acocella, Renato Battleshekla

## 2014-03-13 ENCOUNTER — Encounter: Payer: Self-pay | Admitting: Home Health Services

## 2014-03-13 ENCOUNTER — Ambulatory Visit (INDEPENDENT_AMBULATORY_CARE_PROVIDER_SITE_OTHER): Payer: Medicare Other | Admitting: Home Health Services

## 2014-03-13 VITALS — BP 175/83 | HR 45 | Temp 97.2°F | Ht 66.0 in | Wt 173.0 lb

## 2014-03-13 DIAGNOSIS — Z0000 Encounter for general adult medical examination without abnormal findings: Secondary | ICD-10-CM

## 2014-03-13 NOTE — Progress Notes (Signed)
Patient here for annual wellness visit, patient reports: Risk Factors/Conditions needing evaluation or treatment: Pt. Does not have any risk factors that need evaluation.  Home Safety: Pt lives at home, with daughter and 4 grandchildren in a 1 story home.  Pt reports having smoke alarms and does not have adaptive equipment.  Other Information: Corrective lens: Pt wears daily corrective lens, does not have eye exams. Dentures: Pt has partial upper and lower dentures, does not have regular dental exams. Memory: Pt denies memory problems.  Patient's Mini Mental Score (recorded in doc. flowsheet): 21- pt has 11th grade education and low literacy.  This may influence cognitive score Bladder:  Pt denies problems with bladder control.  BMI/Exercise: We discussed BMI and strategies for weight maintenance including portion sizes, limiting sweets and increasing fruits and vegetables.  We also discussed  starting a regular exercise routine.  Med Adherence:  We discussed importance of taking medications for htn and cholesterol.  Pt reports missing some days in the past week for Lipitor.  Reports taking all other medications daily with out missed days.  ADL/IADL:  Pt reports indpendence in all functions. Balance/Gait: Pt reports 1 falls in the past year.  We discussed home safety and fall prevention.    Balance Abnormal Patient value  Sitting balance    Sit to stand    Attempts to arise    Immediate standing balance    Standing balance    Nudge    Eyes closed- Romberg    Tandem stance x unable  Back lean    Neck Rotation    360 degree turn    Sitting down     Gait Abnormal Patient value  Initiation of gait    Step length-left    Step length-right    Step height-left    Step height-right    Step symmetry    Step continuity    Path deviation    Trunk movement    Walking stance        Annual Wellness Visit Requirements Recorded Today In  Medical, family, social history Past Medical,  Family, Social History Section  Current providers Care team  Current medications Medications  Wt, BP, Ht, BMI Vital signs  Hearing assessment (welcome visit) Hearing/vision  Tobacco, alcohol, illicit drug use History  ADL Nurse Assessment  Depression Screening Nurse Assessment  Cognitive impairment Nurse Assessment  Mini Mental Status Document Flowsheet  Fall Risk Fall/Depression  Home Safety Progress Note  End of Life Planning (welcome visit) Social Documentation  Medicare preventative services Progress Note  Risk factors/conditions needing evaluation/treatment Progress Note  Personalized health advice Patient Instructions, goals, letter  Diet & Exercise Social Documentation  Emergency Contact Social Documentation  Seat Belts Social Documentation  Sun exposure/protection Social Documentation

## 2014-03-19 ENCOUNTER — Other Ambulatory Visit: Payer: Self-pay | Admitting: *Deleted

## 2014-03-19 NOTE — Telephone Encounter (Signed)
Request for 90 day supply. Terelle Dobler L, RN  

## 2014-03-21 MED ORDER — HYDROCHLOROTHIAZIDE 25 MG PO TABS: ORAL_TABLET | ORAL | Status: DC

## 2014-03-21 MED ORDER — RAMIPRIL 10 MG PO CAPS: ORAL_CAPSULE | ORAL | Status: DC

## 2014-03-21 MED ORDER — ISOSORBIDE MONONITRATE ER 30 MG PO TB24: ORAL_TABLET | ORAL | Status: DC

## 2014-04-01 ENCOUNTER — Ambulatory Visit (INDEPENDENT_AMBULATORY_CARE_PROVIDER_SITE_OTHER): Payer: Medicare Other | Admitting: Family Medicine

## 2014-04-01 ENCOUNTER — Encounter: Payer: Self-pay | Admitting: Family Medicine

## 2014-04-01 VITALS — BP 128/63 | HR 101 | Temp 98.1°F | Wt 170.0 lb

## 2014-04-01 DIAGNOSIS — L84 Corns and callosities: Secondary | ICD-10-CM | POA: Insufficient documentation

## 2014-04-01 DIAGNOSIS — M722 Plantar fascial fibromatosis: Secondary | ICD-10-CM

## 2014-04-01 HISTORY — DX: Corns and callosities: L84

## 2014-04-01 HISTORY — DX: Plantar fascial fibromatosis: M72.2

## 2014-04-01 NOTE — Patient Instructions (Signed)
See the plantar fascitis handout I gave you   Come back in 4 weeks

## 2014-04-01 NOTE — Assessment & Plan Note (Signed)
History consistent with plantar fasciitis Given handout from the sports advised her for plantar fasciitis and rehabilitation exercises Followup 3-4 weeks, if no improvement would recommend referral to sports medicine for orthotics and further evaluation Recommended ice at night Followup 3-4 weeks

## 2014-04-01 NOTE — Assessment & Plan Note (Signed)
Callus on the right heel She started down at home Recommended salicylic acid and corn pads, would refer her to podiatry if this does not improve her symptoms.

## 2014-04-01 NOTE — Progress Notes (Addendum)
Patient ID: Cheryl Anderson, female   DOB: 09/13/1929, 80 y.o.   MRN: [id number]  Kevin FentonSamuel Bradshaw, MD Phone: [phone]  Subjective:  Chief complaint-noted  Pt Here for heel pain x2 months.  She states that her left heel has been hurting her off and on for about 2 months. She states that she was previously having right heel pain that was similar to this but that has resolved.  It hurts worse after periods or rest and is the worst in the morning when she describes 10/10 pain. She intermittently has a tingly numb feeling associated with the pain. It is non radiating. She has not tried any medicine or treatments for this.   Corns She has corns/calluses on her right heel for several years she states. She's had some recent improvement with slight paring down of the callus She's not tried any other treatments for this  She's a current pack a day smoker, she smoked since she was 80 years old She states that she has stopped several times. She would like to quit and is about to start nicotine gum.  ROS-  No fever, chills, sweats No chest pain No dyspnea   Past Medical History Patient Active Problem List   Diagnosis Date Noted  . Plantar fasciitis 04/01/2014  . Callus of foot 04/01/2014  . Acute sinusitis 11/14/2013  . Chest pain 11/14/2013  . Unspecified vitamin D deficiency 08/20/2013  . Osteoporosis, unspecified 07/25/2013  . Cold intolerance 07/25/2013  . Bilateral leg pain 07/25/2013  . Positive PPD 02/10/2011  . Subcutaneous nodule of chest wall 12/15/2010  . Hyperlipidemia LDL goal < 160 12/27/2006  . TOBACCO USE 12/27/2006  . HYPERTENSION, BENIGN ESSENTIAL 12/27/2006  . CHRONIC OBSTRUCTIVE PULMONARY DISEASE 12/27/2006  . ANGINA, UNSTABLE 12/07/2006  . GASTROESOPHAGEAL REFLUX, NO ESOPHAGITIS 12/07/2006    Medications- reviewed and updated Current Outpatient Prescriptions  Medication Sig Dispense Refill  . alendronate (FOSAMAX) 70 MG tablet TAKE 1 EVERY 7 DAYS ALONE,  WITH A FULL GLASS OF WATER ON EMPTY STOMACH. DO NOT LIE DOWN X30 MINUTES  4 tablet  2  . aspirin 81 MG tablet Take 81 mg by mouth daily.        Marland Kitchen. atorvastatin (LIPITOR) 80 MG tablet Take 1 tablet (80 mg total) by mouth daily.  30 tablet  11  . azithromycin (ZITHROMAX) 250 MG tablet Take 2 pills on day 1 and 1 pill daily after that  6 tablet  0  . Calcium Carbonate-Vitamin D 600-400 MG-UNIT per chew tablet Chew 1 tablet by mouth 2 (two) times daily.  120 tablet  12  . esomeprazole (NEXIUM) 40 MG capsule Take 1 capsule (40 mg total) by mouth daily before breakfast.  30 capsule  11  . gabapentin (NEURONTIN) 300 MG capsule Take 1 capsule (300 mg total) by mouth at bedtime. For RLS  30 capsule  5  . hydrochlorothiazide (HYDRODIURIL) 25 MG tablet       . hydrochlorothiazide (HYDRODIURIL) 25 MG tablet TAKE 1 TABLET (25 MG TOTAL) BY MOUTH DAILY.  30 tablet  6  . HYDROcodone-homatropine (HYCODAN) 5-1.5 MG/5ML syrup Take 2.5-5 mLs by mouth every 8 (eight) hours as needed for cough.  120 mL  0  . isosorbide mononitrate (IMDUR) 30 MG 24 hr tablet EVERY DAY  30 tablet  6  . loratadine (CLARITIN) 10 MG tablet TAKE 1 TABLET BY MOUTH EVERY DAY  30 tablet  5  . nitroGLYCERIN (NITROSTAT) 0.4 MG SL tablet Take every 5 minutes for 3  doses. If pain not resolved, call 911 or go to ER.  30 tablet  2  . ramipril (ALTACE) 10 MG capsule TAKE 1 (10 MG TOTAL) BY MOUTH DAILY.  30 capsule  5  . varenicline (CHANTIX STARTING MONTH PAK) 0.5 MG X 11 & 1 MG X 42 tablet Take one 0.5 mg tablet by mouth once daily for 3 days, then increase to one 0.5 mg tablet twice daily for 4 days, then increase to one 1 mg tablet twice daily.  53 tablet  0   No current facility-administered medications for this visit.    Objective: BP 128/63  Pulse 101  Temp(Src) 98.1 F (36.7 C) (Oral)  Wt 170 lb (77.111 kg) Gen: NAD, alert, cooperative with exam HEENT: NCAT CV: RRR, good S1/S2, no murmur Resp: CTABL, no wheezes, non-labored Ext: No  edema, warm  Left foot with slight tenderness at the insertion of the plantar fascia, no other tenderness to palpation, erythema, or gross deformity  Right foot with approximately 5 mm 1 cm callus on plantar surface of her heel is nontender to palpation. Neuro: Alert and oriented, No gross deficits   Assessment/Plan:  Callus of foot Callus on the right heel She started down at home Recommended salicylic acid and corn pads, would refer her to podiatry if this does not improve her symptoms.   Plantar fasciitis History consistent with plantar fasciitis Given handout from the sports advised her for plantar fasciitis and rehabilitation exercises Followup 3-4 weeks, if no improvement would recommend referral to sports medicine for orthotics and further evaluation Recommended ice at night Followup 3-4 weeks

## 2014-04-07 NOTE — Progress Notes (Signed)
I have reviewed this visit and discussed with Suzanne Lineberry and agree with her documentation  

## 2014-08-14 ENCOUNTER — Ambulatory Visit: Payer: Medicare Other

## 2014-08-29 ENCOUNTER — Ambulatory Visit: Payer: Medicare Other

## 2014-09-01 ENCOUNTER — Ambulatory Visit: Payer: Medicare Other

## 2014-10-24 ENCOUNTER — Ambulatory Visit (INDEPENDENT_AMBULATORY_CARE_PROVIDER_SITE_OTHER): Payer: Medicare Other | Admitting: Family Medicine

## 2014-10-24 ENCOUNTER — Encounter: Payer: Self-pay | Admitting: Family Medicine

## 2014-10-24 VITALS — BP 180/88 | HR 69 | Temp 98.3°F | Ht 66.0 in | Wt 169.0 lb

## 2014-10-24 DIAGNOSIS — Z23 Encounter for immunization: Secondary | ICD-10-CM

## 2014-10-24 DIAGNOSIS — R05 Cough: Secondary | ICD-10-CM | POA: Diagnosis not present

## 2014-10-24 DIAGNOSIS — R059 Cough, unspecified: Secondary | ICD-10-CM

## 2014-10-24 MED ORDER — HYDROCOD POLST-CHLORPHEN POLST 10-8 MG/5ML PO LQCR: 5.0000 mL | Freq: Every evening | ORAL | Status: DC | PRN

## 2014-10-24 MED ORDER — DOXYCYCLINE HYCLATE 100 MG PO TABS: 100.0000 mg | ORAL_TABLET | Freq: Two times a day (BID) | ORAL | Status: DC

## 2014-10-24 MED ORDER — INFLUENZA VAC SPLIT QUAD 0.5 ML IM SUSY: 0.5000 mL | PREFILLED_SYRINGE | INTRAMUSCULAR | Status: DC

## 2014-10-24 NOTE — Patient Instructions (Signed)
Upper Respiratory Infection, Adult An upper respiratory infection (URI) is also sometimes known as the common cold. The upper respiratory tract includes the nose, sinuses, throat, trachea, and bronchi. Bronchi are the airways leading to the lungs. Most people improve within 1 week, but symptoms can last up to 2 weeks. A residual cough may last even longer.  CAUSES Many different viruses can infect the tissues lining the upper respiratory tract. The tissues become irritated and inflamed and often become very moist. Mucus production is also common. A cold is contagious. You can easily spread the virus to others by oral contact. This includes kissing, sharing a glass, coughing, or sneezing. Touching your mouth or nose and then touching a surface, which is then touched by another person, can also spread the virus. SYMPTOMS  Symptoms typically develop 1 to 3 days after you come in contact with a cold virus. Symptoms vary from person to person. They may include:  Runny nose.  Sneezing.  Nasal congestion.  Sinus irritation.  Sore throat.  Loss of voice (laryngitis).  Cough.  Fatigue.  Muscle aches.  Loss of appetite.  Headache.  Low-grade fever. DIAGNOSIS  You might diagnose your own cold based on familiar symptoms, since most people get a cold 2 to 3 times a year. Your caregiver can confirm this based on your exam. Most importantly, your caregiver can check that your symptoms are not due to another disease such as strep throat, sinusitis, pneumonia, asthma, or epiglottitis. Blood tests, throat tests, and X-rays are not necessary to diagnose a common cold, but they may sometimes be helpful in excluding other more serious diseases. Your caregiver will decide if any further tests are required. RISKS AND COMPLICATIONS  You may be at risk for a more severe case of the common cold if you smoke cigarettes, have chronic heart disease (such as heart failure) or lung disease (such as asthma), or if  you have a weakened immune system. The very young and very old are also at risk for more serious infections. Bacterial sinusitis, middle ear infections, and bacterial pneumonia can complicate the common cold. The common cold can worsen asthma and chronic obstructive pulmonary disease (COPD). Sometimes, these complications can require emergency medical care and may be life-threatening. PREVENTION  The best way to protect against getting a cold is to practice good hygiene. Avoid oral or hand contact with people with cold symptoms. Wash your hands often if contact occurs. There is no clear evidence that vitamin C, vitamin E, echinacea, or exercise reduces the chance of developing a cold. However, it is always recommended to get plenty of rest and practice good nutrition. TREATMENT  Treatment is directed at relieving symptoms. There is no cure. Antibiotics are not effective, because the infection is caused by a virus, not by bacteria. Treatment may include:  Increased fluid intake. Sports drinks offer valuable electrolytes, sugars, and fluids.  Breathing heated mist or steam (vaporizer or shower).  Eating chicken soup or other clear broths, and maintaining good nutrition.  Getting plenty of rest.  Using gargles or lozenges for comfort.  Controlling fevers with ibuprofen or acetaminophen as directed by your caregiver.  Increasing usage of your inhaler if you have asthma. Zinc gel and zinc lozenges, taken in the first 24 hours of the common cold, can shorten the duration and lessen the severity of symptoms. Pain medicines may help with fever, muscle aches, and throat pain. A variety of non-prescription medicines are available to treat congestion and runny nose. Your caregiver   can make recommendations and may suggest nasal or lung inhalers for other symptoms.  HOME CARE INSTRUCTIONS   Only take over-the-counter or prescription medicines for pain, discomfort, or fever as directed by your  caregiver.  Use a warm mist humidifier or inhale steam from a shower to increase air moisture. This may keep secretions moist and make it easier to breathe.  Drink enough water and fluids to keep your urine clear or pale yellow.  Rest as needed.  Return to work when your temperature has returned to normal or as your caregiver advises. You may need to stay home longer to avoid infecting others. You can also use a face mask and careful hand washing to prevent spread of the virus. SEEK MEDICAL CARE IF:   After the first few days, you feel you are getting worse rather than better.  You need your caregiver's advice about medicines to control symptoms.  You develop chills, worsening shortness of breath, or brown or red sputum. These may be signs of pneumonia.  You develop yellow or brown nasal discharge or pain in the face, especially when you bend forward. These may be signs of sinusitis.  You develop a fever, swollen neck glands, pain with swallowing, or white areas in the back of your throat. These may be signs of strep throat. SEEK IMMEDIATE MEDICAL CARE IF:   You have a fever.  You develop severe or persistent headache, ear pain, sinus pain, or chest pain.  You develop wheezing, a prolonged cough, cough up blood, or have a change in your usual mucus (if you have chronic lung disease).  You develop sore muscles or a stiff neck. Document Released: 03/22/2001 Document Revised: 12/19/2011 Document Reviewed: 01/01/2014 Exit[employer] Patient Information 2015 [employer]. This information is not intended to replace advice given to you by your health care provider. Make sure you discuss any questions you have with your health care provider.  

## 2014-10-24 NOTE — Progress Notes (Signed)
Subjective:     Patient ID: Sanjuana LettersMildred H Tadros, female   DOB: 12/30/1928, 81 y.o.   MRN: [id number]  HPI  Cough: since New Year's, has taken tylenol and nyquil/dayquil with some improvement, however cough has lingered. - current smoker, 60+ pack years  HTN: has not taken any medications x 1 week as she has been taking other OTC for her cold.  No HA, chest pain, visual changes or weakness currently  Past Medical History  Diagnosis Date  . Hypertension   . Hyperlipidemia   . Heart disorder   . Digestive problems   . CAD (coronary artery disease)     Review of Systems  Constitutional: Negative for fever, chills, diaphoresis, fatigue and unexpected weight change.  Respiratory: Positive for cough and shortness of breath. Negative for wheezing (none).   Cardiovascular: Negative for leg swelling.  Gastrointestinal: Negative for abdominal pain, diarrhea, constipation and anal bleeding.  Genitourinary: Positive for dysuria (baseline). Negative for urgency, decreased urine volume and difficulty urinating.  Neurological: Negative for seizures, syncope, facial asymmetry, speech difficulty 60(81 yo female with cough, CAD, HTN and very high blood pressure today not currently on any medications), numbness and headaches.       Objective:   Physical Exam  Constitutional: She appears well-developed and well-nourished. No distress.  HENT:  Mouth/Throat: Mucous membranes are moist. Pharynx is normal.  Eyes: Conjunctivae and EOM are normal.  Neck: No adenopathy.  Cardiovascular: Regular rhythm and S2 normal.   Pulmonary/Chest: Effort normal and breath sounds normal.  Abdominal: She exhibits no distension. There is no tenderness.  Musculoskeletal: Normal range of motion.  Neurological: She is alert. No cranial nerve deficit. Coordination normal.  Skin: Skin is warm. No rash noted. She is not diaphoretic. No pallor.       Assessment:     81 yo female with URI and uncontrolled HTN     Plan:      Rhunette CroftMildred was seen today for uri.  Diagnoses and associated orders for this visit:  Cough, need for lung cancer screening - CT Chest Wo Contrast; Future  URI: - Start in 1 week of symptoms do not resolve: doxycycline (VIBRA-TABS) 100 MG tablet; Take 1 tablet (100 mg total) by mouth 2 (two) times daily.   => no COPD/wheezing/hx of SOB therefore no steroids rx'd - chlorpheniramine-HYDROcodone (TUSSIONEX PENNKINETIC ER) 10-8 MG/5ML LQCR; Take 5 mLs by mouth at bedtime as needed for cough (caution due to fall risk).   => discussed fall risk, only to be taken at night to help with sleeping - RTC if symptoms worsen or for fever  HTN: restart home meds, ER warnings given, RTC 1-2 weeks for BP check at which time she may get flu shot.  => has rx for HCTZ, imdur, does not have beta block from which she would likely benefit given hx of CAD  Perry MountACOSTA,Derica Leiber ROCIO, MD

## 2014-10-28 ENCOUNTER — Telehealth: Payer: Self-pay | Admitting: Family Medicine

## 2014-10-28 NOTE — Telephone Encounter (Signed)
Pt called and would like the doctor to call in another cough syrup since the one he prescribed in not covered by her insurance. jw

## 2014-11-07 ENCOUNTER — Ambulatory Visit (INDEPENDENT_AMBULATORY_CARE_PROVIDER_SITE_OTHER): Payer: Medicare Other | Admitting: *Deleted

## 2014-11-07 VITALS — BP 140/82 | HR 88 | Temp 97.9°F

## 2014-11-07 DIAGNOSIS — Z136 Encounter for screening for cardiovascular disorders: Secondary | ICD-10-CM

## 2014-11-07 DIAGNOSIS — Z0130 Encounter for examination of blood pressure without abnormal findings: Secondary | ICD-10-CM

## 2014-11-07 DIAGNOSIS — Z23 Encounter for immunization: Secondary | ICD-10-CM

## 2014-11-07 DIAGNOSIS — R05 Cough: Secondary | ICD-10-CM

## 2014-11-07 DIAGNOSIS — R059 Cough, unspecified: Secondary | ICD-10-CM

## 2014-11-07 MED ORDER — BENZONATATE 100 MG PO CAPS: 100.0000 mg | ORAL_CAPSULE | Freq: Three times a day (TID) | ORAL | Status: DC | PRN

## 2014-11-07 NOTE — Addendum Note (Signed)
Addended by: Yolande JollyMELANCON, Emyah Roznowski G on: 11/07/2014 09:19 PM   Modules accepted: Orders

## 2014-11-07 NOTE — Progress Notes (Signed)
   Pt in nurse clinic for blood pressure check. BP 140/82 manually, heart rate 88. Pt denies any chest pain, SOB, dizziness or headache.  Pt stated she was given medication for a cough medication at last visit, but pt could not afford the medication.  Medication would cost pt $80 out of pocket.  Pt told to try Delsym or mucinex until nurse get a response form PCP.  Flu vaccine given today.  Clovis PuMartin, Tamika L, RN

## 2014-11-07 NOTE — Progress Notes (Signed)
Prescription for tessalon perles sent. Thanks for the note Tamika.   CGM

## 2014-11-24 ENCOUNTER — Telehealth: Payer: Self-pay | Admitting: Family Medicine

## 2014-11-24 NOTE — Telephone Encounter (Signed)
P[facility] to [facility] Line approx [phone] by patient, Victory DakinMildred Mcnabb. She reports that she has had some "dizziness" for the past few days, symptoms started gradually after she got up in the morning, and she has experienced some constant dizziness and lightheadedness that does not seem to be improving and not worsening. Admits having similar symptoms in the past, attributed to dehydration, otherwise denies significant history of dizziness. Denies any vertigo (no room spinning per patient). Symptoms improved with laying down, worse with standing up. Has not tried any new medications for this problem. Denies any recent medication changes or recent illness. Additionally, denies any significant associated symptoms (no CP, SOB, HA, vertigo, syncope, falls, focal weakness, numbness, tingling). Tolerating PO well, drinking well.  I advised her that dizziness can be due to a number of different causes, and [facility] would need [facility] to better determine the cause of her symptoms. She attempted to schedule to make an appointment earlier today but unable [facility] to [facility]. I advised her to continue increased fluid intake and hydration tonight, rest, and call to schedule follow-up tomorrow. Provided reassurance and gave her red flag / warning symptoms to look out for to prompt her to call back or come to ED sooner.  Saralyn PilarAlexander Bethzaida Boord, DO [facility], PGY-2

## 2014-11-26 ENCOUNTER — Telehealth: Payer: Self-pay | Admitting: Family Medicine

## 2014-11-26 ENCOUNTER — Ambulatory Visit (INDEPENDENT_AMBULATORY_CARE_PROVIDER_SITE_OTHER): Payer: Medicare Other | Admitting: Family Medicine

## 2014-11-26 ENCOUNTER — Encounter: Payer: Self-pay | Admitting: Family Medicine

## 2014-11-26 VITALS — BP 148/60 | HR 72 | Temp 97.9°F | Wt 167.0 lb

## 2014-11-26 DIAGNOSIS — R42 Dizziness and giddiness: Secondary | ICD-10-CM

## 2014-11-26 NOTE — Telephone Encounter (Signed)
Discussed with patient about stopping HCTZ and increasing water intake first. If on follow up visit next week still having symptoms may consider prescribing the medication we talked about in clinic. -Dr. Waynetta SandyWight

## 2014-11-26 NOTE — Telephone Encounter (Signed)
Will forward to MD to see which medication he wanted to call in for her.  Lynise Porr,CMA

## 2014-11-26 NOTE — Progress Notes (Signed)
   Subjective:    Patient ID: Cheryl Anderson, female    DOB: 05/04/1929, 81 y.o.   MRN: [id number]  HPI  CC: dizzy  # Dizziness:  Started about 1 week ago  Primarily when standing and moving around.   Lightheaded feeling, does not feel room or vision spinning. No changes in vision.  Improves when lying down or if standing/sitting still  Has to steady herself when she walks  No changes in medications. Was treated for cough several weeks ago and stopped taking antibiotic... Cough mostly resolved now ROS: no CP  Review of Systems   See HPI for ROS. All other systems reviewed and are negative.  Past medical history, surgical, family, and social history reviewed and updated in the EMR as appropriate. Objective:  BP 148/60 mmHg  Pulse 72  Temp(Src) 97.9 F (36.6 C) (Oral)  Wt 167 lb (75.751 kg) Vitals and nursing note reviewed  Orthostatic VS: lying 117/67 P 60, sitting 116/68 P 78, standing 123/62 P 68  General: NAD HEENT: PERRL, EOMI, no nystagmus noted. MMM.  CV: RRR, normal heart sounds Resp: clear bilaterally Ext: no edema or cyanosis Neuro: alert and oriented, no focal deficits. Symptomatically dizzy upon standing, able to ambulate but keeps hand on exam table to steady self. No nystagmus noted laying down to sitting up  Assessment & Plan:  See Problem List Documentation

## 2014-11-26 NOTE — Assessment & Plan Note (Signed)
With rather sudden onset and no complaint of vertigo, would favor orthostasis as cause of her dizziness. Orthostatics negative by measurement in clinic, but still symptomatic upon standing. As BP is on the low side, will hold hydrochlorothiazide for the next 3 days and increase water consumption to see if symptoms improved. F/u on Monday for re-check of symptoms and blood pressure. Initially discussed meclizine however pt complaint really does not seem consistent with BPPV/vertigo.

## 2014-11-26 NOTE — Telephone Encounter (Signed)
Pt was seen by Dr. Waynetta SandyWight today at 1:45 and is currently at the pharmacy to pick up a RX that was supposed to be called in, however it never was. Please call in medication / SR

## 2014-11-26 NOTE — Patient Instructions (Signed)
Temporarily STOP the Hydrochlorothiazide  Drink 4-6 oz glasses of water a day  Follow up next week for re-check.  Go slowly when changing positions and have support when walking.

## 2014-11-27 NOTE — Telephone Encounter (Signed)
Pt is aware and voiced understanding. Jazmin Hartsell,CMA  

## 2014-12-02 ENCOUNTER — Encounter: Payer: Self-pay | Admitting: Family Medicine

## 2014-12-02 ENCOUNTER — Ambulatory Visit (INDEPENDENT_AMBULATORY_CARE_PROVIDER_SITE_OTHER): Payer: Medicare Other | Admitting: Family Medicine

## 2014-12-02 VITALS — BP 132/64 | HR 65 | Temp 98.1°F | Wt 168.4 lb

## 2014-12-02 DIAGNOSIS — I10 Essential (primary) hypertension: Secondary | ICD-10-CM | POA: Diagnosis not present

## 2014-12-02 DIAGNOSIS — M25561 Pain in right knee: Secondary | ICD-10-CM | POA: Diagnosis not present

## 2014-12-02 LAB — BASIC METABOLIC PANEL
BUN: 18 mg/dL (ref 6–23)
CALCIUM: 9.3 mg/dL (ref 8.4–10.5)
CO2: 27 mEq/L (ref 19–32)
Chloride: 103 mEq/L (ref 96–112)
Creat: 1.03 mg/dL (ref 0.50–1.10)
GLUCOSE: 85 mg/dL (ref 70–99)
POTASSIUM: 4.5 meq/L (ref 3.5–5.3)
SODIUM: 138 meq/L (ref 135–145)

## 2014-12-02 NOTE — Patient Instructions (Signed)
Nice to see you. We will have you come back to see our pharmacist to do ambulatory blood pressure monitoring. We are going to advise that you take tylenol 650 mg every 6 hours as needed for pain.  If you have increased swelling, redness, warmth, or develop fever please seek medical attention.

## 2014-12-03 ENCOUNTER — Ambulatory Visit
Admission: RE | Admit: 2014-12-03 | Discharge: 2014-12-03 | Disposition: A | Discharge status: Home / Self Care | Payer: Medicare Other | Source: Ambulatory Visit | Attending: Family Medicine | Admitting: Family Medicine

## 2014-12-03 ENCOUNTER — Other Ambulatory Visit: Payer: Self-pay | Admitting: Family Medicine

## 2014-12-03 DIAGNOSIS — M25561 Pain in right knee: Secondary | ICD-10-CM

## 2014-12-03 DIAGNOSIS — M1712 Unilateral primary osteoarthritis, left knee: Secondary | ICD-10-CM | POA: Diagnosis not present

## 2014-12-04 DIAGNOSIS — M25561 Pain in right knee: Secondary | ICD-10-CM | POA: Insufficient documentation

## 2014-12-04 NOTE — Assessment & Plan Note (Signed)
Patient with 4 days of right knee pain. Initially history was worrisome for possible infectious etiology though with no fever and improvement without intervention this would seem highly unlikely. Does not appear to be gout or pseudogout given that palpation is not painful. Suspect possible unidentified acute injury vs OA. No signs of infection on exam and afebrile today. Will obtain standing plane films and advised on tylenol use for discomfort. Given return precautions.   Discussed with Dr Caryl NeverBurchette.

## 2014-12-04 NOTE — Progress Notes (Signed)
Patient ID: Cheryl Anderson Ates, female   DOB: 12/27/1928, 81 y.o.   MRN: [id number]  Marikay AlarEric Jaselle Pryer, MD Phone: [phone]  Cheryl Anderson Sabino is a 81 y.o. female who presents today for f/u.  HYPERTENSION Disease Monitoring Home BP Monitoring not checking at home Chest pain- no    Dyspnea- no Medications Compliance-  Taking ramipril and imdur. Lightheadedness-  Resolved from previously, was taken off HCTZ  Edema- no  Right knee pain: notes onset of right knee pain 4 days ago. Was swollen, red and warm. Denies injury. She denies having this previously. No fevers or chills. No history of gout or pseudogout. Not taking medications for this. Used an ice pack with some benefit. No history of DM. She notes the swelling has decreased. The redness and warmth have resolved at this time.   Patient is a nonsmoker.   ROS: Per HPI   Physical Exam Filed Vitals:   12/02/14 1612  BP: 132/64  Pulse:   Temp:     Gen: Well NAD Lungs: CTABL Nl WOB Heart: RRR  MSK: right knee with mild effusion, no warmth or erythema, non-tender to palpation, no ligamentous laxity, negative mcmurray, no baker cyst palpated, left knee no effusion, warmth, or erythema, non-tender, no ligamentous laxity, negative mcmurray, no baker cyst palpated Exts: Non edematous BL  LE, warm and well perfused.    Assessment/Plan: Please see individual problem list.  Marikay AlarEric Jakarie Pember, MD [facility] PGY-3

## 2014-12-04 NOTE — Assessment & Plan Note (Signed)
At goal on recheck, though quite elevated on initial check. Will continue current medications at this time with recheck being at goal. Will have patient come back to see Dr Raymondo BandKoval for 24 hour blood pressure monitor to determine where her BP lives.

## 2014-12-05 ENCOUNTER — Other Ambulatory Visit: Payer: Self-pay | Admitting: *Deleted

## 2014-12-05 ENCOUNTER — Telehealth: Payer: Self-pay | Admitting: *Deleted

## 2014-12-05 ENCOUNTER — Encounter: Payer: Self-pay | Admitting: *Deleted

## 2014-12-05 MED ORDER — LORATADINE 10 MG PO TABS: 10.0000 mg | ORAL_TABLET | Freq: Every day | ORAL | Status: DC

## 2014-12-05 NOTE — Telephone Encounter (Signed)
-----   Message from Glori LuisEric G Sonnenberg, MD sent at 12/05/2014  9:47 AM EST ----- Please call and inform the patient that her X-rays did not reveal a cause for her knee pain. There was not an appreciable amount of arthritis. If she has continued to have issues with pain she should follow-up with her PCP. Thanks.

## 2014-12-05 NOTE — Telephone Encounter (Signed)
Letter mailed to patient with normal xray results. Townes Fuhs,CMA

## 2014-12-15 ENCOUNTER — Ambulatory Visit (INDEPENDENT_AMBULATORY_CARE_PROVIDER_SITE_OTHER): Payer: Medicare Other | Admitting: Pharmacist

## 2014-12-15 ENCOUNTER — Encounter: Payer: Self-pay | Admitting: Pharmacist

## 2014-12-15 VITALS — BP 156/70 | HR 96 | Wt 168.0 lb

## 2014-12-15 DIAGNOSIS — M810 Age-related osteoporosis without current pathological fracture: Secondary | ICD-10-CM | POA: Diagnosis not present

## 2014-12-15 DIAGNOSIS — Z720 Tobacco use: Secondary | ICD-10-CM | POA: Diagnosis not present

## 2014-12-15 DIAGNOSIS — F17200 Nicotine dependence, unspecified, uncomplicated: Secondary | ICD-10-CM

## 2014-12-15 DIAGNOSIS — M79604 Pain in right leg: Secondary | ICD-10-CM

## 2014-12-15 DIAGNOSIS — M79605 Pain in left leg: Secondary | ICD-10-CM

## 2014-12-15 DIAGNOSIS — I10 Essential (primary) hypertension: Secondary | ICD-10-CM | POA: Diagnosis not present

## 2014-12-15 DIAGNOSIS — E785 Hyperlipidemia, unspecified: Secondary | ICD-10-CM | POA: Diagnosis not present

## 2014-12-15 MED ORDER — ALENDRONATE SODIUM 70 MG PO TABS: 70.0000 mg | ORAL_TABLET | ORAL | Status: DC

## 2014-12-15 MED ORDER — ATORVASTATIN CALCIUM 80 MG PO TABS: 80.0000 mg | ORAL_TABLET | Freq: Every day | ORAL | Status: DC

## 2014-12-15 MED ORDER — GABAPENTIN 300 MG PO CAPS: 300.0000 mg | ORAL_CAPSULE | Freq: Every day | ORAL | Status: DC

## 2014-12-15 NOTE — Patient Instructions (Addendum)
Nice to see you today!  Reduce ramipril dose to 2.5mg  daily (Do not take old ramipril 10mg  tablets.)  Follow-up visit in clinic in 5-6 months.

## 2014-12-15 NOTE — Assessment & Plan Note (Addendum)
Medication reordered today. Denies intolerance.

## 2014-12-15 NOTE — Assessment & Plan Note (Signed)
Reordered medication today. Denies intolerance.

## 2014-12-15 NOTE — Progress Notes (Signed)
S:    Patient arrives accompanied by daughter in good spirits and walking without assistance. She presents to the clinic for ambulatory blood pressure evaluation.  Diagnosed with Hypertension in the year of 2008.  Also reports continuing to smoke 1 ppd and never picking up Chantix from the pharmacy.  She was open to considering other options including the use of nicotine gum to assist a quit attempt.   Medication compliance:  Patient reports taking blood pressure medication as prescribed, but also reports running out of atorvastatin, gabapentin, and fosamax. Reports she taking Chantix due to cost.  Discussed procedure for wearing the monitor and gave patient written instructions. Monitor was placed on non-dominant arm with instructions to return in the morning.   Current BP Medications include:  Ramipril 10mg  daily, hctz on hold  Antihypertensives tried in the past include: hctz  Patient returned to the clinic in good spirits and reported no issues with ambulatory BP monitor.   O:   Last 3 Office BP readings: BP Readings from Last 3 Encounters:  12/02/14 132/64  11/26/14 148/60  11/07/14 140/82     Today's Office BP reading: 156/70 mmHg (manual reading)  ABPM Study Data: Arm Placement left arm   Overall Mean 24hr BP:   125/58 mmHg HR: 76   Daytime Mean BP:  128/61 mmHg HR: 77   Nighttime Mean BP:  114/50 mmHg HR: 75   Dipping Pattern: Yes.    Sys:   10.8%   Dia: 17.4%   [normal dipping ~10-20%]   Non-hypertensive ABPM thresholds: daytime BP <135/85 mmHg, sleeptime BP <120/70 mmHg NICE Hypertension Guidelines ([place]) using ABPM: Stage I: >135/85 mmHg, Stage 2: >150/95 mmHg)   BMET    Component Value Date/Time   NA 138 12/02/2014 1631   K 4.5 12/02/2014 1631   CL 103 12/02/2014 1631   CO2 27 12/02/2014 1631   GLUCOSE 85 12/02/2014 1631   BUN 18 12/02/2014 1631   CREATININE 1.03 12/02/2014 1631   CREATININE 1.05 08/24/2010 2032   CALCIUM 9.3 12/02/2014 1631     A/P: History of hypertension since 2008 found to have isolated systolic hypertension given 24-hour ambulatory blood pressure demonstrates controlled blood pressures with some readings concerning for hypotension with an average blood pressure of 125/58 mmHg, and a nocturnal dipping pattern that is normal.  Changes to medications:  Decreased ramipril dose from 10mg  to 2.5mg  daily. Continue to monitor for symptoms of hypotension.  Re-ordered atorvastatin, gabapentin, and fosamax as requested by patient.  Chronic tobacco abuse.  Patient did not express willingness to quit at this time.  Encouraged tobacco cessation.  Will follow up at future visits   Results reviewed and written information provided.   F/U Clinic Visit with Dr. Jaquita RectorMelancon in 5-6 months.  Total time in face-to-face counseling 30 minutes.  Patient seen with Valrie HartKristin Gray, PharmD Candidate and Babs BertinHaley Baird PharmD Resident.  .Marland Kitchen

## 2014-12-15 NOTE — Assessment & Plan Note (Signed)
Medication reordered today. Denies intolerance. 

## 2014-12-16 MED ORDER — RAMIPRIL 2.5 MG PO CAPS: 2.5000 mg | ORAL_CAPSULE | Freq: Every day | ORAL | Status: DC

## 2014-12-16 NOTE — Progress Notes (Signed)
I was the preceptor for this visit. 

## 2014-12-16 NOTE — Assessment & Plan Note (Signed)
History of hypertension since 2008 found to have isolated systolic hypertension given 24-hour ambulatory blood pressure demonstrates controlled blood pressures with some readings concerning for hypotension with an average blood pressure of 125/58 mmHg, and a nocturnal dipping pattern that is normal.  Changes to medications:  Decreased ramipril dose from 10mg  to 2.5mg  daily. Continue to monitor for symptoms of hypotension.

## 2014-12-16 NOTE — Assessment & Plan Note (Signed)
Chronic tobacco abuse.  Patient did not express willingness to quit at this time.  Encouraged tobacco cessation.  Will follow up at future visits

## 2014-12-23 ENCOUNTER — Ambulatory Visit: Payer: Medicare Other | Admitting: Family Medicine

## 2014-12-24 ENCOUNTER — Encounter: Payer: Self-pay | Admitting: Family Medicine

## 2014-12-24 ENCOUNTER — Ambulatory Visit (HOSPITAL_COMMUNITY)
Admission: RE | Admit: 2014-12-24 | Discharge: 2014-12-24 | Disposition: A | Discharge status: Home / Self Care | Payer: Medicare Other | Source: Ambulatory Visit | Attending: Family Medicine | Admitting: Family Medicine

## 2014-12-24 ENCOUNTER — Ambulatory Visit (INDEPENDENT_AMBULATORY_CARE_PROVIDER_SITE_OTHER): Payer: Medicare Other | Admitting: Family Medicine

## 2014-12-24 VITALS — BP 183/70 | HR 80 | Temp 97.7°F | Ht 66.0 in | Wt 170.5 lb

## 2014-12-24 DIAGNOSIS — G4762 Sleep related leg cramps: Secondary | ICD-10-CM | POA: Diagnosis not present

## 2014-12-24 DIAGNOSIS — M79604 Pain in right leg: Secondary | ICD-10-CM

## 2014-12-24 DIAGNOSIS — M7989 Other specified soft tissue disorders: Secondary | ICD-10-CM | POA: Diagnosis not present

## 2014-12-24 DIAGNOSIS — M25561 Pain in right knee: Secondary | ICD-10-CM

## 2014-12-24 MED ORDER — CYCLOBENZAPRINE HCL 5 MG PO TABS: 5.0000 mg | ORAL_TABLET | Freq: Every evening | ORAL | Status: DC | PRN

## 2014-12-24 MED ORDER — NAPROXEN 500 MG PO TABS: 500.0000 mg | ORAL_TABLET | Freq: Two times a day (BID) | ORAL | Status: DC

## 2014-12-24 NOTE — Patient Instructions (Addendum)
Thank you for coming back to clinic. Given how your Right knee and leg looks on my exam. I am concerned for possible blood clot (DVT). This is most important thing to rule out first. We [facility] to arrange appointment for this Ultrasound today. One of our on call doctors will call you with results tonight if there are any major concerns, otherwise we can provide further information this week if the results are negative. If it is positive, you will need to start a blood thinner medication, such as Xarelto. For your knee pain, I sent prescription for Naprosyn 500mg  twice daily (take with meals) for 2 weeks. This is an antiinflammatory, safe to take with Tylenol. Continue ice and elevation, relative rest. This may be an arthritis flare and may take some time to get better.  Take Xarelto 15mg  twice daily (take one tonight) to preventatively incase there is a confirmed blood clot. You will need to continue to take it twice daily for total 3 weeks if you get results that you have a blood clot. If you find out results NEGATIVE with no clot - then stop taking. This is sample bottle of Xarelto - make sure you throw it out if you are no longer needing it.  If we confirm a blood clot, please call and schedule [facility] morning for further instructions, teaching, and new prescription or samples. If symptoms significantly worse, you may benefit from hospitalization due to this problem if we determine it is a blood clot.  If you develop any significant worsening symptoms with worse swelling, more pain, difficulty breathing or chest pain, please go directly to [facility] for further evaluation, otherwise if stable but not improved please follow-up sooner with your Primary Doctor.  Saralyn PilarAlexander Karamalegos, DO [facility], PGY-2

## 2014-12-24 NOTE — Progress Notes (Signed)
VASCULAR LAB PRELIMINARY  PRELIMINARY  PRELIMINARY  PRELIMINARY  Right lower extremity venous duplex completed.    Preliminary report:  Right:  No evidence of DVT, superficial thrombosis, or Baker's cyst.  Herby Amick, RVS 12/24/2014, 6:55 PM

## 2014-12-24 NOTE — Progress Notes (Signed)
   Subjective:    Patient ID: Cheryl Anderson, female    DOB: 04/25/1929, 81 y.o.   MRN: [id number]  Patient presents for a same day appointment. History provided by patient, daughter also present.  HPI  RIGHT LOWER EXTREMITY PAIN / SWELLING: - History of chronic ankle swelling intermittently. Otherwise, no significant history of knee pain or problems. Reports prior fall 2 years ago (no significant injuries). No recent falls, trauma. No prior knee or lower ext surgery / procedures. - [facility] for same complaint 12/02/14, had bilateral standing Knee X-rays done without acute findings (mild degenerative changes, notable osseous demineralization) - Reports current symptoms started 4 weeks ago, prior to last visit. Overall does not seem to be improving, but not getting worse. Difficulty sleeping at night due to pain, takes gabapentin chronically for RLS. Describing Right knee pain as burning 2/10 and Right Calf pain (constant with intermittent worsening with activity, standing, significantly worse in evening while laying down). - Taking Tylenol 650mg  1-2x daily without relief. Tried heat (no relief), ice with some relief. Not tried any NSAIDs - Denies any prior history of DVT/PE. No recent triggers with long travel, hospitalization, or surgeries. - Denies any CP, SOB, no significant RLE erythema or warmth.  I have reviewed and updated the following as appropriate: allergies and current medications  Social Hx: - Lives at home with sister, usually has 24 hour help available - Performs all ADLs, active, walks without supportive device (no cane or walker)  Review of Systems  See above HPI    Objective:   Physical Exam  BP 183/70 mmHg  Pulse 80  Temp(Src) 97.7 F (36.5 C) (Oral)  Ht 5\' 6"  (1.676 m)  Wt 170 lb 8 oz (77.338 kg)  BMI 27.53 kg/m2  Gen - well-appearing, pleasant, cooperative, NAD Heart - RRR, no murmurs heard Lungs - CTAB, no wheezing, crackles, or rhonchi. Normal  work of breathing. MSK - Right Knee: mild stable effusion without erythema or warmth. Mild generalized anterior +TTP > over lateral joint space, no ligament laxity, +mild crepitus on ROM, full active ROM mildly limited due to pain, no palpable posterior swelling or baker cyst. Left Knee - stable, no warmth, no effusion, non-tender Ext - RLE - generalized tenderness anterior and posterior, significantly increased posterior calf tenderness with inc calf diameter, +1 non-pitting edema extending below knee to ankle, +Homan's sign, no erythema. Significant findings compared to Left lower ext - non-tender, no edema, no erythema, normal calf size Skin - warm, dry, no rashes Neuro - awake, alert, grossly non-focal     Assessment & Plan:   See specific A&P problem list for details.

## 2014-12-24 NOTE — Assessment & Plan Note (Signed)
Concern for acute/subacute RLE DVT with increased unilateral RLE abnormalities with edema (knee to ankle), localized calf tenderess, +Homan's sign. Well's DVT score 3 (high risk). No prior known history of DVT/PE. No significant recent risk factors to provoke DVT. Need to rule out DVT. Other differential includes likely OA component with flare with symptoms starting with R-knee pain and swelling. - Discussed patient with preceptor, Dr. Gwendolyn GrantWalden  Plan: 1. Ordered RLE Venous Duplex US - confirmed with insurance no prior auth needed. Called and scheduled with [facility]. Confirmed patient on schedule, to be performed tonight and will page on call resident [phone] with results if positive. 2. Given sample of Xarelto 15mg  tablets (instructed to take one tonight, pre-emptive dose, and if confirmed DVT, start 15mg  BID x 3 weeks). Confirmed last Cr 1.03 (11/2014) 3. Rx Naprosyn 500mg  BID x 2 weeks (#30 tablets) for anti-inflammatory component, continue Tylenol, ice, elevation PRN 4. Additionally rx Flexeril 5mg  tabs (may cut in half) qhs PRN for worsening leg pain at night, may be related to above concern, although could be RLS, previously tolerated Flexeril in past 5. Strict return criteria given for when to go to ED if symptoms of PE, otherwise need to [facility] if confirmed DVT for further treatment course and education. If RLE US negative and no DVT, then continue treatments (discontinue Xarelto) and follow-up with PCP 1-2 weeks PRN.

## 2014-12-26 ENCOUNTER — Telehealth: Payer: Self-pay | Admitting: Family Medicine

## 2014-12-26 NOTE — Telephone Encounter (Signed)
[facility]/14 for x 4 weeks worsening RLE pain and swelling (knee to ankle) with worse calf pain and tenderness, concern for rule out DVT (see note for details). Ordered RLE Venous Duplex, [facility] to obtain study at that time, called and arranged after hours appointment for her. Patient was given empiric treatment for likely arthritic component to R-knee pain with rx Naprosyn 500mg  BID wc x 2 weeks, Flexeril 5mg  qhs PRN for night-time muscle spasms. Additionally given significant concern for RLE DVT, discussed with preceptor Dr. Gwendolyn GrantWalden and gave patient Xarelto 15mg  BID sample (#5 pills) to start taking until Duplex US read.  Currently only preliminary read available - RLE Duplex US (No evidence DVT - see details below) VASCULAR LAB PRELIMINARY PRELIMINARY PRELIMINARY PRELIMINARY - Right lower extremity venous duplex completed.  - Preliminary report: Right: No evidence of DVT, superficial thrombosis, or Baker's cyst.  Called [facility], confirmed only prelim read available, and requested to add patient to a list to be sent directly to reading physician to be read as soon as possible.  Called patient, spoke with Victory DakinMildred Alig. She stated that her RLE pain is significantly improved on Naprosyn (now able to ambulate) and night-time spasms much improved on Flexeril. She still admits to persistent RLE swelling below the knee to her ankle, without worsening or significant improvement since 3/16. She has taken the Xarelto twice daily (total 3 doses). I advised her today to stop Xarelto given preliminary read. Recommended continued elevation and ice this weekend to help treat swelling. She has next f/u with PCP scheduled for 01/10/15. I advised her that if swelling or other symptoms persistent, she should [facility]. Otherwise if gradually improving, she could return on 4/2. Reviewed red flag criteria to return sooner or go to ED. Also notified  her that we are still waiting on "final" read for her US, and we will notify her if the status changes with any sign of DVT.  Saralyn PilarAlexander Jarell Mcewen, DO [facility], PGY-2

## 2015-01-09 ENCOUNTER — Ambulatory Visit (INDEPENDENT_AMBULATORY_CARE_PROVIDER_SITE_OTHER): Payer: Medicare Other | Admitting: Family Medicine

## 2015-01-09 ENCOUNTER — Encounter: Payer: Self-pay | Admitting: Family Medicine

## 2015-01-09 VITALS — BP 171/75 | HR 75 | Temp 97.6°F | Ht 66.0 in | Wt 171.0 lb

## 2015-01-09 DIAGNOSIS — M810 Age-related osteoporosis without current pathological fracture: Secondary | ICD-10-CM | POA: Diagnosis not present

## 2015-01-09 DIAGNOSIS — M79604 Pain in right leg: Secondary | ICD-10-CM | POA: Diagnosis not present

## 2015-01-09 DIAGNOSIS — M1711 Unilateral primary osteoarthritis, right knee: Secondary | ICD-10-CM | POA: Diagnosis not present

## 2015-01-09 DIAGNOSIS — G4762 Sleep related leg cramps: Secondary | ICD-10-CM | POA: Diagnosis not present

## 2015-01-09 DIAGNOSIS — M79605 Pain in left leg: Secondary | ICD-10-CM

## 2015-01-09 DIAGNOSIS — M7989 Other specified soft tissue disorders: Secondary | ICD-10-CM

## 2015-01-09 MED ORDER — GABAPENTIN 300 MG PO CAPS: 300.0000 mg | ORAL_CAPSULE | Freq: Every day | ORAL | Status: DC

## 2015-01-09 NOTE — Patient Instructions (Signed)
Thanks for coming in today!   You should use ice and heat alternating every 10 minutes x 4. Do this in the morning and evening.   Continue to use the gabapentin, flexeril and Tylenol for help with controlling your pain.   Do at least one of the exercises in the handout each day over the next two weeks.   Follow up in two weeks, if you do not have much improvement in your symptoms at that time, we will consider physical therapy referral and knee joint injection.    Thanks for letting us take care of you!  Sincerely,  Devota Pacealeb Jaquila Santelli, MD Family Medicine - PGY 1

## 2015-01-13 NOTE — Progress Notes (Signed)
I was the preceptor on the day of this visit.   Kyle Fletke MD  

## 2015-01-16 ENCOUNTER — Other Ambulatory Visit: Payer: Medicare Other

## 2015-01-19 ENCOUNTER — Inpatient Hospital Stay: Admission: RE | Admit: 2015-01-19 | Payer: Medicare Other | Source: Ambulatory Visit

## 2015-01-23 ENCOUNTER — Ambulatory Visit: Payer: Medicare Other | Admitting: Family Medicine

## 2015-01-27 NOTE — Progress Notes (Signed)
Patient ID: Cheryl Anderson, female   DOB: 27-Jul-1929, 81 y.o.   MRN: [id number]   [facility] Yolande Jolly, MD Phone: [phone]  Subjective:   # Osteoarthritis of the R knee.  - Pt. Complaining of swelling / pain in R knee for around one week.  - No inciting event.  - She has had pain in this knee before 2/2 arthritis.  - She denies trauma / injury in the past.  - She says that it is worse throughout the day.  - Pain is a 5-8/10.  - Does not take NSAID's 2/2 CAD.  - Pain is mostly anterior / along the joint lines.  - No posterior pain.  - Pain is worse with standing, and not worse with flexion / extension.  - Has good baseline ADL's, and is able to ambulate freely, and perform household duties without dificulty.  - Pain has limited her ADL's somewhat.  - No weakness, locking, or giving out.  - Would like to think about a joint injection.     All relevant systems were reviewed and were negative unless otherwise noted in the HPI  Past Medical History Reviewed problem list.  Medications- reviewed and updated Current Outpatient Prescriptions  Medication Sig Dispense Refill  . alendronate (FOSAMAX) 70 MG tablet Take 1 tablet (70 mg total) by mouth once a week. Take with a full glass of water on an empty stomach. 4 tablet 10  . aspirin 81 MG tablet Take 81 mg by mouth daily.      Marland Kitchen atorvastatin (LIPITOR) 80 MG tablet Take 1 tablet (80 mg total) by mouth daily. 30 tablet 11  . Calcium Carbonate-Vitamin D 600-400 MG-UNIT per chew tablet Chew 1 tablet by mouth 2 (two) times daily. 120 tablet 12  . cyclobenzaprine (FLEXERIL) 5 MG tablet Take 1 tablet (5 mg total) by mouth at bedtime as needed for muscle spasms. 15 tablet 0  . esomeprazole (NEXIUM) 40 MG capsule Take 1 capsule (40 mg total) by mouth daily before breakfast. 30 capsule 11  . gabapentin (NEURONTIN) 300 MG capsule Take 1 capsule (300 mg total) by mouth at bedtime. For RLS 30 capsule 11  .  isosorbide mononitrate (IMDUR) 30 MG 24 hr tablet EVERY DAY 30 tablet 6  . loratadine (CLARITIN) 10 MG tablet Take 1 tablet (10 mg total) by mouth daily. 30 tablet 5  . naproxen (NAPROSYN) 500 MG tablet Take 1 tablet (500 mg total) by mouth 2 (two) times daily with a meal. 30 tablet 0  . nitroGLYCERIN (NITROSTAT) 0.4 MG SL tablet Take every 5 minutes for 3 doses. If pain not resolved, call 911 or go to ER. 30 tablet 2  . ramipril (ALTACE) 2.5 MG capsule Take 1 capsule (2.5 mg total) by mouth daily. 90 capsule 3  . varenicline (CHANTIX STARTING MONTH PAK) 0.5 MG X 11 & 1 MG X 42 tablet Take one 0.5 mg tablet by mouth once daily for 3 days, then increase to one 0.5 mg tablet twice daily for 4 days, then increase to one 1 mg tablet twice daily. (Patient not taking: Reported on 12/15/2014) 53 tablet 0   No current facility-administered medications for this visit.   Chief complaint-noted No additions to family history Social history- patient is a non smoker  Objective: BP 171/75 mmHg  Pulse 75  Temp(Src) 97.6 F (36.4 C) (Oral)  Ht  (1.676 m)  Wt 171 lb (77.565 kg)  BMI 27.61 kg/m2 Gen: NAD, alert,  cooperative with exam HEENT: NCAT, EOMI, PERRL Neck: FROM, supple CV: RRR, good S1/S2, no murmur Resp: CTABL, no wheezes, non-labored Abd: SNTND, BS present, no guarding or organomegaly Ext: No edema, warm, normal tone, moves UE/LE spontaneously RKnee: Minimal- no swelling noted, Excellent ROM, mild medial joint line pain, no evidence of swelling, ecchymosis, redness. Excellent strength, No laxity to lachman, Anterior / Posterior drawer, negative McMurrays. Crepitus noted to exam, but no locking / catching.  L Knee: FROM, no tenderness to palpation, no effusion, little crepitus noted. Knee joint stable.  Gait: excellent, non-antalgic.  Neuro: Alert and oriented, No gross deficits Skin: no rashes no lesions  Assessment/Plan: See problem based a/p

## 2015-01-27 NOTE — Assessment & Plan Note (Signed)
A: likely some degenerative symptoms at this time. X-ray with little evidence of joint degeneration. Some crepitus to exam and medial joint line tenderness. Relief noted with Gabapentin / flexeril. No NSAID's at this time. Has not tried physical therapy or ice.   P:  - Recommending Acetamenophen, Ice, Elevation, and continued flexeril / gabapentin.  - knee exercises given  - Follow up in 2 weeks.  - May need referral to PT - May benefit from joint injection. Pt. To think about getting this done as well.

## 2015-01-30 ENCOUNTER — Ambulatory Visit (INDEPENDENT_AMBULATORY_CARE_PROVIDER_SITE_OTHER): Payer: Medicare Other | Admitting: Family Medicine

## 2015-01-30 ENCOUNTER — Encounter: Payer: Self-pay | Admitting: Family Medicine

## 2015-01-30 VITALS — BP 138/64 | HR 73 | Temp 97.7°F | Ht 66.0 in | Wt 169.0 lb

## 2015-01-30 DIAGNOSIS — M1711 Unilateral primary osteoarthritis, right knee: Secondary | ICD-10-CM

## 2015-01-30 MED ORDER — METHYLPREDNISOLONE ACETATE 40 MG/ML IJ SUSP
40.0000 mg | Freq: Once | INTRAMUSCULAR | Status: AC
  Administered 2015-01-30: 40 mg via INTRA_ARTICULAR

## 2015-01-30 NOTE — Patient Instructions (Signed)
Thanks for coming in today.   You should experience some relief in the next day or so.   We will refer you to physical therapy for strengthening of your leg.   I'll see you back in one month for follow up.   Thanks for letting us take care of you.   Sincerely,  Devota Pacealeb Numa Schroeter, MD Family Medicine - PGY 1

## 2015-02-01 DIAGNOSIS — M1711 Unilateral primary osteoarthritis, right knee: Secondary | ICD-10-CM | POA: Insufficient documentation

## 2015-02-01 NOTE — Assessment & Plan Note (Addendum)
A: pt. Here with OA of right knee, continued pain and some mild swelling after attempting home PT, Pain medications, and ice - heat therapy. She has no locking or giving way of the joint. She would like a joint injection today.   P: - Will inject with depo- medrol and with lidocaine today.  - continue home therapy and naprosyn - referred to outpatient PT as pt. Feels she could do therapy if her pain was improved.  - follow up in one month.

## 2015-02-01 NOTE — Progress Notes (Signed)
Patient ID: Cheryl Anderson Youkhana, female   DOB: 08/09/1929, 81 y.o.   MRN: [id number]   [facility] Yolande Jollyaleb G Stirling Orton, MD Phone: [phone]  Subjective:   # Follow up for Right knee Osteoarthritis.  - Pt. Here for follow up from my evaluation recently.  - Has tried home pt, Naprosyn, and ice - heat for symptom relief with little improvement.  - Has not had any trauma, and does not remember any accidents resulting in knee pain.  - She did have one accident 2 years ago during which she tripped down the stairs, but otherwise, she did not have knee pain at that time or problems.  - She says that her knee is swelling intermittently.  - No history of gout, and little pain in her left knee, but no other joint involvement otherwise.  - No history of sports, strenuous exercise or significant work load.  - would like a knee injection for some relief today.  - No fever, chills, erythema of the joint, or warmth.   All relevant systems were reviewed and were negative unless otherwise noted in the HPI  Past Medical History Reviewed problem list.  Medications- reviewed and updated Current Outpatient Prescriptions  Medication Sig Dispense Refill  . alendronate (FOSAMAX) 70 MG tablet Take 1 tablet (70 mg total) by mouth once a week. Take with a full glass of water on an empty stomach. 4 tablet 10  . aspirin 81 MG tablet Take 81 mg by mouth daily.      Marland Kitchen. atorvastatin (LIPITOR) 80 MG tablet Take 1 tablet (80 mg total) by mouth daily. 30 tablet 11  . Calcium Carbonate-Vitamin D 600-400 MG-UNIT per chew tablet Chew 1 tablet by mouth 2 (two) times daily. 120 tablet 12  . cyclobenzaprine (FLEXERIL) 5 MG tablet Take 1 tablet (5 mg total) by mouth at bedtime as needed for muscle spasms. 15 tablet 0  . esomeprazole (NEXIUM) 40 MG capsule Take 1 capsule (40 mg total) by mouth daily before breakfast. 30 capsule 11  . gabapentin (NEURONTIN) 300 MG capsule Take 1 capsule (300 mg total) by mouth at  bedtime. For RLS 30 capsule 11  . isosorbide mononitrate (IMDUR) 30 MG 24 hr tablet EVERY DAY 30 tablet 6  . loratadine (CLARITIN) 10 MG tablet Take 1 tablet (10 mg total) by mouth daily. 30 tablet 5  . naproxen (NAPROSYN) 500 MG tablet Take 1 tablet (500 mg total) by mouth 2 (two) times daily with a meal. 30 tablet 0  . nitroGLYCERIN (NITROSTAT) 0.4 MG SL tablet Take every 5 minutes for 3 doses. If pain not resolved, call 911 or go to ER. 30 tablet 2  . ramipril (ALTACE) 2.5 MG capsule Take 1 capsule (2.5 mg total) by mouth daily. 90 capsule 3  . varenicline (CHANTIX STARTING MONTH PAK) 0.5 MG X 11 & 1 MG X 42 tablet Take one 0.5 mg tablet by mouth once daily for 3 days, then increase to one 0.5 mg tablet twice daily for 4 days, then increase to one 1 mg tablet twice daily. (Patient not taking: Reported on 12/15/2014) 53 tablet 0   No current facility-administered medications for this visit.   Chief complaint-noted No additions to family history Social history- patient is a non smoker  Objective: BP 138/64 mmHg  Pulse 73  Temp(Src) 97.7 F (36.5 C) (Oral)  Ht 5\' 6"  (1.676 m)  Wt 169 lb (76.658 kg)  BMI 27.29 kg/m2 Gen: NAD, alert, cooperative with exam HEENT:  NCAT, EOMI, PERRL Neck: FROM, supple CV: RRR, good S1/S2, no murmur Resp: CTABL, no wheezes, non-labored Abd: SNTND, BS present, no guarding or organomegaly Ext: No edema, warm, normal tone, moves UE/LE spontaneously Right Knee: Pt. Here with continued pain of the right knee. Pain is mostly posteromedial, and tender along the medial joint line. She also has anterior knee pain. She has full extension with flexion to 120 degrees. Negative lachman, AD, PD, Sag, McMurray's .  Neuro: Alert and oriented, No gross deficits Skin: no rashes no lesions  Assessment/Plan: See problem based a/p  Informed consent obtained and placed in chart.  Time out performed.  Area cleaned with iodine x 3 and wiped clear with alcohol swab.  Using 25  gauge needle 1.5 inch,  1 cc Depo medrol and 4 cc's 1% Lidocaine were injected by anterior approach into the right knee joint space.  Sterile bandage placed.  Patient tolerated procedure well.  No complications.

## 2015-02-02 NOTE — Progress Notes (Signed)
I was once of the assigned preceptor of the the. 

## 2015-03-10 ENCOUNTER — Telehealth: Payer: Self-pay | Admitting: *Deleted

## 2015-03-10 ENCOUNTER — Ambulatory Visit (INDEPENDENT_AMBULATORY_CARE_PROVIDER_SITE_OTHER): Payer: Medicare Other | Admitting: Family Medicine

## 2015-03-10 ENCOUNTER — Encounter: Payer: Self-pay | Admitting: Family Medicine

## 2015-03-10 VITALS — BP 159/62 | HR 66 | Temp 98.2°F | Ht 66.0 in | Wt 171.0 lb

## 2015-03-10 DIAGNOSIS — M79675 Pain in left toe(s): Secondary | ICD-10-CM | POA: Diagnosis not present

## 2015-03-10 DIAGNOSIS — M79674 Pain in right toe(s): Secondary | ICD-10-CM | POA: Diagnosis not present

## 2015-03-10 DIAGNOSIS — M1711 Unilateral primary osteoarthritis, right knee: Secondary | ICD-10-CM

## 2015-03-10 DIAGNOSIS — R0989 Other specified symptoms and signs involving the circulatory and respiratory systems: Secondary | ICD-10-CM | POA: Diagnosis not present

## 2015-03-10 NOTE — Telephone Encounter (Signed)
Attempted to call pt several times about appt to vascular lab, no answer and unable to LM.  Contacted daughter Arville Gollison Grabe [facility] and informed of appt, she will relay message of below appt  03/12/15 @ 10:45am [facility] [phone] Dreama Kuna, Maryjo RochesterJessica Dawn

## 2015-03-10 NOTE — Assessment & Plan Note (Signed)
A: Responded well to the knee injection. She has no  / little pain this visit and this is no longer a primary concern for her. She is continuing to do the exercises that I have given her, and she will reschedule the PT appointment if she is unable to do the exercises at home.   P:  - Continue home PT - Continue NSAId's - Continue strengthening - PRN ice / heat - f/u as needed.  - Will get PT formally involved again if needed.

## 2015-03-10 NOTE — Assessment & Plan Note (Addendum)
A: pt. With pain consistent with possible PAD. Poorly palpable pulses today to exam, though extremities are warm. Hairless. No skin changes rashes / lesions. Will evaluate further given functional deficit. No DMII or peripheral neuropathy. Has CAD and other risk factors. Smoking.   P:  - ABI's for vascular sufficiency of lower extremities.  - Will f/u as needed after we get ABI's.  - On Statin / ASA - may consider trial of Cilostazol pending ABI's.

## 2015-03-10 NOTE — Patient Instructions (Signed)
Good to see you today!   I'm glad that your knee pain is much better!  Keep doing the exercises at home that you have been doing.   We will reschedule your physical therapy appointment if home exercises don't work.   We will get an Ankle Brachial Index to test your blood flow to your feet.   We will see you back in 4-6 months or as needed otherwise.   Thanks for letting us take care of you!  Sincerely,  Devota Pacealeb Krrish Freund, MD Family Medicine - PGY 1

## 2015-03-10 NOTE — Progress Notes (Signed)
[facility] Yolande Jollyaleb G Emanuelle Bastos, MD Phone: [phone]  Subjective:   # Right Knee Osteoarthritis - Pt. Here for follow up of knee pain due to arthritis of her right knee.  - She has experienced significant improvement in her pain with lidocaine / depomedrol injection from last visit.  - She says that it doesn't bother her nearly as much any more.  - She continues to use NSAID's for pain control - She has been doing the knee strengthening exercises that I gave to her at her last visit.  - She has not been able to follow up with physical therapy however.   # Bilateral foot pain / tingling  - Pt. Says that her main complaint now is that she gets aching / tingling pain like her feet are falling asleep after walking around. She gets this sensation after walking some distance usually around more than 10 minutes.  - She says that it keeps her from being able to function well at home and being able to get out.  - She says that she experiences the sensation of her feet falling asleep when she is getting in bed at night, and that she sometimes has to rest them on the side of the bed out from under the covers in order to get sensation back in her feet.  - She does have risk factors for PAD. Smoking, CAD, HLD.  - She says that her symptoms have been going on for some time, but that they have been getting worse lately.  - No calf pain or buttock pain. She says her pain is in her lower calf / achiles down to her feet.  - She is currently taking a statin / aspirin.   All relevant systems were reviewed and were negative unless otherwise noted in the HPI  Past Medical History Reviewed problem list.  Medications- reviewed and updated Current Outpatient Prescriptions  Medication Sig Dispense Refill  . alendronate (FOSAMAX) 70 MG tablet Take 1 tablet (70 mg total) by mouth once a week. Take with a full glass of water on an empty stomach. 4 tablet 10  . aspirin 81 MG tablet Take 81  mg by mouth daily.      Marland Kitchen. atorvastatin (LIPITOR) 80 MG tablet Take 1 tablet (80 mg total) by mouth daily. 30 tablet 11  . Calcium Carbonate-Vitamin D 600-400 MG-UNIT per chew tablet Chew 1 tablet by mouth 2 (two) times daily. 120 tablet 12  . cyclobenzaprine (FLEXERIL) 5 MG tablet Take 1 tablet (5 mg total) by mouth at bedtime as needed for muscle spasms. 15 tablet 0  . esomeprazole (NEXIUM) 40 MG capsule Take 1 capsule (40 mg total) by mouth daily before breakfast. 30 capsule 11  . gabapentin (NEURONTIN) 300 MG capsule Take 1 capsule (300 mg total) by mouth at bedtime. For RLS 30 capsule 11  . isosorbide mononitrate (IMDUR) 30 MG 24 hr tablet EVERY DAY 30 tablet 6  . loratadine (CLARITIN) 10 MG tablet Take 1 tablet (10 mg total) by mouth daily. 30 tablet 5  . naproxen (NAPROSYN) 500 MG tablet Take 1 tablet (500 mg total) by mouth 2 (two) times daily with a meal. 30 tablet 0  . nitroGLYCERIN (NITROSTAT) 0.4 MG SL tablet Take every 5 minutes for 3 doses. If pain not resolved, call 911 or go to ER. 30 tablet 2  . ramipril (ALTACE) 2.5 MG capsule Take 1 capsule (2.5 mg total) by mouth daily. 90 capsule 3  . varenicline (  CHANTIX STARTING MONTH PAK) 0.5 MG X 11 & 1 MG X 42 tablet Take one 0.5 mg tablet by mouth once daily for 3 days, then increase to one 0.5 mg tablet twice daily for 4 days, then increase to one 1 mg tablet twice daily. (Patient not taking: Reported on 12/15/2014) 53 tablet 0   No current facility-administered medications for this visit.   Chief complaint-noted No additions to family history Social history- patient is a 1ppd smoker  Objective: BP 159/62 mmHg  Pulse 66  Temp(Src) 98.2 F (36.8 C) (Oral)  Ht  (1.676 m)  Wt 171 lb (77.565 kg)  BMI 27.61 kg/m2 Gen: NAD, alert, cooperative with exam HEENT: NCAT, EOMI, PERRL Neck: FROM, supple CV: RRR, good S1/S2, no murmur Resp: CTABL, no wheezes, non-labored Abd: SNTND, BS present, no guarding or organomegaly Ext: No  edema, warm, normal tone, moves UE/LE spontaneously Right Knee: FROM, Mild effusion noted, No TTP - especially posterior which is where her pain was previously, No patellar / anterior compartment tenderness. Improved exam.  BL LE: Right ankle with some lateral TTP (old injury here - chronic exam), she does have slightly reduced DP pulses bilaterally, but they are palpable. PT pulses palpable. Extremities are warm. No signs of ulceration, cap refill < 3 sec. No calf pain with calf squeeze.  Neuro: Alert and oriented, No gross deficits Skin: no rashes no lesions  Assessment/Plan: See problem based a/p

## 2015-03-11 NOTE — Telephone Encounter (Signed)
Thanks Shanda BumpsJessica!   CGM MD

## 2015-03-12 ENCOUNTER — Ambulatory Visit (HOSPITAL_COMMUNITY)
Admission: RE | Admit: 2015-03-12 | Discharge: 2015-03-12 | Disposition: A | Discharge status: Home / Self Care | Payer: Medicare Other | Source: Ambulatory Visit | Attending: Family Medicine | Admitting: Family Medicine

## 2015-03-12 DIAGNOSIS — M79605 Pain in left leg: Secondary | ICD-10-CM | POA: Diagnosis not present

## 2015-03-12 DIAGNOSIS — R0989 Other specified symptoms and signs involving the circulatory and respiratory systems: Secondary | ICD-10-CM | POA: Diagnosis not present

## 2015-03-12 DIAGNOSIS — M79604 Pain in right leg: Secondary | ICD-10-CM

## 2015-03-12 DIAGNOSIS — I739 Peripheral vascular disease, unspecified: Secondary | ICD-10-CM | POA: Insufficient documentation

## 2015-03-12 NOTE — Progress Notes (Signed)
VASCULAR LAB PRELIMINARY  ARTERIAL  ABI completed: ABIs indicate normal arterial flow at rest, however, waveforms are abnormal and patient's description of pain suggests claudication.    RIGHT    LEFT    PRESSURE WAVEFORM  PRESSURE WAVEFORM  BRACHIAL 171 T BRACHIAL 160 T  DP 160 M DP 165 M  AT   AT    PT 144 M PT 91 M  PER   PER    GREAT TOE  NA GREAT TOE  NA    RIGHT LEFT  ABI 0.94 0.96     Cheryl Anderson, RVT 03/12/2015, 11:44 AM

## 2015-03-13 ENCOUNTER — Telehealth: Payer: Self-pay | Admitting: Family Medicine

## 2015-03-13 DIAGNOSIS — I739 Peripheral vascular disease, unspecified: Secondary | ICD-10-CM | POA: Insufficient documentation

## 2015-03-13 NOTE — Telephone Encounter (Signed)
Called Pt. To discuss her recent ABI's. She has borderline arterial insufficiency and monophasic flow. She is [age], smokes, has HTN. She is already on ASA and a statin. Advised her to reschedule the PT appointment that she missed. Will get her in with PT for improving flow / vascularization of her lower extremities with exercise first. Will follow up as needed.   CGM MD

## 2015-07-24 ENCOUNTER — Encounter: Payer: Self-pay | Admitting: Family Medicine

## 2015-07-24 ENCOUNTER — Other Ambulatory Visit: Payer: Self-pay | Admitting: Family Medicine

## 2015-07-24 ENCOUNTER — Ambulatory Visit (INDEPENDENT_AMBULATORY_CARE_PROVIDER_SITE_OTHER): Payer: Medicare Other | Admitting: Family Medicine

## 2015-07-24 ENCOUNTER — Ambulatory Visit (HOSPITAL_COMMUNITY)
Admission: RE | Admit: 2015-07-24 | Discharge: 2015-07-24 | Disposition: A | Discharge status: Home / Self Care | Payer: Medicare Other | Source: Ambulatory Visit | Attending: Family Medicine | Admitting: Family Medicine

## 2015-07-24 VITALS — BP 164/57 | HR 75 | Temp 98.2°F | Wt 164.0 lb

## 2015-07-24 DIAGNOSIS — R059 Cough, unspecified: Secondary | ICD-10-CM

## 2015-07-24 DIAGNOSIS — Z23 Encounter for immunization: Secondary | ICD-10-CM | POA: Diagnosis not present

## 2015-07-24 DIAGNOSIS — I10 Essential (primary) hypertension: Secondary | ICD-10-CM

## 2015-07-24 DIAGNOSIS — M79605 Pain in left leg: Secondary | ICD-10-CM

## 2015-07-24 DIAGNOSIS — R05 Cough: Secondary | ICD-10-CM

## 2015-07-24 DIAGNOSIS — R079 Chest pain, unspecified: Secondary | ICD-10-CM | POA: Insufficient documentation

## 2015-07-24 DIAGNOSIS — E785 Hyperlipidemia, unspecified: Secondary | ICD-10-CM

## 2015-07-24 DIAGNOSIS — I200 Unstable angina: Secondary | ICD-10-CM

## 2015-07-24 DIAGNOSIS — J209 Acute bronchitis, unspecified: Secondary | ICD-10-CM | POA: Diagnosis not present

## 2015-07-24 DIAGNOSIS — M79604 Pain in right leg: Secondary | ICD-10-CM

## 2015-07-24 DIAGNOSIS — M25561 Pain in right knee: Secondary | ICD-10-CM

## 2015-07-24 DIAGNOSIS — M810 Age-related osteoporosis without current pathological fracture: Secondary | ICD-10-CM

## 2015-07-24 MED ORDER — ISOSORBIDE MONONITRATE ER 30 MG PO TB24: ORAL_TABLET | ORAL | Status: DC

## 2015-07-24 MED ORDER — RAMIPRIL 2.5 MG PO CAPS: 2.5000 mg | ORAL_CAPSULE | Freq: Every day | ORAL | Status: DC

## 2015-07-24 MED ORDER — ALENDRONATE SODIUM 70 MG PO TABS: 70.0000 mg | ORAL_TABLET | ORAL | Status: DC

## 2015-07-24 MED ORDER — GABAPENTIN 300 MG PO CAPS: 300.0000 mg | ORAL_CAPSULE | Freq: Every day | ORAL | Status: DC

## 2015-07-24 MED ORDER — ATORVASTATIN CALCIUM 80 MG PO TABS: 80.0000 mg | ORAL_TABLET | Freq: Every day | ORAL | Status: DC

## 2015-07-24 MED ORDER — CALCIUM CARBONATE-VITAMIN D 600-400 MG-UNIT PO CHEW: 1.0000 | CHEWABLE_TABLET | Freq: Two times a day (BID) | ORAL | Status: DC

## 2015-07-24 MED ORDER — GUAIFENESIN 200 MG PO TABS: 200.0000 mg | ORAL_TABLET | ORAL | Status: DC | PRN

## 2015-07-24 MED ORDER — NAPROXEN 500 MG PO TABS: 500.0000 mg | ORAL_TABLET | Freq: Two times a day (BID) | ORAL | Status: DC

## 2015-07-24 NOTE — Telephone Encounter (Signed)
Patient requests refill for all her prescriptions. Please, follow up with Patient.

## 2015-07-24 NOTE — Patient Instructions (Signed)
Most likely this is a virus and will take time to go away on its own Go get chest ray to be sure no pneumonia Take guaifenesin 1 tablet every 4 hours as needed for cough  Follow up next week if not getting better  Be well, Dr. Pollie MeyerMcIntyre   Acute Bronchitis Bronchitis is inflammation of the airways that extend from the windpipe into the lungs (bronchi). The inflammation often causes mucus to develop. This leads to a cough, which is the most common symptom of bronchitis.  In acute bronchitis, the condition usually develops suddenly and goes away over time, usually in a couple weeks. Smoking, allergies, and asthma can make bronchitis worse. Repeated episodes of bronchitis may cause further lung problems.  CAUSES Acute bronchitis is most often caused by the same virus that causes a cold. The virus can spread from person to person (contagious) through coughing, sneezing, and touching contaminated objects. SIGNS AND SYMPTOMS   Cough.   Fever.   Coughing up mucus.   Body aches.   Chest congestion.   Chills.   Shortness of breath.   Sore throat.  DIAGNOSIS  Acute bronchitis is usually diagnosed through a physical exam. Your health care provider will also ask you questions about your medical history. Tests, such as chest X-rays, are sometimes done to rule out other conditions.  TREATMENT  Acute bronchitis usually goes away in a couple weeks. Oftentimes, no medical treatment is necessary. Medicines are sometimes given for relief of fever or cough. Antibiotic medicines are usually not needed but may be prescribed in certain situations. In some cases, an inhaler may be recommended to help reduce shortness of breath and control the cough. A cool mist vaporizer may also be used to help thin bronchial secretions and make it easier to clear the chest.  HOME CARE INSTRUCTIONS  Get plenty of rest.   Drink enough fluids to keep your urine clear or pale yellow (unless you have a medical  condition that requires fluid restriction). Increasing fluids may help thin your respiratory secretions (sputum) and reduce chest congestion, and it will prevent dehydration.   Take medicines only as directed by your health care provider.  If you were prescribed an antibiotic medicine, finish it all even if you start to feel better.  Avoid smoking and secondhand smoke. Exposure to cigarette smoke or irritating chemicals will make bronchitis worse. If you are a smoker, consider using nicotine gum or skin patches to help control withdrawal symptoms. Quitting smoking will help your lungs heal faster.   Reduce the chances of another bout of acute bronchitis by washing your hands frequently, avoiding people with cold symptoms, and trying not to touch your hands to your mouth, nose, or eyes.   Keep all follow-up visits as directed by your health care provider.  SEEK MEDICAL CARE IF: Your symptoms do not improve after 1 week of treatment.  SEEK IMMEDIATE MEDICAL CARE IF:  You develop an increased fever or chills.   You have chest pain.   You have severe shortness of breath.  You have bloody sputum.   You develop dehydration.  You faint or repeatedly feel like you are going to pass out.  You develop repeated vomiting.  You develop a severe headache. MAKE SURE YOU:   Understand these instructions.  Will watch your condition.  Will get help right away if you are not doing well or get worse.   This information is not intended to replace advice given to you by your health  care provider. Make sure you discuss any questions you have with your health care provider.   Document Released: 11/03/2004 Document Revised: 10/17/2014 Document Reviewed: 03/19/2013 Elsevier Interactive Patient Education [employer]

## 2015-07-24 NOTE — Telephone Encounter (Signed)
Will forward to MD for refills.  There are a couple of medications that need to be reviewed due to a change in what is covered.  Jazmin Hartsell,CMA

## 2015-07-26 NOTE — Progress Notes (Signed)
Patient ID: Sanjuana LettersMildred H Nishi, female   DOB: 04/24/1929, 81 y.o.   MRN: [id number] Date of Visit: 07/24/2015   HPI:  Pt presents for a same day appointment to discuss cough and cold symptoms.  Patient reports presence of symptoms for 2 weeks. No fevers. Initially had productive cough, now dry. Eating and drinking well. stooling and urinating normally. No nasal congestion or ear pain. Lucila MaineGrandson was sick with similar symptoms and is now getting better. Patient has tried some form of gelcaps which were not helping (does not know name).  Endorses some chest pain which she believes is due to coughing. Pain is not worse with exertion. Previously was hurting without actually coughing, but now only hurts with coughing. No nausea, sweating, or shortness of breath associated with the pain.    ROS: See HPI  PMFSH: history hLD, hypertension, tobacco abuse, prior angina, GERD, vit D deficiency, osteoporosis, PAD   PHYSICAL EXAM: BP 164/57 mmHg  Pulse 75  Temp(Src) 98.2 F (36.8 C) (Oral)  Wt 164 lb (74.39 kg)  SpO2 97% Gen: NAD, pleasant, cooperative HEENT: normocephalic, atraumatic, tympanic membranes bilaterally. Oropharynx clear and moist. No sinus tenderness. Nares patent with mild clear drainage. Heart: regular rate and rhythm no murmur Lungs: clear to auscultation bilaterally, normal work of breathing  Abdomen: soft, nontender to palpation  Neuro: grossly nonfocal, speech normal  ASSESSMENT/PLAN:  1. Cough - suspect viral bronchitis. Chest pain does not sound cardiac in nature, but with multiple risk factors, EKG was checked and was unchanged from baseline. No hypoxia. Will obtain CXR to ensure no pneumonia, although doubt this clinically. Will rx guaifenesin for cough relief. Follow up if not improving next week.  FOLLOW UP: F/u as needed if symptoms worsen or do not improve.   GrenadaBrittany J. Pollie MeyerMcIntyre, MD [facility]

## 2015-08-04 ENCOUNTER — Ambulatory Visit (INDEPENDENT_AMBULATORY_CARE_PROVIDER_SITE_OTHER): Payer: Medicare Other | Admitting: Family Medicine

## 2015-08-04 ENCOUNTER — Encounter: Payer: Self-pay | Admitting: Family Medicine

## 2015-08-04 VITALS — BP 170/78 | HR 72 | Temp 97.6°F | Wt 165.9 lb

## 2015-08-04 DIAGNOSIS — R42 Dizziness and giddiness: Secondary | ICD-10-CM | POA: Diagnosis not present

## 2015-08-04 DIAGNOSIS — I951 Orthostatic hypotension: Secondary | ICD-10-CM | POA: Diagnosis not present

## 2015-08-04 NOTE — Progress Notes (Signed)
    Subjective: CC: Dizziness HPI: Patient is a 81 y.o. female presenting to clinic today for same day appt. Concerns today include:  1. Dizziness Patient notes that dizziness started about 3 days ago.  She notes that she has a h/o a vertigo that was treated with medication several years ago.  She had not had symptoms since.  She notes that dizziness is worse when getting out of bed in the morning.  She denies falls, LOC, vision changes, hearing changes or sensory changes, weakness.  Social History Reviewed: non smoker. FamHx and MedHx updated.  Please see EMR. Health Maintenance: Flu shot UTD  ROS: Per HPI  Objective: Office vital signs reviewed. BP 170/78 mmHg  Pulse 72  Temp(Src) 97.6 F (36.4 C) (Oral)  Wt 165 lb 14.4 oz (75.252 kg)  SpO2 99%  Physical Examination:  General: Awake, alert, well nourished, NAD HEENT: Normal    Neck: No masses palpated. No LAD    Ears: TMs intact, normal light reflex, no erythema, no bulging    Eyes: PERRLA, EOMI, arcus senilis present b/l    Nose: nasal turbinates moist    Throat: MMM, no erythema Cardio: RRR, S1S2 heard, no murmurs appreciated Pulm: CTAB, no wheezes, rhonchi or rales, normal WOB Extremities: WWP, No edema, cyanosis or clubbing; +2 radial pulses bilaterally MSK: Normal gait and station Neuro: Strength and sensation grossly intact, CN 2-12 grossly in tact, no nystagmus on exam  Orthostatic VS for the past 24 hrs:  BP- Lying BP- Sitting BP- Standing at 0 minutes  08/04/15 1155 168/72 mmHg 158/68 mmHg 122/60 mmHg    Assessment/ Plan: 81 y.o. female with  1. Dizziness.  Likely 2/2 orthostatic hypotension.  Could consider BPPV, though story does not seem to fit.  No focal neuro deficits on exam to suggest CVA. - Encouraged PO hydration - Slow transitions between positions - Patient to consider investing in compression hose - Could consider giving low dose Meclizine if no improvement, would be cautious in the  elderly - Follow up if no improvement  2. Orthostatic hypotension.  Orthostatic VS & HPI consistent with dx.  BP elevated today but normotensive when standing.  Per patient, BP meds recently decreased and Imdur recently discontinued.   - Encouraged PO hydration - Slow transitions between positions - Patient to consider investing in compression hose - Follow up if worsening symptoms  Raliegh IpAshly M Yvonna Brun, DO PGY-2, [facility]

## 2015-08-04 NOTE — Patient Instructions (Signed)
I believe you have something called orthostatic hypotension.  This is a normal occurrence as we age.  I recommend that you get up slowly in the morning.  Consider purchasing compression hose.  Follow up with Dr Jaquita Rector in 2 weeks for blood pressure follow up.  He may need to increase your BP medication.  In the meantime, stay well hydrated.  Orthostatic Hypotension Orthostatic hypotension is a sudden drop in blood pressure. It happens when you quickly stand up from a seated or lying position. You may feel dizzy or light-headed. This can last for just a few seconds or for up to a few minutes. It is usually not a serious problem. However, if this happens frequently or gets worse, it can be a sign of something more serious. CAUSES  Different things can cause orthostatic hypotension, including:   Loss of body fluids (dehydration).  Medicines that lower blood pressure.  Sudden changes in posture, such as standing up quickly after you have been sitting or lying down.  Taking too much of your medicine. SIGNS AND SYMPTOMS   Light-headedness or dizziness.   Fainting or near-fainting.   A fast heart rate.   Weakness.   Feeling tired (fatigue).  DIAGNOSIS  Your health care provider may do several things to help diagnose your condition and identify the cause. These may include:   Taking a medical history and doing a physical exam.  Checking your blood pressure. Your health care provider will check your blood pressure when you are:  Lying down.  Sitting.  Standing.  Using tilt table testing. In this test, you lie down on a table that moves from a lying position to a standing position. You will be strapped onto the table. This test monitors your blood pressure and heart rate when you are in different positions. TREATMENT  Treatment will vary depending on the cause. Possible treatments include:   Changing the dosage of your medicines.  Wearing compression stockings on your lower  legs.  Standing up slowly after sitting or lying down.  Eating more salt.  Eating frequent, small meals.  In some cases, getting IV fluids.  Taking medicine to enhance fluid retention. HOME CARE INSTRUCTIONS  Only take over-the-counter or prescription medicines as directed by your health care provider.  Follow your health care provider's instructions for changing the dosage of your current medicines.  Do not stop or adjust your medicine on your own.  Stand up slowly after sitting or lying down. This allows your body to adjust to the different position.  Wear compression stockings as directed.  Eat extra salt as directed.  Do not add extra salt to your diet unless directed to by your health care provider.  Eat frequent, small meals.  Avoid standing suddenly after eating.  Avoid hot showers or excessive heat as directed by your health care provider.  Keep all follow-up appointments. SEEK MEDICAL CARE IF:  You continue to feel dizzy or light-headed after standing.  You feel groggy or confused.  You feel cold, clammy, or sick to your stomach (nauseous).  You have blurred vision.  You feel short of breath. SEEK IMMEDIATE MEDICAL CARE IF:   You faint after standing.  You have chest pain.  You have difficulty breathing.   You lose feeling or movement in your arms or legs.   You have slurred speech or difficulty talking, or you are unable to talk.  MAKE SURE YOU:   Understand these instructions.  Will watch your condition.  Will get  help right away if you are not doing well or get worse.   This information is not intended to replace advice given to you by your health care provider. Make sure you discuss any questions you have with your health care provider.   Document Released: 09/16/2002 Document Revised: 10/01/2013 Document Reviewed: 07/19/2013 Elsevier Interactive Patient Education [employer] Else[employer] Inc[employer]

## 2015-08-24 ENCOUNTER — Other Ambulatory Visit: Payer: Self-pay | Admitting: *Deleted

## 2015-08-24 DIAGNOSIS — M25561 Pain in right knee: Secondary | ICD-10-CM

## 2015-08-25 ENCOUNTER — Ambulatory Visit: Payer: Medicare Other | Admitting: Family Medicine

## 2015-08-25 MED ORDER — NAPROXEN 500 MG PO TABS: 500.0000 mg | ORAL_TABLET | Freq: Two times a day (BID) | ORAL | Status: DC

## 2015-12-11 ENCOUNTER — Ambulatory Visit (INDEPENDENT_AMBULATORY_CARE_PROVIDER_SITE_OTHER): Payer: PPO | Admitting: Internal Medicine

## 2015-12-11 ENCOUNTER — Encounter: Payer: Self-pay | Admitting: Internal Medicine

## 2015-12-11 VITALS — BP 136/53 | HR 78 | Temp 98.0°F | Wt 154.0 lb

## 2015-12-11 DIAGNOSIS — J069 Acute upper respiratory infection, unspecified: Secondary | ICD-10-CM | POA: Insufficient documentation

## 2015-12-11 NOTE — Assessment & Plan Note (Signed)
Has been having rhinorrhea, congestion, cough, and body aches for 6 days. Likely viral, as there are no signs of bacterial infection on exam. May be secondary the flu, given her recent exposure; however, her symptoms started 6 days ago. We discussed that there is no value to testing for flu today because we would not start Tamiflu if her flu test came back positive. Her rhinorrhea is clear and her sinuses are not tender to palpation, so I have low suspicion for acute sinusitis.  - Treat with symptomatic care. Encouraged Pt to use Tylenol, Ibuprofen, and hot tea with honey as needed - Return precautions given - Follow-up as needed or if symptoms do not improve in 1 week

## 2015-12-11 NOTE — Patient Instructions (Signed)
It was so nice to meet you! I hope you start feeling better soon.  I think you have a virus that is causing your symptoms. You can use Tylenol and Ibuprofen at home for discomfort. I would also recommend drinking hot tea with honey.  If you are still feeling bad at the end of next week, please come back to see us!  -Dr. Nancy MarusMayo

## 2015-12-11 NOTE — Progress Notes (Signed)
[facility] Phone: [phone]  Subjective:  Runny nose, congestion, cough, nausea, body aches for 6 days. Cough productive of yellow/green sputum. Nasal drainage has been clear. No fever, no vomiting, no diarrhea, no sore throat, no shortness of breath. Grandson had the flu last week. She received a flu shot this year.  ROS: See HPI for pertinent positives and negatives Past Medical History- HTN, COPD, tobacco use Reviewed problem list.  Medications- reviewed and updated Current Outpatient Prescriptions  Medication Sig Dispense Refill  . alendronate (FOSAMAX) 70 MG tablet Take 1 tablet (70 mg total) by mouth once a week. Take with a full glass of water on an empty stomach. 4 tablet 10  . aspirin 81 MG tablet Take 81 mg by mouth daily.      Marland Kitchen atorvastatin (LIPITOR) 80 MG tablet Take 1 tablet (80 mg total) by mouth daily. 30 tablet 11  . Calcium Carbonate-Vitamin D 600-400 MG-UNIT chew tablet Chew 1 tablet by mouth 2 (two) times daily. 120 tablet 12  . cyclobenzaprine (FLEXERIL) 5 MG tablet Take 1 tablet (5 mg total) by mouth at bedtime as needed for muscle spasms. 15 tablet 0  . gabapentin (NEURONTIN) 300 MG capsule Take 1 capsule (300 mg total) by mouth at bedtime. For RLS 30 capsule 11  . guaiFENesin 200 MG tablet Take 1 tablet (200 mg total) by mouth every 4 (four) hours as needed for cough or to loosen phlegm. 30 tablet 0  . isosorbide mononitrate (IMDUR) 30 MG 24 hr tablet EVERY DAY 30 tablet 6  . loratadine (CLARITIN) 10 MG tablet Take 1 tablet (10 mg total) by mouth daily. 30 tablet 5  . naproxen (NAPROSYN) 500 MG tablet Take 1 tablet (500 mg total) by mouth 2 (two) times daily with a meal. 30 tablet 0  . nitroGLYCERIN (NITROSTAT) 0.4 MG SL tablet Take every 5 minutes for 3 doses. If pain not resolved, call 911 or go to ER. 30 tablet 2  . ramipril (ALTACE) 2.5 MG capsule Take 1 capsule (2.5 mg total) by mouth daily. 90 capsule 3   No current  facility-administered medications for this visit.   Chief complaint-noted Family history reviewed for today's visit. No changes. Social history- patient is a current smoker  Objective: BP 136/53 mmHg  Pulse 78  Temp(Src) 98 F (36.7 C) (Oral)  Wt 154 lb (69.854 kg) Gen: NAD, alert, cooperative with exam HEENT: NCAT, EOMI, MMM, TMs clear, oropharynx mildly erythematous, no tenderness to palpation of the sinuses Neck: FROM, supple, no cervical lymphadenopathy CV: RRR, no murmur Resp: Occasional rhonchi auscultated, but lungs otherwise clear, no wheezes, normal work of breathing GI: SNTND, BS present, no guarding or organomegaly Msk: No edema, warm, normal tone, moves UE/LE spontaneously Neuro: Alert and oriented, no gross deficits Skin: No rashes, no lesions Psych: Appropriate behavior  Assessment/Plan: Viral URI: Has been having rhinorrhea, congestion, cough, and body aches for 6 days. Likely viral, as there are no signs of bacterial infection on exam. May be secondary the flu, given her recent exposure; however, her symptoms started 6 days ago. We discussed that there is no value to testing for flu today because we would not start Tamiflu if her flu test came back positive. Her rhinorrhea is clear and her sinuses are not tender to palpation, so I have low suspicion for acute sinusitis.  - Treat with symptomatic care. Encouraged Pt to use Tylenol, Ibuprofen, and hot tea with honey as needed - Return precautions given -  Follow-up as needed or if symptoms do not improve in 1 week   Willadean CarolKaty Marquite Attwood, MD PGY-1

## 2015-12-21 ENCOUNTER — Encounter: Payer: Medicare Other | Admitting: Family Medicine

## 2016-02-05 ENCOUNTER — Ambulatory Visit: Payer: PPO | Admitting: Family Medicine

## 2016-02-08 ENCOUNTER — Encounter: Payer: Self-pay | Admitting: Student

## 2016-02-08 ENCOUNTER — Ambulatory Visit (INDEPENDENT_AMBULATORY_CARE_PROVIDER_SITE_OTHER): Payer: PPO | Admitting: Student

## 2016-02-08 VITALS — BP 187/55 | HR 64 | Temp 98.1°F | Wt 161.0 lb

## 2016-02-08 DIAGNOSIS — I10 Essential (primary) hypertension: Secondary | ICD-10-CM

## 2016-02-08 DIAGNOSIS — M25561 Pain in right knee: Secondary | ICD-10-CM | POA: Diagnosis not present

## 2016-02-08 DIAGNOSIS — E785 Hyperlipidemia, unspecified: Secondary | ICD-10-CM | POA: Diagnosis not present

## 2016-02-08 MED ORDER — LORATADINE 10 MG PO TABS: 10.0000 mg | ORAL_TABLET | Freq: Every day | ORAL | Status: DC

## 2016-02-08 MED ORDER — NAPROXEN 500 MG PO TABS: 500.0000 mg | ORAL_TABLET | Freq: Two times a day (BID) | ORAL | Status: DC

## 2016-02-08 MED ORDER — RAMIPRIL 2.5 MG PO CAPS: 2.5000 mg | ORAL_CAPSULE | Freq: Every day | ORAL | Status: DC

## 2016-02-08 MED ORDER — ATORVASTATIN CALCIUM 80 MG PO TABS: 80.0000 mg | ORAL_TABLET | Freq: Every day | ORAL | Status: DC

## 2016-02-08 NOTE — Assessment & Plan Note (Addendum)
Asymptomatic blood pressure elevation the setting of not taking prescribed Imdur, Ramipiril.  - counseled to maintain compliance with blood pressure regimen - ED precautions discussed

## 2016-02-08 NOTE — Progress Notes (Signed)
   Subjective:    Patient ID: Cheryl Anderson, female    DOB: 09/22/1929, 82 y.o.   MRN: [id number]   CC: Right shoulder pain and swelling  HPI: 82 y/o F presenting for right shoulder pain and swelling  Shoulder pain and swelling - started one week ago - treated with tylenol for 3-4 days - no rash, decreased range of motion, fever, weakness, radiation of the pain - has had this pain before, was told it was bursitis and was given a steroid shot - pain and swelling have largely resolved  Elevated BP - did not take her blood pressure medication today   Smoking status reviewed- smokes one PPD  Review of Systems denies chest pain, SOB, changes in vision, headache, fever   Objective:  BP 187/55 mmHg  Pulse 64  Temp(Src) 98.1 F (36.7 C) (Oral)  Wt 161 lb (73.029 kg) Vitals and nursing note reviewed  General: NAD Cardiac: RRR Respiratory: CTAB, normal effort Extremities: normal shoulders bilaterally to inspection, no erythema or swelling, non tender bilaterally to palpation, normal passive and active ROM with no pain with movement, 5/5 UE strength bilaterally, Skin: warm and dry, no rashes noted Neuro: alert and oriented, no focal deficits   Assessment & Plan:    HYPERTENSION, BENIGN ESSENTIAL Asymptomatic blood pressure elevation the setting of not taking prescribed Imdur, Ramipiril.  - counseled to maintain compliance with blood pressure regimen - ED precautions discussed   Shoulder pain- Now resolved. History of swelling with questionable warmth concerning for possible bursitis vs cellulitis. However, benign exam at this time with no complaints makes current pathology unlikely - Tylenol and motrin as needed for pain   Daylan Juhnke A. Kennon RoundsHaney MD, MS Family Medicine Resident PGY-2 Pager [phone]

## 2016-02-08 NOTE — Patient Instructions (Signed)
Follow with PCP  In one month Take Tylenol and Motrin as needed for shoulder pain Your blood pressure is also elevated today in clinic. This is likely due to you not taking her blood pressure medication this morning. Please take your medications as prescribed. If you have any questions or concerns please call the office at [phone]

## 2016-03-16 ENCOUNTER — Ambulatory Visit (INDEPENDENT_AMBULATORY_CARE_PROVIDER_SITE_OTHER): Payer: PPO | Admitting: Family Medicine

## 2016-03-16 ENCOUNTER — Encounter: Payer: Self-pay | Admitting: Family Medicine

## 2016-03-16 VITALS — BP 208/61 | HR 62 | Temp 97.7°F | Ht 66.0 in | Wt 160.0 lb

## 2016-03-16 DIAGNOSIS — H8110 Benign paroxysmal vertigo, unspecified ear: Secondary | ICD-10-CM | POA: Insufficient documentation

## 2016-03-16 DIAGNOSIS — H9193 Unspecified hearing loss, bilateral: Secondary | ICD-10-CM

## 2016-03-16 DIAGNOSIS — H547 Unspecified visual loss: Secondary | ICD-10-CM | POA: Diagnosis not present

## 2016-03-16 DIAGNOSIS — H9190 Unspecified hearing loss, unspecified ear: Secondary | ICD-10-CM | POA: Insufficient documentation

## 2016-03-16 HISTORY — DX: Benign paroxysmal vertigo, unspecified ear: H81.10

## 2016-03-16 MED ORDER — MECLIZINE HCL 12.5 MG PO TABS
12.5000 mg | ORAL_TABLET | Freq: Three times a day (TID) | ORAL | Status: DC | PRN
Start: 2016-03-16 — End: 2016-04-27

## 2016-03-16 NOTE — Assessment & Plan Note (Signed)
Worsening hearing per family and patient, having trouble finding a specialist who takes her insurance - will enter referral to help her find appropriate in-network specialist

## 2016-03-16 NOTE — Assessment & Plan Note (Signed)
Wears glasses, prescription not updated in several years - refer for optho eval

## 2016-03-16 NOTE — Assessment & Plan Note (Signed)
Room spinning, worse with turning head side to side, has had vertigo in the past that responded well to meclizine - refill meclizine, caution with this given pt age and fall risk - instructed on brandt-daroff maneuver - pt to see ENT for hearing impairment and can discuss dizziness if ongoing at that time, can get Epley in their office if needed

## 2016-03-16 NOTE — Progress Notes (Signed)
   Subjective:   Sanjuana LettersMildred H Maradiaga is a 82 y.o. female with a history of HTN, vertigo, hearing loss, visual impairment here for dizziness   DIZZINESS  Feeling dizzy for 3 days. Dizziness is constant but worse when moving head side to side Feels like room spins: yes Lightheadedness when stands: no Palpitations or heart racing: no Prior dizziness: yes, "vertigo" that got better with meclizine, never had PT Medications tried: none this episode, see above Taking blood thinners: asa  Symptoms Hearing Loss: yes but this is chronic Ear Pain or fullness: no Nausea or vomiting: mild nausea Vision difficulty or double vision: unchanged Falls: no Head trauma: no Weakness in arm or leg: no Speaking problems: no Headache: no  Her daughter reports they have been trying to get her in with someone for her hearing loss and to update her eye glasses prescription but are having trouble finding anyone who takes her insurance.   ROS see HPI Smoking Status noted  Objective:  BP 208/61 mmHg  Pulse 62  Temp(Src) 97.7 F (36.5 C) (Oral)  Ht 5\' 6"  (1.676 m)  Wt 160 lb (72.576 kg)  BMI 25.84 kg/m2  Gen:  82 y.o. female in NAD HEENT: NCAT, MMM, EOMI, PERRL, anicteric sclerae CV: RRR, no MRG Resp: Non-labored, CTAB, no wheezes noted Abd: Soft, NTND, BS present, no guarding or organomegaly Ext: WWP, no edema Neuro: Alert and oriented, speech normal    Assessment & Plan:     Sanjuana LettersMildred H Delage is a 82 y.o. female here for dizziness  BPPV (benign paroxysmal positional vertigo) Room spinning, worse with turning head side to side, has had vertigo in the past that responded well to meclizine - refill meclizine, caution with this given pt age and fall risk - instructed on brandt-daroff maneuver - pt to see ENT for hearing impairment and can discuss dizziness if ongoing at that time, can get Epley in their office if needed  Hearing loss Worsening hearing per family and patient, having trouble  finding a specialist who takes her insurance - will enter referral to help her find appropriate in-network specialist  Visual impairment Wears glasses, prescription not updated in several years - refer for optho eval      Beverely LowElena Meade Hogeland, MD, MPH [facility] PGY-3 03/16/2016 3:06 PM

## 2016-03-16 NOTE — Patient Instructions (Addendum)
The Brandt-Daroff maneuver is performed in the same manner for right and left-sided posterior canalithiasis. The seated patient begins by rapidly lying on one side (1), and then waits until any provoked vertigo subsides. The patient then sits up again (2), and waits once more for vertigo to subside. The next move is to rapidly lie on the other side (3), wait for any vertigo to subside, and then sit up again (4). The sequence is repeated 10 to 20 times, up to three times a day, until the patient is asymptomatic.   Benign Positional Vertigo Vertigo is the feeling that you or your surroundings are moving when they are not. Benign positional vertigo is the most common form of vertigo. The cause of this condition is not serious (is benign). This condition is triggered by certain movements and positions (is positional). This condition can be dangerous if it occurs while you are doing something that could endanger you or others, such as driving.  CAUSES In many cases, the cause of this condition is not known. It may be caused by a disturbance in an area of the inner ear that helps your brain to sense movement and balance. This disturbance can be caused by a viral infection (labyrinthitis), head injury, or repetitive motion. RISK FACTORS This condition is more likely to develop in:  Women.  People who are 39 years of age or older. SYMPTOMS Symptoms of this condition usually happen when you move your head or your eyes in different directions. Symptoms may start suddenly, and they usually last for less than a minute. Symptoms may include:  Loss of balance and falling.  Feeling like you are spinning or moving.  Feeling like your surroundings are spinning or moving.  Nausea and vomiting.  Blurred vision.  Dizziness.  Involuntary eye movement (nystagmus). Symptoms can be mild and cause only slight annoyance, or they can be severe and interfere with daily life. Episodes of benign positional vertigo  may return (recur) over time, and they may be triggered by certain movements. Symptoms may improve over time. DIAGNOSIS This condition is usually diagnosed by medical history and a physical exam of the head, neck, and ears. You may be referred to a health care provider who specializes in ear, nose, and throat (ENT) problems (otolaryngologist) or a provider who specializes in disorders of the nervous system (neurologist). TREATMENT Usually, your health care provider will treat this by moving your head in specific positions to adjust your inner ear back to normal. Surgery may be needed in severe cases, but this is rare. In some cases, benign positional vertigo may resolve on its own in 2-4 weeks. HOME CARE INSTRUCTIONS Safety  Move slowly.Avoid sudden body or head movements.  Avoid driving.  Avoid operating heavy machinery.  Avoid doing any tasks that would be dangerous to you or others if a vertigo episode would occur.  If you have trouble walking or keeping your balance, try using a cane for stability. If you feel dizzy or unstable, sit down right away.  Return to your normal activities as told by your health care provider. Ask your health care provider what activities are safe for you. General Instructions  Take over-the-counter and prescription medicines only as told by your health care provider.  Avoid certain positions or movements as told by your health care provider.  Drink enough fluid to keep your urine clear or pale yellow.  Keep all follow-up visits as told by your health care provider. This is important. SEEK MEDICAL CARE IF:  You have a fever.  Your condition gets worse or you develop new symptoms.  Your family or friends notice any behavioral changes.  Your nausea or vomiting gets worse.  You have numbness or a "pins and needles" sensation. SEEK IMMEDIATE MEDICAL CARE IF:  You have difficulty speaking or moving.  You are always dizzy.  You faint.  You  develop severe headaches.  You have weakness in your legs or arms.  You have changes in your hearing or vision.  You develop a stiff neck.  You develop sensitivity to light.   This information is not intended to replace advice given to you by your health care provider. Make sure you discuss any questions you have with your health care provider.   Document Released: 07/04/2006 Document Revised: 06/17/2015 Document Reviewed: 01/19/2015 Elsevier Interactive Patient Education [employer]2016 Elsevier Inc.

## 2016-03-18 ENCOUNTER — Telehealth: Payer: Self-pay

## 2016-03-18 NOTE — Telephone Encounter (Signed)
Attempted to call both patient and her daughter, neither answered and neither had VM. Eye exam appt scheduled for Wednesday 03/23/16 @ 9:40 AM with Dr. Sherryle Lisolan [facility]  [address]  [place], [place] [id number] [phone].

## 2016-03-18 NOTE — Telephone Encounter (Signed)
Attempted to call patient again, no answer. Also attempted to call daugther, VM not set up.

## 2016-03-23 DIAGNOSIS — H25813 Combined forms of age-related cataract, bilateral: Secondary | ICD-10-CM | POA: Diagnosis not present

## 2016-03-24 ENCOUNTER — Telehealth: Payer: Self-pay | Admitting: Internal Medicine

## 2016-03-24 DIAGNOSIS — H25813 Combined forms of age-related cataract, bilateral: Secondary | ICD-10-CM | POA: Diagnosis not present

## 2016-03-24 DIAGNOSIS — H40213 Acute angle-closure glaucoma, bilateral: Secondary | ICD-10-CM | POA: Diagnosis not present

## 2016-03-24 NOTE — Telephone Encounter (Signed)
Emergency Line  Daughter of patient on the line, stated that she was calling regarding a surgical issues. However she had just gotten in touch the with surgeon and was on her way to take her mother to see the surgeon. Could not stay on the line anymore. Stated she was "good and did need to talk to me anymore."   Asiyah Mikell

## 2016-03-25 DIAGNOSIS — H40213 Acute angle-closure glaucoma, bilateral: Secondary | ICD-10-CM | POA: Diagnosis not present

## 2016-03-29 DIAGNOSIS — H9313 Tinnitus, bilateral: Secondary | ICD-10-CM | POA: Diagnosis not present

## 2016-03-29 DIAGNOSIS — R42 Dizziness and giddiness: Secondary | ICD-10-CM | POA: Diagnosis not present

## 2016-03-29 DIAGNOSIS — H903 Sensorineural hearing loss, bilateral: Secondary | ICD-10-CM | POA: Diagnosis not present

## 2016-03-30 DIAGNOSIS — H40213 Acute angle-closure glaucoma, bilateral: Secondary | ICD-10-CM | POA: Diagnosis not present

## 2016-04-14 DIAGNOSIS — H40213 Acute angle-closure glaucoma, bilateral: Secondary | ICD-10-CM | POA: Diagnosis not present

## 2016-04-14 DIAGNOSIS — H25813 Combined forms of age-related cataract, bilateral: Secondary | ICD-10-CM | POA: Diagnosis not present

## 2016-04-27 ENCOUNTER — Ambulatory Visit (INDEPENDENT_AMBULATORY_CARE_PROVIDER_SITE_OTHER): Payer: PPO | Admitting: Student

## 2016-04-27 ENCOUNTER — Encounter: Payer: Self-pay | Admitting: Student

## 2016-04-27 VITALS — BP 177/68 | HR 57 | Wt 158.0 lb

## 2016-04-27 DIAGNOSIS — I951 Orthostatic hypotension: Secondary | ICD-10-CM

## 2016-04-27 NOTE — Assessment & Plan Note (Signed)
Symptoms and exam finding consistent with orthostatic hypotension. Orthostatic vitals positive. She is on Imdur XL 30 mg daily and ramipril 2.5 mg daily. She is also on meclizine and Flexeril. Discontinued meclizine and Flexeril today. Given her elevated systolic blood pressure, I will leave her blood pressures medication as they are. If she continues to have symptoms, it might be a good idea to cut down on her blood pressure medication. I have discussed return precautions as noted in AVS.

## 2016-04-27 NOTE — Patient Instructions (Signed)
It was great seeing you today! We have addressed the following issues today   Lightheadedness: This is likely due to orthostatic hypotension (a drop in blood pressure when you stand from a sitting position). This can get worse with some medication and dehydration. We have discontinued meclizine and cyclobenzaprine (Flexeril). I also recommend keeping yourself rehydrated and getting up from sitting position slowly. Come back and see us if your symptoms get worse or if you have chest pain, shortness of breath, weakness, tingling or numbness in your arms or legs or speech difficulty    If we did any lab work today, and the results require attention, either me or my nurse will get in touch with you. If everything is normal, you will get a letter in mail. If you don't hear from us in two weeks, please give us a call. Otherwise, I look forward to talking with you again at our next visit. If you have any questions or concerns before then, please call the clinic at [phone].  Please bring all your medications to every doctors visit   Sign up for My Chart to have easy access to your labs results, and communication with your Primary care physician.    Please check-out at the front desk before leaving the clinic.   Take Care,    Near-Syncope Near-syncope (commonly known as near fainting) is sudden weakness, dizziness, or feeling like you might pass out. This can happen when getting up or while standing for a long time. It is caused by a sudden decrease in blood flow to the brain, which can occur for various reasons. Most of the reasons are not serious.  HOME CARE Watch your condition for any changes.  Have someone stay with you until you feel stable.  If you feel like you are going to pass out:  Lie down right away.  Prop your feet up if you can.  Breathe deeply and steadily.  Move only when the feeling has gone away. Most of the time, this feeling lasts only a few minutes. You may feel  tired for several hours.  Drink enough fluids to keep your pee (urine) clear or pale yellow.  If you are taking blood pressure or heart medicine, stand up slowly.  Follow up with your doctor as told. GET HELP RIGHT AWAY IF:   You have a severe headache.  You have unusual pain in the chest, belly (abdomen), or back.  You have bleeding from the mouth or butt (rectum), or you have black or tarry poop (stool).  You feel your heart beat differently than normal, or you have a very fast pulse.  You pass out, or you twitch and shake when you pass out.  You pass out when sitting or lying down.  You feel confused.  You have trouble walking.  You are weak.  You have vision problems. MAKE SURE YOU:   Understand these instructions.  Will watch your condition.  Will get help right away if you are not doing well or get worse.   This information is not intended to replace advice given to you by your health care provider. Make sure you discuss any questions you have with your health care provider.   Document Released: 03/14/2008 Document Revised: 10/17/2014 Document Reviewed: 03/01/2013 Elsevier Interactive Patient Education [employer]2016 Elsevier Inc.

## 2016-04-27 NOTE — Progress Notes (Signed)
   Subjective:    Patient ID: Cheryl Anderson, female    DOB: 03/17/1929, 82 y.o.   MRN: [id number]  CC: Lightheadedness  HPI #Lightheaded: This has been going on for two weeks although it is getting better most recently. This usually happens when she get up from sitting position. Denies fall. Denies chest pain, shortness of breath, palpitation, fever, dysuria, swelling in her legs, recent medication changes or taking medication over-the-counter. She reports that she was started on 3 eyedrops 3 weeks ago for cataract surgery. Denies nausea, vomiting or diarrhea. She says she is not drinking water as she is supposed to.  Off note, patient is on meclizine and Flexeril. She reports taking meclizine for vertigo but she says it is not helping. She has not taken this medication in 3 days. She reports taking Flexeril for knee pain.   Review of Systems  Per history of present illness Objective:   Physical Exam Filed Vitals:   04/27/16 1435  BP: 177/68  Pulse: 57  Weight: 158 lb (71.668 kg)   Orthostatic vitals positive Lying 166/64 pulse 50  sitting 169/64 pulse 54  standing 112/57 pulse 60  GEN: Sitting on examination table, appears well, NAD Neck: supple, no LAD CVS: RRR, normal s1 and s2, no murmurs, no edema RESP: no increased work of breathing, good air movement bilaterally, no crackles or wheeze NEURO: A&O x3, no gross defecits  PSYCH: appropriate mood and affect     Assessment & Plan:  Orthostatic hypotension Symptoms and exam finding consistent with orthostatic hypotension. Orthostatic vitals positive. She is on Imdur XL 30 mg daily and ramipril 2.5 mg daily. She is also on meclizine and Flexeril. Discontinued meclizine and Flexeril today. Given her elevated systolic blood pressure, I will leave her blood pressures medication as they are. If she continues to have symptoms, it might be a good idea to cut down on her blood pressure medication. I have discussed return precautions as  noted in AVS.    I precepted this patient with Dr. Lum BabeEniola

## 2016-05-09 DIAGNOSIS — H40213 Acute angle-closure glaucoma, bilateral: Secondary | ICD-10-CM | POA: Diagnosis not present

## 2016-05-09 DIAGNOSIS — Z961 Presence of intraocular lens: Secondary | ICD-10-CM | POA: Diagnosis not present

## 2016-05-09 DIAGNOSIS — H25811 Combined forms of age-related cataract, right eye: Secondary | ICD-10-CM | POA: Diagnosis not present

## 2016-05-09 DIAGNOSIS — H40211 Acute angle-closure glaucoma, right eye: Secondary | ICD-10-CM | POA: Diagnosis not present

## 2016-06-27 DIAGNOSIS — H40213 Acute angle-closure glaucoma, bilateral: Secondary | ICD-10-CM | POA: Diagnosis not present

## 2016-06-27 DIAGNOSIS — H40212 Acute angle-closure glaucoma, left eye: Secondary | ICD-10-CM | POA: Diagnosis not present

## 2016-06-27 DIAGNOSIS — H25812 Combined forms of age-related cataract, left eye: Secondary | ICD-10-CM | POA: Diagnosis not present

## 2016-07-09 DIAGNOSIS — Z961 Presence of intraocular lens: Secondary | ICD-10-CM | POA: Diagnosis not present

## 2016-11-01 ENCOUNTER — Ambulatory Visit (INDEPENDENT_AMBULATORY_CARE_PROVIDER_SITE_OTHER): Payer: PPO | Admitting: Obstetrics and Gynecology

## 2016-11-01 ENCOUNTER — Other Ambulatory Visit: Payer: Self-pay | Admitting: *Deleted

## 2016-11-01 ENCOUNTER — Encounter: Payer: Self-pay | Admitting: Obstetrics and Gynecology

## 2016-11-01 VITALS — BP 140/60 | HR 55 | Temp 97.4°F | Ht 66.0 in | Wt 153.0 lb

## 2016-11-01 DIAGNOSIS — I10 Essential (primary) hypertension: Secondary | ICD-10-CM

## 2016-11-01 DIAGNOSIS — M7989 Other specified soft tissue disorders: Secondary | ICD-10-CM

## 2016-11-01 DIAGNOSIS — M79604 Pain in right leg: Secondary | ICD-10-CM

## 2016-11-01 DIAGNOSIS — E785 Hyperlipidemia, unspecified: Secondary | ICD-10-CM

## 2016-11-01 DIAGNOSIS — I200 Unstable angina: Secondary | ICD-10-CM

## 2016-11-01 DIAGNOSIS — M810 Age-related osteoporosis without current pathological fracture: Secondary | ICD-10-CM

## 2016-11-01 DIAGNOSIS — M79605 Pain in left leg: Secondary | ICD-10-CM

## 2016-11-01 DIAGNOSIS — M25561 Pain in right knee: Secondary | ICD-10-CM

## 2016-11-01 DIAGNOSIS — Z9109 Other allergy status, other than to drugs and biological substances: Secondary | ICD-10-CM

## 2016-11-01 NOTE — Telephone Encounter (Signed)
Will refill medications but would like the patient to be seen within the next few months for follow up of BP and so I can meet her as her new PCP.

## 2016-11-01 NOTE — Telephone Encounter (Signed)
Patient came in to clinic today stating she needed all of her medications refilled.  Will forward to MD. Burnard HawthorneJazmin Hartsell,CMA

## 2016-11-01 NOTE — Patient Instructions (Signed)

## 2016-11-01 NOTE — Progress Notes (Signed)
   Subjective:   Patient ID: Cheryl Anderson, female    DOB: 10/29/1928, 83 y.o.   MRN: [id number]  Patient presents for Same Day Appointment  Chief Complaint  Patient presents with  . Foot Swelling    HPI: # Foot Swelling Patient presents with concerns about foot swelling for a while now. Stated that she gets bilateral leg swelling below the knee. Sometimes she has associated hand swelling. States this has been happening more frequently so she wanted to be examined. No pain with swelling. No h/o blood clots or heart failure. No h/o gout. Some associated joint pains witht he colder weather.   PMH significant for PAD, osteoporosis, OA  Review of Systems   See HPI for ROS.   History  Smoking Status  . Current Every Day Smoker  . Packs/day: 1.00  . Types: Cigarettes  Smokeless Tobacco  . Never Used    Past medical history, surgical, family, and social history reviewed and updated in the EMR as appropriate.  Objective:  BP 140/60   Pulse (!) 55   Temp 97.4 F (36.3 C) (Oral)   Ht 5\' 6"  (1.676 m)   Wt 153 lb (69.4 kg)   SpO2 98%   BMI 24.69 kg/m  Vitals and nursing note reviewed  Physical Exam  Constitutional: She is well-developed, well-nourished, and in no distress.  Cardiovascular: Regular rhythm and intact distal pulses.  Bradycardia present.   Pulmonary/Chest: Effort normal and breath sounds normal.  Musculoskeletal: Normal range of motion.  Trace edema bilaterally. No tenderness.   Skin: Skin is warm and dry. No rash noted. No erythema.    Assessment & Plan:  1. Leg swelling Minimal leg swelling today. No red flags. Most likely dependent edema. Conservative management. Advised compression stockings and leg elevation.   PATIENT EDUCATION PROVIDED: See AVS   Caryl AdaJazma Phelps, DO 11/01/2016, 2:58 PM PGY-3, [facility]

## 2016-11-02 ENCOUNTER — Ambulatory Visit: Payer: PPO | Admitting: Student

## 2016-11-02 MED ORDER — ALENDRONATE SODIUM 70 MG PO TABS: 70.0000 mg | ORAL_TABLET | ORAL | 2 refills | Status: DC

## 2016-11-02 MED ORDER — NITROGLYCERIN 0.4 MG SL SUBL: SUBLINGUAL_TABLET | SUBLINGUAL | 2 refills | Status: DC

## 2016-11-02 MED ORDER — ISOSORBIDE MONONITRATE ER 30 MG PO TB24: ORAL_TABLET | ORAL | 2 refills | Status: DC

## 2016-11-02 MED ORDER — ATORVASTATIN CALCIUM 80 MG PO TABS: 80.0000 mg | ORAL_TABLET | Freq: Every day | ORAL | 3 refills | Status: DC

## 2016-11-02 MED ORDER — NAPROXEN 500 MG PO TABS: 500.0000 mg | ORAL_TABLET | Freq: Two times a day (BID) | ORAL | 0 refills | Status: DC

## 2016-11-02 MED ORDER — RAMIPRIL 2.5 MG PO CAPS: 2.5000 mg | ORAL_CAPSULE | Freq: Every day | ORAL | 2 refills | Status: DC

## 2016-11-02 MED ORDER — CALCIUM CARBONATE-VITAMIN D 600-400 MG-UNIT PO CHEW: 1.0000 | CHEWABLE_TABLET | Freq: Two times a day (BID) | ORAL | 12 refills | Status: DC

## 2016-11-02 MED ORDER — GABAPENTIN 300 MG PO CAPS: 300.0000 mg | ORAL_CAPSULE | Freq: Every day | ORAL | 3 refills | Status: DC

## 2016-11-02 MED ORDER — LORATADINE 10 MG PO TABS: 10.0000 mg | ORAL_TABLET | Freq: Every day | ORAL | 5 refills | Status: DC

## 2016-11-02 NOTE — Telephone Encounter (Signed)
Cheryl PrimesCarol mclamb daughter called today and requested all her medications be refilled, esp her BP med.  Not sure why this wasn't requested at her visit yesterday. [facility] on [address]

## 2016-11-11 DIAGNOSIS — H40212 Acute angle-closure glaucoma, left eye: Secondary | ICD-10-CM | POA: Diagnosis not present

## 2016-11-15 ENCOUNTER — Other Ambulatory Visit: Payer: Self-pay | Admitting: Family Medicine

## 2016-11-15 DIAGNOSIS — M25561 Pain in right knee: Secondary | ICD-10-CM

## 2017-02-01 ENCOUNTER — Encounter: Payer: Self-pay | Admitting: *Deleted

## 2017-02-01 ENCOUNTER — Ambulatory Visit (INDEPENDENT_AMBULATORY_CARE_PROVIDER_SITE_OTHER): Payer: PPO | Admitting: *Deleted

## 2017-02-01 VITALS — BP 200/66 | HR 55 | Temp 98.2°F | Ht 66.0 in | Wt 157.8 lb

## 2017-02-01 DIAGNOSIS — Z23 Encounter for immunization: Secondary | ICD-10-CM

## 2017-02-01 DIAGNOSIS — Z0000 Encounter for general adult medical examination without abnormal findings: Secondary | ICD-10-CM

## 2017-02-01 NOTE — Progress Notes (Signed)
Subjective:   Cheryl Anderson is a 83 y.o. female who presents with daughter and great grandson for Medicare Annual (Subsequent) preventive examination.  Cardiac Risk Factors include: advanced age (>73men, >32 women);dyslipidemia;hypertension;smoking/ tobacco exposure     Objective:    Vitals: BP (!) 200/60 (BP Location: Left Arm, Patient Position: Sitting, Cuff Size: Normal)   Pulse (!) 55   Temp 98.2 F (36.8 C) (Oral)   Ht  (1.676 m)   Wt 157 lb 12.8 oz (71.6 kg)   SpO2 99%   BMI 25.47 kg/m   Body mass index is 25.47 kg/m.  BP recheck 200/66 left arm manually with adult cuff Patient last dose of isosorbide and altace was approx 24 hours ago. Patient denies headache, chest pain, SHOB, visual changes. Patient states she will take her BP meds as soon as she arrives home. Per Preceptor appt sched in Nurse Clinic in 2 days for BP recheck. Patient instructed to take meds at least 2 hours before that appt. First available appt made with PCP for 02/07/17 to meet and discuss BP.   Patient has no plans to quit smoking  Tobacco History  Smoking Status  . Current Every Day Smoker  . Packs/day: 1.00  . Years: 69.00  . Types: Cigarettes  Smokeless Tobacco  . Never Used     Ready to quit: No Counseling given: Yes   Past Medical History:  Diagnosis Date  . CAD (coronary artery disease)   . Digestive problems   . Heart disorder   . Hyperlipidemia   . Hypertension   . Osteoporosis    History reviewed. No pertinent surgical history. Family History  Problem Relation Age of Onset  . Cancer Sister   . Cancer Brother     Throat CA  . Diabetes Daughter    History  Sexual Activity  . Sexual activity: No    Outpatient Encounter Prescriptions as of 02/01/2017  Medication Sig  . alendronate (FOSAMAX) 70 MG tablet Take 1 tablet (70 mg total) by mouth once a week. Take with a full glass of water on an empty stomach.  Marland Kitchen aspirin 81 MG tablet Take 81 mg by mouth daily.    Marland Kitchen  atorvastatin (LIPITOR) 80 MG tablet Take 1 tablet (80 mg total) by mouth daily.  . Calcium Carbonate-Vitamin D 600-400 MG-UNIT chew tablet Chew 1 tablet by mouth 2 (two) times daily.  Marland Kitchen gabapentin (NEURONTIN) 300 MG capsule Take 1 capsule (300 mg total) by mouth at bedtime. For RLS  . isosorbide mononitrate (IMDUR) 30 MG 24 hr tablet EVERY DAY  . loratadine (CLARITIN) 10 MG tablet Take 1 tablet (10 mg total) by mouth daily.  . naproxen (NAPROSYN) 500 MG tablet TAKE 1 TABLET (500 MG TOTAL) BY MOUTH 2 (TWO) TIMES DAILY WITH A MEAL.  . nitroGLYCERIN (NITROSTAT) 0.4 MG SL tablet Take every 5 minutes for 3 doses. If pain not resolved, call 911 or go to ER.  . ramipril (ALTACE) 2.5 MG capsule Take 1 capsule (2.5 mg total) by mouth daily.  Marland Kitchen guaiFENesin 200 MG tablet Take 1 tablet (200 mg total) by mouth every 4 (four) hours as needed for cough or to loosen phlegm. (Patient not taking: Reported on 02/01/2017)   No facility-administered encounter medications on file as of 02/01/2017.     Activities of Daily Living In your present state of health, do you have any difficulty performing the following activities: 02/01/2017  Hearing? Y  Vision? N  Difficulty concentrating or making  decisions? N  Walking or climbing stairs? N  Dressing or bathing? N  Doing errands, shopping? N  Preparing Food and eating ? N  Using the Toilet? N  In the past six months, have you accidently leaked urine? N  Do you have problems with loss of bowel control? N  Managing your Medications? N  Managing your Finances? N  Housekeeping or managing your Housekeeping? N  Some recent data might be hidden   Home Safety:  My home has a working smoke alarm:  Yes X 2           My home throw rugs have been fastened down to the floor or removed:  Removed I have non-slip mats in the bathtub and shower:  Yes         All my home's stairs have railings or bannisters: One level home with no outside steps.          My home's floors,  stairs and hallways are free from clutter, wires and cords:  Yes     I wear seatbelts consistently:  Yes   Patient Care Team: Leland Her, DO as PCP - General    Assessment:     Exercise Activities and Dietary recommendations Current Exercise Habits: Home exercise routine, Time (Minutes): 30, Frequency (Times/Week): 3, Weekly Exercise (Minutes/Week): 90, Intensity: Mild  Goals    . Blood Pressure < 150/90    . Increase water intake      Fall Risk Fall Risk  02/01/2017 11/01/2016 04/27/2016 03/16/2016 02/08/2016  Falls in the past year? No No No No No  Number falls in past yr: - - - - -  Injury with Fall? - - - - -  Risk Factor Category  - - - - -   Depression Screen PHQ 2/9 Scores 02/01/2017 11/01/2016 04/27/2016 03/16/2016  PHQ - 2 Score 0 0 0 0    TUG Test:  Done in 11 seconds. Patient used left hand to push out of chair and to sit back down.  Cognitive Function: Mini-Cog  Passed with score 5/5   Cognitive Function MMSE - Mini Mental State Exam 03/13/2014  Orientation to time 5  Orientation to Place 5  Registration 3  Attention/ Calculation 0  Recall 2  Language- name 2 objects 2  Language- repeat 1  Language- follow 3 step command 3  Language- read & follow direction 0  Write a sentence 0  Copy design 0  Total score 21        Immunization History  Administered Date(s) Administered  . Influenza Split 08/17/2012  . Influenza Whole 11/02/2007, 07/16/2008, 08/28/2009, 08/23/2010  . Influenza,inj,Quad PF,36+ Mos 11/07/2014, 07/24/2015  . Influenza-Unspecified 07/22/2013, 08/10/2016  . PPD Test 12/14/2010  . Pneumococcal Polysaccharide-23 07/10/2000, 08/23/2010  . Td 04/09/2002, 01/26/2010   Screening Tests Health Maintenance  Topic Date Due  . PNA vac Low Risk Adult (2 of 2 - PCV13) 08/24/2011  . INFLUENZA VACCINE  05/10/2017  . TETANUS/TDAP  01/27/2020  . DEXA SCAN  Completed  Patient willing to receive prevnar today but held 2/2 elevated BP. Will do at Nurse  check in 2 days if BP acceptable.    Plan:     BP recheck 200/66  left arm manually with adult cuff Patient last dose of isosorbide and altace was approx 24 hours ago. Patient denies headache, chest pain, SHOB, visual changes. Patient states she will take her BP meds as soon as she arrives home. Per Preceptor, Dr. Pollie Meyer, appt  sched in Nurse Clinic in 2 days for BP recheck. Patient instructed to take meds at least 2 hours before that appt. First available appt made with PCP for 02/07/17 to meet and discuss BP.   Patient willing to receive prevnar today but held 2/2 elevated BP. Will do at Nurse check in 2 days if BP acceptable.  During the course of the visit the patient was educated and counseled about the following appropriate screening and preventive services:   Vaccines to include Pneumoccal, Influenza, Td, Zostavax/Shingrix  Cardiovascular Disease  Colorectal cancer screening  Bone density screening  Diabetes screening  Mammography/PAP  Nutrition counseling        Smoking Cessation  Patient Instructions (the written plan) was given to the patient.   Fredderick Severance, RN  02/01/2017

## 2017-02-01 NOTE — Patient Instructions (Addendum)
Fall Prevention in the Home Falls can cause injuries. They can happen to people of all ages. There are many things you can do to make your home safe and to help prevent falls. What can I do on the outside of my home?  Regularly fix the edges of walkways and driveways and fix any cracks.  Remove anything that might make you trip as you walk through a door, such as a raised step or threshold.  Trim any bushes or trees on the path to your home.  Use bright outdoor lighting.  Clear any walking paths of anything that might make someone trip, such as rocks or tools.  Regularly check to see if handrails are loose or broken. Make sure that both sides of any steps have handrails.  Any raised decks and porches should have guardrails on the edges.  Have any leaves, snow, or ice cleared regularly.  Use sand or salt on walking paths during winter.  Clean up any spills in your garage right away. This includes oil or grease spills. What can I do in the bathroom?  Use night lights.  Install grab bars by the toilet and in the tub and shower. Do not use towel bars as grab bars.  Use non-skid mats or decals in the tub or shower.  If you need to sit down in the shower, use a plastic, non-slip stool.  Keep the floor dry. Clean up any water that spills on the floor as soon as it happens.  Remove soap buildup in the tub or shower regularly.  Attach bath mats securely with double-sided non-slip rug tape.  Do not have throw rugs and other things on the floor that can make you trip. What can I do in the bedroom?  Use night lights.  Make sure that you have a light by your bed that is easy to reach.  Do not use any sheets or blankets that are too big for your bed. They should not hang down onto the floor.  Have a firm chair that has side arms. You can use this for support while you get dressed.  Do not have throw rugs and other things on the floor that can make you trip. What can I do in  the kitchen?  Clean up any spills right away.  Avoid walking on wet floors.  Keep items that you use a lot in easy-to-reach places.  If you need to reach something above you, use a strong step stool that has a grab bar.  Keep electrical cords out of the way.  Do not use floor polish or wax that makes floors slippery. If you must use wax, use non-skid floor wax.  Do not have throw rugs and other things on the floor that can make you trip. What can I do with my stairs?  Do not leave any items on the stairs.  Make sure that there are handrails on both sides of the stairs and use them. Fix handrails that are broken or loose. Make sure that handrails are as long as the stairways.  Check any carpeting to make sure that it is firmly attached to the stairs. Fix any carpet that is loose or worn.  Avoid having throw rugs at the top or bottom of the stairs. If you do have throw rugs, attach them to the floor with carpet tape.  Make sure that you have a light switch at the top of the stairs and the bottom of the stairs. If you  do not have them, ask someone to add them for you. What else can I do to help prevent falls?  Wear shoes that:  Do not have high heels.  Have rubber bottoms.  Are comfortable and fit you well.  Are closed at the toe. Do not wear sandals.  If you use a stepladder:  Make sure that it is fully opened. Do not climb a closed stepladder.  Make sure that both sides of the stepladder are locked into place.  Ask someone to hold it for you, if possible.  Clearly mark and make sure that you can see:  Any grab bars or handrails.  First and last steps.  Where the edge of each step is.  Use tools that help you move around (mobility aids) if they are needed. These include:  Canes.  Walkers.  Scooters.  Crutches.  Turn on the lights when you go into a dark area. Replace any light bulbs as soon as they burn out.  Set up your furniture so you have a clear  path. Avoid moving your furniture around.  If any of your floors are uneven, fix them.  If there are any pets around you, be aware of where they are.  Review your medicines with your doctor. Some medicines can make you feel dizzy. This can increase your chance of falling. Ask your doctor what other things that you can do to help prevent falls. This information is not intended to replace advice given to you by your health care provider. Make sure you discuss any questions you have with your health care provider. Document Released: 07/23/2009 Document Revised: 03/03/2016 Document Reviewed: 10/31/2014 Elsevier Interactive Patient Education  2017 [place] Maintenance, Female Adopting a healthy lifestyle and getting preventive care can go a long way to promote health and wellness. Talk with your health care provider about what schedule of regular examinations is right for you. This is a good chance for you to check in with your provider about disease prevention and staying healthy. In between checkups, there are plenty of things you can do on your own. Experts have done a lot of research about which lifestyle changes and preventive measures are most likely to keep you healthy. Ask your health care provider for more information. Weight and diet Eat a healthy diet  Be sure to include plenty of vegetables, fruits, low-fat dairy products, and lean protein.  Do not eat a lot of foods high in solid fats, added sugars, or salt.  Get regular exercise. This is one of the most important things you can do for your health.  Most adults should exercise for at least 150 minutes each week. The exercise should increase your heart rate and make you sweat (moderate-intensity exercise).  Most adults should also do strengthening exercises at least twice a week. This is in addition to the moderate-intensity exercise. Maintain a healthy weight  Body mass index (BMI) is a measurement that can be used  to identify possible weight problems. It estimates body fat based on height and weight. Your health care provider can help determine your BMI and help you achieve or maintain a healthy weight.  For females 45 years of age and older:  A BMI below 18.5 is considered underweight.  A BMI of 18.5 to 24.9 is normal.  A BMI of 25 to 29.9 is considered overweight.  A BMI of 30 and above is considered obese. Watch levels of cholesterol and blood lipids  You should start having  your blood tested for lipids and cholesterol at 81 years of age, then have this test every 5 years.  You may need to have your cholesterol levels checked more often if:  Your lipid or cholesterol levels are high.  You are older than 81 years of age.  You are at high risk for heart disease. Cancer screening Lung Cancer  Lung cancer screening is recommended for adults 55-80 years old who are at high risk for lung cancer because of a history of smoking.  A yearly low-dose CT scan of the lungs is recommended for people who:  Currently smoke.  Have quit within the past 15 years.  Have at least a 30-pack-year history of smoking. A pack year is smoking an average of one pack of cigarettes a day for 1 year.  Yearly screening should continue until it has been 15 years since you quit.  Yearly screening should stop if you develop a health problem that would prevent you from having lung cancer treatment. Breast Cancer  Practice breast self-awareness. This means understanding how your breasts normally appear and feel.  It also means doing regular breast self-exams. Let your health care provider know about any changes, no matter how small.  If you are in your 20s or 30s, you should have a clinical breast exam (CBE) by a health care provider every 1-3 years as part of a regular health exam.  If you are 40 or older, have a CBE every year. Also consider having a breast X-ray (mammogram) every year.  If you have a family  history of breast cancer, talk to your health care provider about genetic screening.  If you are at high risk for breast cancer, talk to your health care provider about having an MRI and a mammogram every year.  Breast cancer gene (BRCA) assessment is recommended for women who have family members with BRCA-related cancers. BRCA-related cancers include:  Breast.  Ovarian.  Tubal.  Peritoneal cancers.  Results of the assessment will determine the need for genetic counseling and BRCA1 and BRCA2 testing. Cervical Cancer  Your health care provider may recommend that you be screened regularly for cancer of the pelvic organs (ovaries, uterus, and vagina). This screening involves a pelvic examination, including checking for microscopic changes to the surface of your cervix (Pap test). You may be encouraged to have this screening done every 3 years, beginning at age 21.  For women ages 30-65, health care providers may recommend pelvic exams and Pap testing every 3 years, or they may recommend the Pap and pelvic exam, combined with testing for human papilloma virus (HPV), every 5 years. Some types of HPV increase your risk of cervical cancer. Testing for HPV may also be done on women of any age with unclear Pap test results.  Other health care providers may not recommend any screening for nonpregnant women who are considered low risk for pelvic cancer and who do not have symptoms. Ask your health care provider if a screening pelvic exam is right for you.  If you have had past treatment for cervical cancer or a condition that could lead to cancer, you need Pap tests and screening for cancer for at least 20 years after your treatment. If Pap tests have been discontinued, your risk factors (such as having a new sexual partner) need to be reassessed to determine if screening should resume. Some women have medical problems that increase the chance of getting cervical cancer. In these cases, your health care  provider may   recommend more frequent screening and Pap tests. Colorectal Cancer  This type of cancer can be detected and often prevented.  Routine colorectal cancer screening usually begins at 81 years of age and continues through 81 years of age.  Your health care provider may recommend screening at an earlier age if you have risk factors for colon cancer.  Your health care provider may also recommend using home test kits to check for hidden blood in the stool.  A small camera at the end of a tube can be used to examine your colon directly (sigmoidoscopy or colonoscopy). This is done to check for the earliest forms of colorectal cancer.  Routine screening usually begins at age 50.  Direct examination of the colon should be repeated every 5-10 years through 81 years of age. However, you may need to be screened more often if early forms of precancerous polyps or small growths are found. Skin Cancer  Check your skin from head to toe regularly.  Tell your health care provider about any new moles or changes in moles, especially if there is a change in a mole's shape or color.  Also tell your health care provider if you have a mole that is larger than the size of a pencil eraser.  Always use sunscreen. Apply sunscreen liberally and repeatedly throughout the day.  Protect yourself by wearing long sleeves, pants, a wide-brimmed hat, and sunglasses whenever you are outside. Heart disease, diabetes, and high blood pressure  High blood pressure causes heart disease and increases the risk of stroke. High blood pressure is more likely to develop in:  People who have blood pressure in the high end of the normal range (130-139/85-89 mm Hg).  People who are overweight or obese.  People who are African American.  If you are 18-39 years of age, have your blood pressure checked every 3-5 years. If you are 40 years of age or older, have your blood pressure checked every year. You should have your  blood pressure measured twice-once when you are at a hospital or clinic, and once when you are not at a hospital or clinic. Record the average of the two measurements. To check your blood pressure when you are not at a hospital or clinic, you can use:  An automated blood pressure machine at a pharmacy.  A home blood pressure monitor.  If you are between 55 years and 79 years old, ask your health care provider if you should take aspirin to prevent strokes.  Have regular diabetes screenings. This involves taking a blood sample to check your fasting blood sugar level.  If you are at a normal weight and have a low risk for diabetes, have this test once every three years after 81 years of age.  If you are overweight and have a high risk for diabetes, consider being tested at a younger age or more often. Preventing infection Hepatitis B  If you have a higher risk for hepatitis B, you should be screened for this virus. You are considered at high risk for hepatitis B if:  You were born in a country where hepatitis B is common. Ask your health care provider which countries are considered high risk.  Your parents were born in a high-risk country, and you have not been immunized against hepatitis B (hepatitis B vaccine).  You have HIV or AIDS.  You use needles to inject street drugs.  You live with someone who has hepatitis B.  You have had sex with someone who   has hepatitis B.  You get hemodialysis treatment.  You take certain medicines for conditions, including cancer, organ transplantation, and autoimmune conditions. Hepatitis C  Blood testing is recommended for:  Everyone born from [birth year] through 1963.  Anyone with known risk factors for hepatitis C. Sexually transmitted infections (STIs)  You should be screened for sexually transmitted infections (STIs) including gonorrhea and chlamydia if:  You are sexually active and are younger than 81 years of age.  You are older than 81  years of age and your health care provider tells you that you are at risk for this type of infection.  Your sexual activity has changed since you were last screened and you are at an increased risk for chlamydia or gonorrhea. Ask your health care provider if you are at risk.  If you do not have HIV, but are at risk, it may be recommended that you take a prescription medicine daily to prevent HIV infection. This is called pre-exposure prophylaxis (PrEP). You are considered at risk if:  You are sexually active and do not regularly use condoms or know the HIV status of your partner(s).  You take drugs by injection.  You are sexually active with a partner who has HIV. Talk with your health care provider about whether you are at high risk of being infected with HIV. If you choose to begin PrEP, you should first be tested for HIV. You should then be tested every 3 months for as long as you are taking PrEP. Pregnancy  If you are premenopausal and you may become pregnant, ask your health care provider about preconception counseling.  If you may become pregnant, take 400 to 800 micrograms (mcg) of folic acid every day.  If you want to prevent pregnancy, talk to your health care provider about birth control (contraception). Osteoporosis and menopause  Osteoporosis is a disease in which the bones lose minerals and strength with aging. This can result in serious bone fractures. Your risk for osteoporosis can be identified using a bone density scan.  If you are 64 years of age or older, or if you are at risk for osteoporosis and fractures, ask your health care provider if you should be screened.  Ask your health care provider whether you should take a calcium or vitamin D supplement to lower your risk for osteoporosis.  Menopause may have certain physical symptoms and risks.  Hormone replacement therapy may reduce some of these symptoms and risks. Talk to your health care provider about whether  hormone replacement therapy is right for you. Follow these instructions at home:  Schedule regular health, dental, and eye exams.  Stay current with your immunizations.  Do not use any tobacco products including cigarettes, chewing tobacco, or electronic cigarettes.  If you are pregnant, do not drink alcohol.  If you are breastfeeding, limit how much and how often you drink alcohol.  Limit alcohol intake to no more than 1 drink per day for nonpregnant women. One drink equals 12 ounces of beer, 5 ounces of wine, or 1 ounces of hard liquor.  Do not use street drugs.  Do not share needles.  Ask your health care provider for help if you need support or information about quitting drugs.  Tell your health care provider if you often feel depressed.  Tell your health care provider if you have ever been abused or do not feel safe at home. This information is not intended to replace advice given to you by your health care provider. Make  sure you discuss any questions you have with your health care provider. Document Released: 04/11/2011 Document Revised: 03/03/2016 Document Reviewed: 06/30/2015 Elsevier Interactive Patient Education  2017 [employer].   Steps to Quit Smoking Smoking tobacco can be bad for your health. It can also affect almost every organ in your body. Smoking puts you and people around you at risk for many serious long-lasting (chronic) diseases. Quitting smoking is hard, but it is one of the best things that you can do for your health. It is never too late to quit. What are the benefits of quitting smoking? When you quit smoking, you lower your risk for getting serious diseases and conditions. They can include:  Lung cancer or lung disease.  Heart disease.  Stroke.  Heart attack.  Not being able to have children (infertility).  Weak bones (osteoporosis) and broken bones (fractures). If you have coughing, wheezing, and shortness of breath, those symptoms may  get better when you quit. You may also get sick less often. If you are pregnant, quitting smoking can help to lower your chances of having a baby of low birth weight. What can I do to help me quit smoking? Talk with your doctor about what can help you quit smoking. Some things you can do (strategies) include:  Quitting smoking totally, instead of slowly cutting back how much you smoke over a period of time.  Going to in-person counseling. You are more likely to quit if you go to many counseling sessions.  Using resources and support systems, such as:  Online chats with a Social worker.  Phone quitlines.  Printed Furniture conservator/restorer.  Support groups or group counseling.  Text messaging programs.  Mobile phone apps or applications.  Taking medicines. Some of these medicines may have nicotine in them. If you are pregnant or breastfeeding, do not take any medicines to quit smoking unless your doctor says it is okay. Talk with your doctor about counseling or other things that can help you. Talk with your doctor about using more than one strategy at the same time, such as taking medicines while you are also going to in-person counseling. This can help make quitting easier. What things can I do to make it easier to quit? Quitting smoking might feel very hard at first, but there is a lot that you can do to make it easier. Take these steps:  Talk to your family and friends. Ask them to support and encourage you.  Call phone quitlines, reach out to support groups, or work with a Social worker.  Ask people who smoke to not smoke around you.  Avoid places that make you want (trigger) to smoke, such as:  Bars.  Parties.  Smoke-break areas at work.  Spend time with people who do not smoke.  Lower the stress in your life. Stress can make you want to smoke. Try these things to help your stress:  Getting regular exercise.  Deep-breathing exercises.  Yoga.  Meditating.  Doing a body scan.  To do this, close your eyes, focus on one area of your body at a time from head to toe, and notice which parts of your body are tense. Try to relax the muscles in those areas.  Download or buy apps on your mobile phone or tablet that can help you stick to your quit plan. There are many free apps, such as QuitGuide from the [employer] Office manager for Disease Control and Prevention). You can find more support from smokefree.gov and other websites. This information is not  intended to replace advice given to you by your health care provider. Make sure you discuss any questions you have with your health care provider. Document Released: 07/23/2009 Document Revised: 05/24/2016 Document Reviewed: 02/10/2015 Elsevier Interactive Patient Education  2017 [employer].

## 2017-02-02 ENCOUNTER — Telehealth: Payer: Self-pay | Admitting: *Deleted

## 2017-02-02 NOTE — Telephone Encounter (Signed)
Tried to call patient regarding appointment tomorrow with the nurse for blood pressure check.  No answer or voice mail setup.  Nurse will be out of office tomorrow at 3:15 PM.  Patient should reschedule appointment for an earlier time or reschedule for Monday 02/06/17.  Clovis Pu, RN

## 2017-02-02 NOTE — Telephone Encounter (Signed)
Second attempt to reach patient to learn last evening's BP reading and to remind of tomorrow's appt in [facility] for BP recheck. No answer and no VM set up at either number. Kinnie Feil, RN, BSN

## 2017-02-02 NOTE — Telephone Encounter (Signed)
This RN is able to see patient on 02/03/17. If patient returns call please confirm nurse clinic appt at 4 pm and remind patient to take BP meds at least 2 hours before. Kinnie Feil, RN, BSN

## 2017-02-02 NOTE — Telephone Encounter (Signed)
After yesterday's AWV patient was going to take daily dose of isosorbide and altace, wait 2 hours and then go to [facility] to check BP. Attempted to reach patient and then emergency contact to learn that BP reading. There was no answer and no VM set up at either number. Kinnie Feil, RN, BSN

## 2017-02-02 NOTE — Progress Notes (Signed)
I have reviewed this visit and discussed with Alecia Lemming, RN, BSN, and agree with her documentation.   Leland Her, DO PGY-1, [facility] 02/02/2017 11:04 AM

## 2017-02-03 ENCOUNTER — Ambulatory Visit: Payer: PPO | Admitting: *Deleted

## 2017-02-03 VITALS — BP 218/80 | HR 55

## 2017-02-03 DIAGNOSIS — I10 Essential (primary) hypertension: Secondary | ICD-10-CM

## 2017-02-03 DIAGNOSIS — Z0130 Encounter for examination of blood pressure without abnormal findings: Secondary | ICD-10-CM

## 2017-02-03 NOTE — Progress Notes (Signed)
Lauren precepted patient with me. Her systolic BP is high in the 333L and diastolic in the 45G. She had similar readings the last time she came in. Per RN and patient she took all her antihypertensive agents today. She is totally asymptomatic. Currently she is on Imdur 30 mg qd and Altace 2.5 mg qd.  Recommendations: 1. Increase Altace to 5 mg qd and continue current dose of Imdur. 2. Obtain home BP monitoring kit and check BP three times daily recording it in a diary.     She is to bring BP diary with her during next visit. 3. Give BP parameters. Range should be between 110/60 to 150/90. If BP is running too high or too low, please call or go to the ED especially if she becomes symptomatic. 4. Schedule PCP follow up as soon as possible.

## 2017-02-03 NOTE — Progress Notes (Signed)
   Patient here today for BP check. Patient took isosorbide 30 mg and ramipril 2.5 mg at 1400 today.  Last cig also at 1400    BP today is 212/80 left arm manually with adult cuff P 55 SpO2 97% Denies CP, SHOB, Headache, Visual changes. BP recheck 218/80 left arm manually with adult cuff   Discuseed with Preceptor, Dr. Eniola.  Patient instructed to take 2.5 mg ramipril as soon as she arrives home and then to take 2 tabs (5 mg total) daily.  Instructed to purchase home BP kit and check 3 times daily, record BP and heart rate. Goal 140-150/90 If < 110/50 or if feeling dizzy call office or head directly to ED Patient has appt with PCP 02/07/2017 at 1430 All instructions reviewed with daughter, Karen Sanders as well. DUCATTE, LAURENZE L, RN      

## 2017-02-04 ENCOUNTER — Telehealth: Payer: Self-pay | Admitting: Family Medicine

## 2017-02-04 NOTE — Telephone Encounter (Signed)
After hours telephone call  Patient's daughter calls to report hypotension.  Patient was seen at [facility] 4/25 and BP was noted to be 200s systolic. Was advised to take her ramipril.  F/u 4/27 and BP still elevated. Increased ramipril to 2 tabs daily.  BP readings as follows: 184/91 yesterday evening 8pm 156/78 around 10pm yesterday Took meds earlier today than usual 124/63 after taking meds 98/50 330pm 109/58 345pm 102/55 415pm  Patient is asymptomatic: No blurry vision, no HAs, no dizziness, no SOB/CP 5/1 f/u appt with Dr Artist Pais  Advised patient to drink some fluids.  Family to monitor closely.  Caution with standing quickly.  Advised on fall precautions. Ramipril will start to wear off overnight.  If BP consistently in 80-90s systolic or patient is symptomatic, get evaluated at [facility] vs ED.  Decrease back to 1 tab of ramipril tomorrow AM.  Continue to monitor BP TID and bring log to follow-up appt for further titration of medications.  Would also check accuracy of BP cuff at visit.  Erasmo Downer, MD, MPH PGY-3,  [facility] 02/04/2017 4:28 PM

## 2017-02-07 ENCOUNTER — Ambulatory Visit (HOSPITAL_COMMUNITY)
Admission: RE | Admit: 2017-02-07 | Discharge: 2017-02-07 | Disposition: A | Discharge status: Home / Self Care | Payer: PPO | Source: Ambulatory Visit | Attending: Family Medicine | Admitting: Family Medicine

## 2017-02-07 ENCOUNTER — Ambulatory Visit (INDEPENDENT_AMBULATORY_CARE_PROVIDER_SITE_OTHER): Payer: PPO | Admitting: Family Medicine

## 2017-02-07 ENCOUNTER — Encounter: Payer: Self-pay | Admitting: Family Medicine

## 2017-02-07 VITALS — BP 170/67 | HR 55 | Temp 97.0°F | Wt 157.2 lb

## 2017-02-07 DIAGNOSIS — I10 Essential (primary) hypertension: Secondary | ICD-10-CM | POA: Diagnosis not present

## 2017-02-07 DIAGNOSIS — R001 Bradycardia, unspecified: Secondary | ICD-10-CM | POA: Diagnosis not present

## 2017-02-07 DIAGNOSIS — R079 Chest pain, unspecified: Secondary | ICD-10-CM | POA: Insufficient documentation

## 2017-02-07 NOTE — Progress Notes (Signed)
    Subjective:  Cheryl Anderson is a 83 y.o. female who presents to the [facility] today with for BP follow up.   HPI:  Per chart review, seen on 02/03/17 for BP check and was found to be 218/80. Was increased from ramipril 2.5 to 5 mg qd and kept on imdur  24h tablet qd at that time and instructed to monitor her BP at home. Called on 02/04/17 that BP down to 98/50 but patient symptomatic, was instructed to decrease back to ramipril 2.5mg  qd and to follow up in clinic.  Presents today for a BP follow up. Has been compliant on ramipril 2.5mg  qd and imdur  24h tablet qd, taking regularly, no medication side effects noted.  Home monitoring: Ranging 160-170s/70s-90s though earlier today BP was 179/76 and 186/92.   ROS: no TIA's, no chest pain, no dyspnea, noting swelling of ankles that is chronic for her, no orthostatic dizziness or lightheadedness and no palpitations.   Objective:  Physical Exam: BP (!) 170/67 (BP Location: Right Arm)   Pulse (!) 55   Temp 97 F (36.1 C) (Oral)   Wt 157 lb 3.2 oz (71.3 kg)   SpO2 97%   BMI 25.37 kg/m   Gen: NAD, resting comfortably CV: RRR with no murmurs appreciated Pulm: NWOB, CTAB with no crackles, wheezes, or rhonchi GI: Normal bowel sounds present. Soft, Nontender, Nondistended. MSK: 1+ nonpitting edema of ankles only. Skin: warm, dry Neuro: grossly normal, moves all extremities Psych: Normal affect and thought content  EKG: sinus bradycardia, no acute changes  Assessment/Plan:  HYPERTENSION, BENIGN ESSENTIAL Uncontrolled but improved. Of note, patient has had labile BPs with h/o orthostasis. Per chart review, has had ambulatory BP monitoring showing white coat HTN with wide pulse pressure. Her in-office goal BP is higher than 140/90, would aim for <160/90 if possible. Since patient has been asymptomatic offered continued home monitoring with close follow up vs adding a small dose of a 3rd antihypertensive agent. Medication of choice would  have been HCTZ at 12.5mg  since per chart review, patient had orthostasis with  qd in addition to her current regimen. Patient and daughter opted for close monitoring with no medication changes today. - Return in 1 week with home BP readings.     Leland Her, DO PGY-1, [facility] 02/07/2017 2:34 PM

## 2017-02-07 NOTE — Patient Instructions (Signed)
It was good to meet you today  For your blood pressure, - No changes to medications today, keep taking imdur  and ramipril 2.5mg  daily. - Check BP morning and night.Your goal is less than <170/90   Take care and seek immediate care sooner if you develop any concerns.   Dr. Leland Her, DO [facility]

## 2017-02-08 NOTE — Assessment & Plan Note (Signed)
Uncontrolled but improved. Of note, patient has had labile BPs with h/o orthostasis. Per chart review, has had ambulatory BP monitoring showing white coat HTN with wide pulse pressure. Her in-office goal BP is higher than 140/90, would aim for <160/90 if possible. Since patient has been asymptomatic offered continued home monitoring with close follow up vs adding a small dose of a 3rd antihypertensive agent. Medication of choice would have been HCTZ at 12.5mg  since per chart review, patient had orthostasis with  qd in addition to her current regimen. Patient and daughter opted for close monitoring with no medication changes today. - Return in 1 week with home BP readings.

## 2017-02-19 NOTE — Progress Notes (Deleted)
    Subjective:  Sanjuana LettersMildred H Woolston is a 83 y.o. female [facility]  HPI:   ***HIST  Objective:  Physical Exam: There were no vitals taken for this visit.  Gen: ***NAD, resting comfortably CV: RRR with no murmurs appreciated Pulm: NWOB, CTAB with no crackles, wheezes, or rhonchi GI: Normal bowel sounds present. Soft, Nontender, Nondistended. MSK: no edema, cyanosis, or clubbing noted Skin: warm, dry Neuro: grossly normal, moves all extremities Psych: Normal affect and thought content  No results found for this or any previous visit (from the past 72 hour(s)).   Assessment/Plan:  No problem-specific Assessment & Plan notes found for this encounter.   Leland HerElsia J Enas Winchel, DO PGY-***, [facility] 02/19/2017 10:06 PM

## 2017-02-20 ENCOUNTER — Ambulatory Visit: Payer: PPO | Admitting: Family Medicine

## 2017-03-02 ENCOUNTER — Other Ambulatory Visit: Payer: Self-pay | Admitting: Family Medicine

## 2017-03-02 DIAGNOSIS — I10 Essential (primary) hypertension: Secondary | ICD-10-CM

## 2017-03-10 DIAGNOSIS — H40213 Acute angle-closure glaucoma, bilateral: Secondary | ICD-10-CM | POA: Diagnosis not present

## 2017-03-14 ENCOUNTER — Other Ambulatory Visit: Payer: Self-pay | Admitting: *Deleted

## 2017-03-14 DIAGNOSIS — Z9109 Other allergy status, other than to drugs and biological substances: Secondary | ICD-10-CM

## 2017-03-14 NOTE — Telephone Encounter (Signed)
Refill request for 90 day supply.  Sanjiv Castorena L, RN  

## 2017-03-15 MED ORDER — LORATADINE 10 MG PO TABS: 10.0000 mg | ORAL_TABLET | Freq: Every day | ORAL | 1 refills | Status: DC

## 2017-04-09 ENCOUNTER — Other Ambulatory Visit: Payer: Self-pay | Admitting: Family Medicine

## 2017-04-09 DIAGNOSIS — E785 Hyperlipidemia, unspecified: Secondary | ICD-10-CM

## 2017-05-24 ENCOUNTER — Encounter: Payer: Self-pay | Admitting: Family Medicine

## 2017-05-24 ENCOUNTER — Ambulatory Visit (INDEPENDENT_AMBULATORY_CARE_PROVIDER_SITE_OTHER): Payer: PPO | Admitting: Family Medicine

## 2017-05-24 VITALS — BP 186/90 | HR 57 | Temp 98.0°F | Ht 66.0 in | Wt 158.6 lb

## 2017-05-24 DIAGNOSIS — R600 Localized edema: Secondary | ICD-10-CM

## 2017-05-24 DIAGNOSIS — I10 Essential (primary) hypertension: Secondary | ICD-10-CM

## 2017-05-24 MED ORDER — RAMIPRIL 5 MG PO CAPS: 5.0000 mg | ORAL_CAPSULE | Freq: Every day | ORAL | 3 refills | Status: DC

## 2017-05-24 MED ORDER — FUROSEMIDE 20 MG PO TABS: 20.0000 mg | ORAL_TABLET | Freq: Every day | ORAL | 0 refills | Status: DC

## 2017-05-24 MED ORDER — FUROSEMIDE 20 MG PO TABS: 20.0000 mg | ORAL_TABLET | Freq: Every day | ORAL | 3 refills | Status: DC

## 2017-05-24 NOTE — Patient Instructions (Addendum)
It was great to meet you today! Thank you for letting me participate in your care!  Today, we discussed your swelling in both feet. It is likely due to your veins in your legs not being able to pump blood back to your upper body as good as they used to. Your recently high blood pressure may also be a contributing factor. I have added a medication to help with the swelling called Lasix. Please take it as prescribed.  We also discussed your high blood pressure. We will increase your Ramipril to 5mg  due to your blood pressure being high. Please begin checking your blood pressure at home every morning.  Please let us know if you the symptoms do not resolve. Thank you for visiting our office today!  Be well, Jules Schickim Jaeson Molstad, DO PGY-1, [facility]

## 2017-05-24 NOTE — Assessment & Plan Note (Signed)
Venous insufficiency vs CHF vs lymphatic obstruction.  Likely due to venous insufficiency. Gave patient short two week course of Lasix 20mg  to get off extra fluid. Will follow up in 2 weeks.

## 2017-05-24 NOTE — Progress Notes (Signed)
Subjective: Chief Complaint  Patient presents with  . Leg Swelling     HPI: Cheryl Anderson is a 83 y.o. presenting to clinic today to discuss the following:  1 Bilateral Feet swelling 2 High blood pressure  Cheryl Anderson has a history of HTN, GERD, and lower extremity edema. Today she states her feet have been swollen for the past 2-3 weeks. She denies any pain or any other symptoms. She has not been immobile for long periods of time and has not taken any recent plane or long car trips.  Her BP today is not well controlled and was 188/86 and 186/90 on recheck.  She denies fever, chills, chest pain, SOB, no recent sickness, no vision changes, no headaches, no fatigue, or dizziness.  Health Maintenance: none     ROS noted in HPI.   Past Medical, Surgical, Social, and Family History Reviewed & Updated per EMR.   Pertinent Historical Findings include:   History  Smoking Status  . Current Every Day Smoker  . Packs/day: 1.00  . Years: 69.00  . Types: Cigarettes  Smokeless Tobacco  . Never Used      Objective: BP (!) 186/90   Pulse (!) 57   Temp 98 F (36.7 C) (Oral)   Ht 5\' 6"  (1.676 m)   Wt 158 lb 9.6 oz (71.9 kg)   SpO2 98%   BMI 25.60 kg/m  Vitals and nursing notes reviewed  Physical Exam  Gen: Alert and Oriented x 3, NAD HEENT: Normocephalic, atraumatic, PERRLA, EOMI, non-erythematous turbinates, normal pharyngeal mucosa Resp: CTAB, no wheezing, rales, or rhonchi, comfortable work of breathing CV: RRR, no murmurs, normal S1, S2 split, +1 pulses dorsalis pedis bilaterally Abd: non-distended, non-tender, soft, +bs in all four quadrants, no hepatosplenomegaly MSK: FROM in all four extremities, no calf tenderness bilaterally Ext: no clubbing, cyanosis, +1 pedal edema Skin: warm, dry, intact, no rashes  No results found for this or any previous visit (from the past 72 hour(s)).  Assessment/Plan:  HYPERTENSION, BENIGN ESSENTIAL Patient BP today was  188/86 and on recheck was 186/90. Increased Ramipril to 5mg  for better BP control.  Bilateral lower extremity edema Venous insufficiency vs CHF vs lymphatic obstruction.  Likely due to venous insufficiency. Gave patient short two week course of Lasix 20mg  to get off extra fluid. Will follow up in 2 weeks.    Please see problem based Assessment and Plan PATIENT EDUCATION PROVIDED: See AVS    Diagnosis and plan along with any newly prescribed medication(s) were discussed in detail with this patient today. The patient verbalized understanding and agreed with the plan. Patient advised if symptoms worsen return to clinic or ER.   Health Maintainance:   No orders of the defined types were placed in this encounter.   Meds ordered this encounter  Medications  . ramipril (ALTACE) 5 MG capsule    Sig: Take 1 capsule (5 mg total) by mouth daily.    Dispense:  90 capsule    Refill:  3  . DISCONTD: furosemide (LASIX) 20 MG tablet    Sig: Take 1 tablet (20 mg total) by mouth daily.    Dispense:  30 tablet    Refill:  3  . furosemide (LASIX) 20 MG tablet    Sig: Take 1 tablet (20 mg total) by mouth daily.    Dispense:  14 tablet    Refill:  0     Jules Schickim Quita Mcgrory, DO 05/24/2017, 3:46 PM PGY-1, [facility]

## 2017-05-24 NOTE — Assessment & Plan Note (Signed)
Patient BP today was 188/86 and on recheck was 186/90. Increased Ramipril to 5mg  for better BP control.

## 2017-06-07 ENCOUNTER — Ambulatory Visit: Payer: PPO | Admitting: Family Medicine

## 2017-06-19 ENCOUNTER — Other Ambulatory Visit: Payer: Self-pay | Admitting: Family Medicine

## 2017-06-19 DIAGNOSIS — I10 Essential (primary) hypertension: Secondary | ICD-10-CM

## 2017-07-19 DIAGNOSIS — Z961 Presence of intraocular lens: Secondary | ICD-10-CM | POA: Diagnosis not present

## 2017-07-19 DIAGNOSIS — H40213 Acute angle-closure glaucoma, bilateral: Secondary | ICD-10-CM | POA: Diagnosis not present

## 2017-10-16 ENCOUNTER — Other Ambulatory Visit: Payer: Self-pay | Admitting: *Deleted

## 2017-10-16 DIAGNOSIS — I10 Essential (primary) hypertension: Secondary | ICD-10-CM

## 2017-10-17 MED ORDER — ISOSORBIDE MONONITRATE ER 30 MG PO TB24: ORAL_TABLET | ORAL | 2 refills | Status: DC

## 2017-10-24 ENCOUNTER — Encounter: Payer: Self-pay | Admitting: Family Medicine

## 2017-10-24 ENCOUNTER — Other Ambulatory Visit: Payer: Self-pay

## 2017-10-24 ENCOUNTER — Ambulatory Visit: Payer: PPO | Admitting: Family Medicine

## 2017-10-24 VITALS — BP 112/60 | HR 84 | Temp 97.5°F | Ht 66.0 in | Wt 149.0 lb

## 2017-10-24 DIAGNOSIS — E785 Hyperlipidemia, unspecified: Secondary | ICD-10-CM

## 2017-10-24 DIAGNOSIS — M25471 Effusion, right ankle: Secondary | ICD-10-CM | POA: Diagnosis not present

## 2017-10-24 DIAGNOSIS — I10 Essential (primary) hypertension: Secondary | ICD-10-CM | POA: Diagnosis not present

## 2017-10-24 DIAGNOSIS — M25472 Effusion, left ankle: Secondary | ICD-10-CM

## 2017-10-24 NOTE — Patient Instructions (Signed)
Edema Edema is when you have too much fluid in your body or under your skin. Edema may make your legs, feet, and ankles swell up. Swelling is also common in looser tissues, like around your eyes. This is a common condition. It gets more common as you get older. There are many possible causes of edema. Eating too much salt (sodium) and being on your feet or sitting for a long time can cause edema in your legs, feet, and ankles. Hot weather may make edema worse. Edema is usually painless. Your skin may look swollen or shiny. Follow these instructions at home:  Keep the swollen body part raised (elevated) above the level of your heart when you are sitting or lying down.  Do not sit still or stand for a long time.  Do not wear tight clothes. Do not wear garters on your upper legs.  Exercise your legs. This can help the swelling go down.  Wear elastic bandages or support stockings as told by your doctor.  Eat a low-salt (low-sodium) diet to reduce fluid as told by your doctor.  Depending on the cause of your swelling, you may need to limit how much fluid you drink (fluid restriction).  Take over-the-counter and prescription medicines only as told by your doctor. Contact a doctor if:  Treatment is not working.  You have heart, liver, or kidney disease and have symptoms of edema.  You have sudden and unexplained weight gain. Get help right away if:  You have shortness of breath or chest pain.  You cannot breathe when you lie down.  You have pain, redness, or warmth in the swollen areas.  You have heart, liver, or kidney disease and get edema all of a sudden.  You have a fever and your symptoms get worse all of a sudden. Summary  Edema is when you have too much fluid in your body or under your skin.  Edema may make your legs, feet, and ankles swell up. Swelling is also common in looser tissues, like around your eyes.  Raise (elevate) the swollen body part above the level of your  heart when you are sitting or lying down.  Follow your doctor's instructions about diet and how much fluid you can drink (fluid restriction). This information is not intended to replace advice given to you by your health care provider. Make sure you discuss any questions you have with your health care provider. Document Released: 03/14/2008 Document Revised: 10/14/2016 Document Reviewed: 10/14/2016 Elsevier Interactive Patient Education  2017 Elsevier Inc.  

## 2017-10-24 NOTE — Progress Notes (Signed)
Subjective:     Patient ID: Cheryl Anderson, female   DOB: 03/01/1929, 84 y.o.   MRN: [id number]  HPI Feet swelling: More than 6 months ago, swells all the time and feels it's about the same.  She denies any pain. Does not add a lot of salt to her diet. Swelling goes down when she goes to bed and in the morning, it looks fine and then when she works around for a bit it swollen up. Denies chest pain or SOB. Endorsed swelling in her hands in the morning. No change in her urine, at times because of inadequate fluid intake it appears dark. WUJ:[id number]:Compliant with meds, here for follow-up. NFA:[id number]:Claimed compliant with meds, she stated she takes it at night sometimes.  Current Outpatient Medications on File Prior to Visit  Medication Sig Dispense Refill  . alendronate (FOSAMAX) 70 MG tablet Take 1 tablet (70 mg total) by mouth once a week. Take with a full glass of water on an empty stomach. 4 tablet 2  . aspirin EC 81 MG tablet Take 1 tablet by mouth daily.    Marland Kitchen. atorvastatin (LIPITOR) 80 MG tablet TAKE 1 TABLET (80 MG TOTAL) BY MOUTH DAILY. 90 tablet 1  . gabapentin (NEURONTIN) 300 MG capsule Take 1 capsule (300 mg total) by mouth at bedtime. For RLS 30 capsule 3  . isosorbide mononitrate (IMDUR) 30 MG 24 hr tablet EVERY DAY 30 tablet 2  . ramipril (ALTACE) 5 MG capsule Take 1 capsule (5 mg total) by mouth daily. 90 capsule 3  . Calcium Carbonate-Vitamin D (CALCIUM 600+D) 600-200 MG-UNIT TABS Take 1 tablet by mouth daily.    . Calcium Carbonate-Vitamin D 600-400 MG-UNIT chew tablet Chew 1 tablet by mouth 2 (two) times daily. 120 tablet 12  . cyclobenzaprine (FLEXERIL) 5 MG tablet Take 1 tablet by mouth daily as needed.    . furosemide (LASIX) 20 MG tablet Take 1 tablet (20 mg total) by mouth daily. 14 tablet 0  . loratadine (CLARITIN) 10 MG tablet Take 1 tablet (10 mg total) by mouth daily. 90 tablet 1  . naproxen (NAPROSYN) 500 MG tablet TAKE 1 TABLET (500 MG TOTAL) BY MOUTH 2 (TWO) TIMES DAILY WITH A  MEAL. (Patient not taking: Reported on 10/24/2017) 30 tablet 0  . nitroGLYCERIN (NITROSTAT) 0.4 MG SL tablet Take every 5 minutes for 3 doses. If pain not resolved, call 911 or go to ER. (Patient not taking: Reported on 10/24/2017) 30 tablet 2   No current facility-administered medications on file prior to visit.    Past Medical History:  Diagnosis Date  . CAD (coronary artery disease)   . Digestive problems   . Heart disorder   . Hyperlipidemia   . Hypertension   . Osteoporosis    Vitals:   10/24/17 1011  BP: 112/60  Pulse: 84  Temp: (!) 97.5 F (36.4 C)  TempSrc: Oral  Weight: 149 lb (67.6 kg)  Height: 5\' 6"  (1.676 m)     Review of Systems  Respiratory: Negative.   Cardiovascular: Positive for leg swelling. Negative for chest pain and palpitations.  Genitourinary: Negative.   Neurological: Negative.   All other systems reviewed and are negative.      Objective:   Physical Exam  Constitutional: She is oriented to person, place, and time. She appears well-developed. No distress.  Cardiovascular: Normal rate, regular rhythm, normal heart sounds and intact distal pulses.  No murmur heard. Pulmonary/Chest: Effort normal and breath sounds normal. No respiratory distress. She  has no wheezes.  Abdominal: Soft. Bowel sounds are normal. She exhibits no distension and no mass. There is no tenderness. There is no rebound.  Musculoskeletal: Normal range of motion.  B/L ankle edema ++  Neurological: She is alert and oriented to person, place, and time.  Nursing note and vitals reviewed.      Assessment:     Pedal edema Essential HTN HLD    Plan:     1. Off Lasix. Will not restart at this time given low normal BP.     I recommended conservative measures for her edema( reduce salt intake, wear compression stocking and elevated LL when sitting).    R/O CHF vs Renal insufficiency as a cause of her edema.    ECHO ordered and Cmet checked today.    Appointment date and time  given.    Return to the ED if she develops SOB or worsening of her swelling.  2. BP looks good on Imdur 30 mg qd and Ramipril 5 mg qd.     No dose adjustment needed.  3. HLD: FLP checked.     Continue current dose of Lipitor. See PCP soon for further management and recommendations.

## 2017-10-25 ENCOUNTER — Other Ambulatory Visit: Payer: Self-pay | Admitting: Family Medicine

## 2017-10-31 ENCOUNTER — Ambulatory Visit (HOSPITAL_COMMUNITY): Admission: RE | Admit: 2017-10-31 | Payer: PPO | Source: Ambulatory Visit

## 2017-11-03 ENCOUNTER — Ambulatory Visit (HOSPITAL_COMMUNITY): Admission: RE | Admit: 2017-11-03 | Payer: PPO | Source: Ambulatory Visit

## 2017-11-03 ENCOUNTER — Telehealth: Payer: Self-pay | Admitting: Family Medicine

## 2017-11-03 ENCOUNTER — Ambulatory Visit (HOSPITAL_COMMUNITY)
Admission: RE | Admit: 2017-11-03 | Discharge: 2017-11-03 | Disposition: A | Discharge status: Home / Self Care | Payer: PPO | Source: Ambulatory Visit | Attending: Family Medicine | Admitting: Family Medicine

## 2017-11-03 DIAGNOSIS — I25119 Atherosclerotic heart disease of native coronary artery with unspecified angina pectoris: Secondary | ICD-10-CM | POA: Insufficient documentation

## 2017-11-03 DIAGNOSIS — E785 Hyperlipidemia, unspecified: Secondary | ICD-10-CM | POA: Insufficient documentation

## 2017-11-03 DIAGNOSIS — I071 Rheumatic tricuspid insufficiency: Secondary | ICD-10-CM | POA: Diagnosis not present

## 2017-11-03 DIAGNOSIS — M7989 Other specified soft tissue disorders: Secondary | ICD-10-CM | POA: Diagnosis not present

## 2017-11-03 DIAGNOSIS — I10 Essential (primary) hypertension: Secondary | ICD-10-CM | POA: Insufficient documentation

## 2017-11-03 DIAGNOSIS — M25471 Effusion, right ankle: Secondary | ICD-10-CM

## 2017-11-03 DIAGNOSIS — M25472 Effusion, left ankle: Secondary | ICD-10-CM

## 2017-11-03 NOTE — Progress Notes (Signed)
  Echocardiogram 2D Echocardiogram has been performed.  Janalyn HarderWest, Keahi Mccarney R 11/03/2017, 3:15 PM

## 2017-11-03 NOTE — Telephone Encounter (Signed)
I spoke with patient and discuss her ECHO result. I also realize that her blood test was not done during last visit. She can't tell why it was not done either.  ECHO result showed G2DD with normal EF and mild to moderated Pulm HTN.  New Diastolic CHF might be contributing to her pedal edema.  Management will be with Lasix which was not started given her low BP. I recommended f/u with PCP to 1. Recheck labs. 2. Recheck BP and consider Lasix if appropriate. 3. Consider Cards referral.  Appointment made for 11/07/17 at 2:10 pm.  She verbalized understanding and read by information provided. She was advised ED visit if swelling worsens or she develops SOB.

## 2017-11-07 ENCOUNTER — Other Ambulatory Visit: Payer: Self-pay

## 2017-11-07 ENCOUNTER — Encounter: Payer: Self-pay | Admitting: Family Medicine

## 2017-11-07 ENCOUNTER — Ambulatory Visit (INDEPENDENT_AMBULATORY_CARE_PROVIDER_SITE_OTHER): Payer: PPO | Admitting: Family Medicine

## 2017-11-07 VITALS — BP 146/64 | HR 58 | Temp 97.9°F | Wt 150.0 lb

## 2017-11-07 DIAGNOSIS — Z23 Encounter for immunization: Secondary | ICD-10-CM | POA: Diagnosis not present

## 2017-11-07 DIAGNOSIS — E785 Hyperlipidemia, unspecified: Secondary | ICD-10-CM

## 2017-11-07 DIAGNOSIS — I10 Essential (primary) hypertension: Secondary | ICD-10-CM | POA: Diagnosis not present

## 2017-11-07 DIAGNOSIS — M810 Age-related osteoporosis without current pathological fracture: Secondary | ICD-10-CM | POA: Diagnosis not present

## 2017-11-07 DIAGNOSIS — I5030 Unspecified diastolic (congestive) heart failure: Secondary | ICD-10-CM | POA: Insufficient documentation

## 2017-11-07 MED ORDER — FUROSEMIDE 20 MG PO TABS: 20.0000 mg | ORAL_TABLET | Freq: Every day | ORAL | 1 refills | Status: DC

## 2017-11-07 MED ORDER — RAMIPRIL 5 MG PO CAPS: 5.0000 mg | ORAL_CAPSULE | Freq: Every day | ORAL | 3 refills | Status: DC

## 2017-11-07 MED ORDER — ALENDRONATE SODIUM 70 MG PO TABS: 70.0000 mg | ORAL_TABLET | ORAL | 2 refills | Status: DC

## 2017-11-07 MED ORDER — CALCIUM CARBONATE-VITAMIN D 600-400 MG-UNIT PO CHEW: 1.0000 | CHEWABLE_TABLET | Freq: Two times a day (BID) | ORAL | 12 refills | Status: DC

## 2017-11-07 NOTE — Patient Instructions (Signed)
Blood pressure medications are:  - ramipril 5mg  at bedtime - imdur 30mg  at bedtime - lasix 20mg  in the morning  Soft BPs are <110/40 or anytime she is symptomatic (lightheadedness, dizziness, feeling like going to faint, sweats.    Heart Failure Heart failure means your heart has trouble pumping blood. This makes it hard for your body to work well. Heart failure is usually a long-term (chronic) condition. You must take good care of yourself and follow your doctor's treatment plan. Follow these instructions at home:  Take your heart medicine as told by your doctor. ? Do not stop taking medicine unless your doctor tells you to. ? Do not skip any dose of medicine. ? Refill your medicines before they run out. ? Take other medicines only as told by your doctor or pharmacist.  Stay active if told by your doctor. The elderly and people with severe heart failure should talk with a doctor about physical activity.  Eat heart-healthy foods. Choose foods that are without trans fat and are low in saturated fat, cholesterol, and salt (sodium). This includes fresh or frozen fruits and vegetables, fish, lean meats, fat-free or low-fat dairy foods, whole grains, and high-fiber foods. Lentils and dried peas and beans (legumes) are also good choices.  Limit salt if told by your doctor.  Cook in a healthy way. Roast, grill, broil, bake, poach, steam, or stir-fry foods.  Limit fluids as told by your doctor.  Weigh yourself every morning. Do this after you pee (urinate) and before you eat breakfast. Write down your weight to give to your doctor.  Take your blood pressure and write it down if your doctor tells you to.  Ask your doctor how to check your pulse. Check your pulse as told.  Lose weight if told by your doctor.  Stop smoking or chewing tobacco. Do not use gum or patches that help you quit without your doctor's approval.  Schedule and go to doctor visits as told.  Nonpregnant women should  have no more than 1 drink a day. Men should have no more than 2 drinks a day. Talk to your doctor about drinking alcohol.  Stop illegal drug use.  Stay current with shots (immunizations).  Manage your health conditions as told by your doctor.  Learn to manage your stress.  Rest when you are tired.  If it is really hot outside: ? Avoid intense activities. ? Use air conditioning or fans, or get in a cooler place. ? Avoid caffeine and alcohol. ? Wear loose-fitting, lightweight, and light-colored clothing.  If it is really cold outside: ? Avoid intense activities. ? Layer your clothing. ? Wear mittens or gloves, a hat, and a scarf when going outside. ? Avoid alcohol.  Learn about heart failure and get support as needed.  Get help to maintain or improve your quality of life and your ability to care for yourself as needed. Contact a doctor if:  You gain weight quickly.  You are more short of breath than usual.  You cannot do your normal activities.  You tire easily.  You cough more than normal, especially with activity.  You have any or more puffiness (swelling) in areas such as your hands, feet, ankles, or belly (abdomen).  You cannot sleep because it is hard to breathe.  You feel like your heart is beating fast (palpitations).  You get dizzy or light-headed when you stand up. Get help right away if:  You have trouble breathing.  There is a change in  mental status, such as becoming less alert or not being able to focus.  You have chest pain or discomfort.  You faint. This information is not intended to replace advice given to you by your health care provider. Make sure you discuss any questions you have with your health care provider. Document Released: 07/05/2008 Document Revised: 03/03/2016 Document Reviewed: 11/12/2012 Elsevier Interactive Patient Education  2017 [employer]

## 2017-11-07 NOTE — Assessment & Plan Note (Addendum)
New diagnosis of diastolic heart failure per echo results on 11/03/17.  Patient has self restarted Lasix with improvement in her lower extremity edema and blood pressure stable without hypotension at home.  Continue Lasix 20 mg daily, Imdur 30 mg daily, ramipril 5 mg daily.  Given patient has had labile blood pressures with symptomatic hypotension recently in the past, counseled patient and her daughter at length regarding the symptoms today.  Discussed that we would decrease her blood pressure medications if this becomes an issue and that if her blood pressure becomes very labile again that we would consider cards referral.  Check CMP today.  Follow-up in 1 month.

## 2017-11-07 NOTE — Progress Notes (Signed)
    Subjective:  Cheryl Anderson is a 84 y.o. female who [facility] with a chief complaint of CHF follow up.   HPI:  Patient has had multiple recent visits for lower extremity edema and was found to have diastolic heart failure on echocardiogram 11/03/17 with EF 65-70%, mild LVH, grade 2 diastolic dysfunction, mild to moderate pulmonary hypertension. Patient states that she has continued to have swelling in her ankles at home.  She restarted Lasix 20 mg daily 3 days ago by herself because she wanted to treat this.  She is also taking her high blood pressure medications which include ramipril 5 mg daily Imdur 30 mg daily. She denies any orthopnea, shortness of breath, palpitations, lightheadedness, dizziness, syncope.  She does say that she has some occasional left sided chest pain just adjacent to her sternum that is reproducible on palpation and with movement that is not present today. She follows a low-salt diet. Checks her blood pressures at home which have been ranging from 130-180/50s-60s.  ROS: Per HPI  Objective:  Physical Exam: BP (!) 146/64   Pulse (!) 58   Temp 97.9 F (36.6 C) (Oral)   Wt 150 lb (68 kg)   SpO2 99%   BMI 24.21 kg/m   Gen: NAD, resting comfortably CV: RRR with no murmurs appreciated Pulm: NWOB, CTAB with no crackles, wheezes, or rhonchi GI: Normal bowel sounds present. Soft, Nontender, Nondistended. MSK: 1+ pitting edema to ankles and trace edema to midshin bilaterally. Skin: warm, dry Neuro: grossly normal, moves all extremities Psych: Normal affect and thought content   Assessment/Plan:  (HFpEF) heart failure with preserved ejection fraction (HCC) New diagnosis of diastolic heart failure per echo results on 11/03/17.  Patient has self restarted Lasix with improvement in her lower extremity edema and blood pressure stable without hypotension at home.  Continue Lasix 20 mg daily, Imdur 30 mg daily, ramipril 5 mg daily.  Given patient has  had labile blood pressures with symptomatic hypotension recently in the past, counseled patient and her daughter at length regarding the symptoms today.  Discussed that we would decrease her blood pressure medications if this becomes an issue and that if her blood pressure becomes very labile again that we would consider cards referral.  Check CMP today.  Follow-up in 1 month.   Cheryl Cheryl J Cheryl Cahall, DO PGY-2, [facility] 11/07/2017 2:28 PM

## 2017-11-08 ENCOUNTER — Encounter: Payer: Self-pay | Admitting: Family Medicine

## 2017-11-08 LAB — CMP14+EGFR
A/G RATIO: 1.5 (ref 1.2–2.2)
ALT: 9 IU/L (ref 0–32)
AST: 16 IU/L (ref 0–40)
Albumin: 4 g/dL (ref 3.5–4.7)
Alkaline Phosphatase: 69 IU/L (ref 39–117)
BUN/Creatinine Ratio: 10 — ABNORMAL LOW (ref 12–28)
BUN: 11 mg/dL (ref 8–27)
Bilirubin Total: 0.4 mg/dL (ref 0.0–1.2)
CALCIUM: 9.1 mg/dL (ref 8.7–10.3)
CO2: 26 mmol/L (ref 20–29)
CREATININE: 1.06 mg/dL — AB (ref 0.57–1.00)
Chloride: 103 mmol/L (ref 96–106)
GFR, EST AFRICAN AMERICAN: 55 mL/min/{1.73_m2} — AB (ref >59)
GFR, EST NON AFRICAN AMERICAN: 47 mL/min/{1.73_m2} — AB (ref >59)
Globulin, Total: 2.6 g/dL (ref 1.5–4.5)
Glucose: 91 mg/dL (ref 65–99)
POTASSIUM: 4.6 mmol/L (ref 3.5–5.2)
SODIUM: 140 mmol/L (ref 134–144)
TOTAL PROTEIN: 6.6 g/dL (ref 6.0–8.5)

## 2017-11-18 ENCOUNTER — Other Ambulatory Visit: Payer: Self-pay | Admitting: Family Medicine

## 2017-11-18 DIAGNOSIS — E785 Hyperlipidemia, unspecified: Secondary | ICD-10-CM

## 2017-12-04 ENCOUNTER — Encounter: Payer: Self-pay | Admitting: Family Medicine

## 2017-12-04 ENCOUNTER — Other Ambulatory Visit: Payer: Self-pay

## 2017-12-04 ENCOUNTER — Ambulatory Visit (INDEPENDENT_AMBULATORY_CARE_PROVIDER_SITE_OTHER): Payer: PPO | Admitting: Family Medicine

## 2017-12-04 VITALS — BP 170/60 | HR 55 | Temp 97.7°F | Wt 145.0 lb

## 2017-12-04 DIAGNOSIS — I5030 Unspecified diastolic (congestive) heart failure: Secondary | ICD-10-CM | POA: Diagnosis not present

## 2017-12-04 DIAGNOSIS — I10 Essential (primary) hypertension: Secondary | ICD-10-CM | POA: Diagnosis not present

## 2017-12-04 NOTE — Patient Instructions (Signed)
It was good to see you today!  No changes to your blood pressure medications today. Try some compression hose.   Please check-out at the front desk before leaving the clinic. Make an appointment in 1 month.   Sign up for My Chart to have easy access to your labs results, and communication with your primary care physician.  Feel free to call with any questions or concerns at any time, at [phone].   Take care,  Dr. Leland HerElsia J Cythia Bachtel, DO [facility]

## 2017-12-04 NOTE — Assessment & Plan Note (Addendum)
Recent dx of HFpEFon 11/03/17 and patient had self restarted lasix 20mg  qd at last visit on 11/07/17 in addition to her imdur30mg  qd and ramipiril 5mg  qd. LE edema appears improved and weight down to 145lbs from 150lbs last visit. Given patient's labile BPs and mildly symptomatic hypotension without red flags, no changes to medication today. Discussed careful monitoring at home and symptomatic management with compression hose. Follow up in 1 month

## 2017-12-04 NOTE — Assessment & Plan Note (Signed)
In office BP elevated at 170/60 however per home BP monitor records averaging more in 150/70s which is at goal for age. Home BP monitor compared to in office manual BP readings today and appears to be accurate. Joint decision making with patient and her daughter today given patient's mildly symptomatic hypotension. Opted for careful monitoring at home on current medication regimen.

## 2017-12-04 NOTE — Progress Notes (Signed)
    Subjective:  Cheryl Anderson is a 84 y.o. female [facility].  HPI:  Patient has a h/o labile BPs with symptomatic hypotension in the past. Last seen on 11/07/17 after a recent Dx of HFpEF on 11/03/17 by echo. At that time had discussed staying on Lasix 20 mg daily, Imdur 30 mg daily, ramipril 5 mg daily without monitoring of home BPs and LE edema. Since last visit has been checking BP daily ay home and daughter brought home monitor in today. Readings ranging from 102/51 to 171/102, averaging around 150/70s. Patient states she does occasionally get lightheaded. No falls, syncope, SOB. Did have one episodes of chest pain a few days ago that was mild and self resolved and may have been associated with deep breathing. Was not associated with diaphoresis. Did not need her nitroglycerin. No chest pain since. Ankles have continued swelling, better than last visit. Does put her feet up but not all the time.  No PND. No orthopnea.  ROS: Per HPI  Smoking Hx: now down to 4-5 cig a day, plans to continue cutting down but no quit date yet.  Objective:  Physical Exam: BP (!) 170/60   Pulse (!) 55   Temp 97.7 F (36.5 C) (Oral)   Wt 145 lb (65.8 kg)   SpO2 98%   BMI 23.40 kg/m  BP recheck 174/72 Gen: NAD, resting comfortably CV: RRR with no murmurs appreciated. 2+ pitting edema to mid shin bilaterally. Pulm: NWOB, CTAB with no crackles, wheezes, or rhonchi GI: Normal bowel sounds present. Soft, Nontender, Nondistended. MSK: no edema, cyanosis, or clubbing noted Skin: warm, dry Neuro: grossly normal, moves all extremities Psych: Normal affect and thought content    Assessment/Plan:  (HFpEF) heart failure with preserved ejection fraction (HCC) Recent dx of HFpEFon 11/03/17 and patient had self restarted lasix 20mg  qd at last visit on 11/07/17 in addition to her imdur30mg  qd and ramipiril 5mg  qd. LE edema appears improved and weight down to 145lbs from  150lbs last visit. Given patient's labile BPs and mildly symptomatic hypotension without red flags, no changes to medication today. Discussed careful monitoring at home and symptomatic management with compression hose. Follow up in 1 month  HYPERTENSION, BENIGN ESSENTIAL In office BP elevated at 170/60 however per home BP monitor records averaging more in 150/70s which is at goal for age. Home BP monitor compared to in office manual BP readings today and appears to be accurate. Joint decision making with patient and her daughter today given patient's mildly symptomatic hypotension. Opted for careful monitoring at home on current medication regimen.   Cheryl HerElsia J Ulices Maack, DO PGY-2,  Family Medicine 12/04/2017 2:05 PM

## 2017-12-12 ENCOUNTER — Telehealth: Payer: Self-pay

## 2017-12-12 NOTE — Telephone Encounter (Signed)
Pt's daughter called, really wanting to speak with Dr. Artist PaisYoo. She states pt still having high BP readings even with changed medications. She states pt is not experiencing any symptoms, but still concerned BP are not controlled. Would like Dr. Artist PaisYoo to return call at [phone] Shawna OrleansMeredith B Radley Teston, RN

## 2017-12-14 NOTE — Telephone Encounter (Signed)
Called back to speak with the patient's daughter regarding the patient's blood pressures. SHe reports that blood pressures have been elevated on the home cuff 167-174/62-75. She has had no chest pain, vision changes, or headaches associated with high blood pressures. She had one elevated read at 185/74. SHe has been taking lasix during the day and norvasc and ramapril at night.  Per chart review patient has a history of labile pressures and has had symptomatic hypotension in the past. The patient is 84 years old but very active around the house with cleaning and doing the laundry. Discussed with daughter that I am hesitant to make medication adjustments over the phone which may result in hypotension, because this could result in a fall or syncope.  Discussed red flags or reasons to call back including pressures >190 systolic or associated headaches, vision changes or chest pain with higher pressures.   Patient to call through front office and schedule an office visit for further management of blood pressure early next week when Dr. Artist PaisYoo is back in the office.

## 2018-01-04 DIAGNOSIS — H52223 Regular astigmatism, bilateral: Secondary | ICD-10-CM | POA: Diagnosis not present

## 2018-01-04 DIAGNOSIS — Z961 Presence of intraocular lens: Secondary | ICD-10-CM | POA: Diagnosis not present

## 2018-01-04 DIAGNOSIS — H5203 Hypermetropia, bilateral: Secondary | ICD-10-CM | POA: Diagnosis not present

## 2018-01-04 DIAGNOSIS — H524 Presbyopia: Secondary | ICD-10-CM | POA: Diagnosis not present

## 2018-01-17 ENCOUNTER — Ambulatory Visit (HOSPITAL_COMMUNITY)
Admission: EM | Admit: 2018-01-17 | Discharge: 2018-01-17 | Disposition: A | Discharge status: Home / Self Care | Payer: PPO | Attending: Urgent Care | Admitting: Urgent Care

## 2018-01-17 ENCOUNTER — Encounter (HOSPITAL_COMMUNITY): Payer: Self-pay | Admitting: Emergency Medicine

## 2018-01-17 DIAGNOSIS — I10 Essential (primary) hypertension: Secondary | ICD-10-CM

## 2018-01-17 DIAGNOSIS — M79671 Pain in right foot: Secondary | ICD-10-CM

## 2018-01-17 DIAGNOSIS — I5030 Unspecified diastolic (congestive) heart failure: Secondary | ICD-10-CM

## 2018-01-17 DIAGNOSIS — R58 Hemorrhage, not elsewhere classified: Secondary | ICD-10-CM

## 2018-01-17 DIAGNOSIS — R0789 Other chest pain: Secondary | ICD-10-CM

## 2018-01-17 DIAGNOSIS — R030 Elevated blood-pressure reading, without diagnosis of hypertension: Secondary | ICD-10-CM

## 2018-01-17 MED ORDER — AMLODIPINE BESYLATE 5 MG PO TABS: 5.0000 mg | ORAL_TABLET | Freq: Every day | ORAL | 0 refills | Status: DC

## 2018-01-17 NOTE — ED Triage Notes (Signed)
Pt c/o HTN today, normally runs 150, states she feels a little off balance today. Checked her BP, 218/111 at home. Pt also c/o toe pain on her R foot, states "its discolored" denies injury. Pt ambulatory with steady.

## 2018-01-17 NOTE — ED Provider Notes (Signed)
MRN: [id number] DOB: 11/27/1928  Subjective:   Cheryl Anderson is a 84 y.o. female presenting for elevated blood pressure readings at home today. Had a reading of 218/111. She has not taken her ramipril, takes it at night. She has taken her Lasix. She also had unsteadiness that is now resolved, 1 episode of ~5 minute mid-sternal to left sided mild chest pain. Denies diaphoresis, headaches, heart racing, palpitations, shortness of breath, nausea, vomiting, belly pain.  Patient has also had intermittent right toe pain and states that it looks discolored.  She cannot recall any trauma in particular.  She has not tried any medications for relief.  Denies history of gout.  Per notes from her PCP she has a history of recent heart failure but the patient states that she does not have a cardiologist.  No current facility-administered medications for this encounter.   Current Outpatient Medications:  .  atorvastatin (LIPITOR) 80 MG tablet, TAKE 1 TABLET BY MOUTH EVERY DAY, Disp: 90 tablet, Rfl: 1 .  furosemide (LASIX) 20 MG tablet, Take 1 tablet (20 mg total) by mouth daily., Disp: 30 tablet, Rfl: 1 .  isosorbide mononitrate (IMDUR) 30 MG 24 hr tablet, EVERY DAY, Disp: 30 tablet, Rfl: 2 .  ramipril (ALTACE) 5 MG capsule, Take 1 capsule (5 mg total) by mouth daily., Disp: 90 capsule, Rfl: 3 .  alendronate (FOSAMAX) 70 MG tablet, Take 1 tablet (70 mg total) by mouth once a week. Take with a full glass of water on an empty stomach., Disp: 4 tablet, Rfl: 2 .  aspirin EC 81 MG tablet, Take 1 tablet by mouth daily., Disp: , Rfl:  .  Calcium Carbonate-Vitamin D 600-400 MG-UNIT chew tablet, Chew 1 tablet by mouth 2 (two) times daily., Disp: 120 tablet, Rfl: 12 .  gabapentin (NEURONTIN) 300 MG capsule, Take 1 capsule (300 mg total) by mouth at bedtime. For RLS, Disp: 30 capsule, Rfl: 3 .  loratadine (CLARITIN) 10 MG tablet, Take 1 tablet (10 mg total) by mouth daily., Disp: 90 tablet, Rfl: 1 .  nitroGLYCERIN  (NITROSTAT) 0.4 MG SL tablet, Take every 5 minutes for 3 doses. If pain not resolved, call 911 or go to ER. (Patient not taking: Reported on 10/24/2017), Disp: 30 tablet, Rfl: 2   Allergies  Allergen Reactions  . Codeine Nausea Only    Past Medical History:  Diagnosis Date  . CAD (coronary artery disease)   . Digestive problems   . Heart disorder   . Hyperlipidemia   . Hypertension   . Osteoporosis      History reviewed. No pertinent surgical history.  Objective:   Vitals: BP (!) 216/77   Pulse 70   Temp 98 F (36.7 C)   Resp 18   SpO2 100%    Weight is 149.6lbs today.  BP Readings from Last 3 Encounters:  01/17/18 (!) 216/77  12/04/17 (!) 170/60  11/07/17 (!) 146/64   Wt Readings from Last 3 Encounters:  12/04/17 145 lb (65.8 kg)  11/07/17 150 lb (68 kg)  10/24/17 149 lb (67.6 kg)    Physical Exam  Constitutional: She is oriented to person, place, and time. She appears well-developed and well-nourished.  HENT:  Mouth/Throat: Oropharynx is clear and moist.  Eyes: Pupils are equal, round, and reactive to light. EOM are normal. No scleral icterus.  Cardiovascular: Normal rate, regular rhythm and intact distal pulses. Exam reveals no gallop and no friction rub.  No murmur heard. Pulmonary/Chest: No respiratory distress. She has no  wheezes. She has no rales.  Abdominal: Soft. Bowel sounds are normal. She exhibits no distension and no mass. There is no tenderness. There is no rebound and no guarding.  Musculoskeletal: She exhibits edema (+1 edema at ankles bilaterally).  Patient endorses mild tenderness at first right MTP.  There is ecchymosis over the area as well.  No swelling or erythema, warmth.  Neurological: She is alert and oriented to person, place, and time. She displays normal reflexes. No cranial nerve deficit. Coordination normal.  Negative Romberg and pronator drift.  Speech is intact.  Skin: Skin is warm and dry. Capillary refill takes less than 2  seconds.  Psychiatric: She has a normal mood and affect.   ED ECG REPORT   Date: 01/17/2018  Rate: 68bpm  Rhythm: normal sinus rhythm  QRS Axis: normal  Intervals: normal  ST/T Wave abnormalities: Nonspecific T wave inversion at V2  Conduction Disutrbances:none  Narrative Interpretation: Sinus rhythm at 68 bpm with nonspecific T wave inversion at V2 but no acute changes.  EKG from today is very comparable to that of Feb 07, 2017.  Old EKG Reviewed: changes noted  I have personally reviewed the EKG tracing and agree with the computerized printout as noted.   Assessment and Plan :   Essential hypertension  Elevated blood pressure reading  Heart failure with preserved ejection fraction, unspecified HF chronicity (HCC)  Atypical chest pain  Right foot pain  Ecchymosis on examination  Patient's physical exam findings and EKG very reassuring today.  She has not taken ramipril today, and I counseled that she may need another blood pressure medicine in addition to that.  She agreed to start amlodipine at 5 mg.  I counseled on signs of hypertensive urgency, heart failure and need for ER evaluation if this develops.  Patient will maintain all other medications and will check back with her PCP for further management of her high blood pressure and heart failure.  I believe the patient injured her toe and will manage it conservatively for contusion. Return-to-clinic precautions discussed, patient verbalized understanding.    Wallis Bamberg, [place] 01/17/18 2111

## 2018-01-18 ENCOUNTER — Telehealth: Payer: Self-pay | Admitting: Family Medicine

## 2018-01-18 NOTE — Telephone Encounter (Signed)
Pt would like to have a cardiology referral. She will be seen by Dr Artist PaisYoo next week and would like to discuss that at her appointment.

## 2018-01-18 NOTE — Telephone Encounter (Signed)
Will forward to MD to make her aware.  Cheryl Anderson,CMA  

## 2018-01-19 DIAGNOSIS — H40213 Acute angle-closure glaucoma, bilateral: Secondary | ICD-10-CM | POA: Diagnosis not present

## 2018-01-23 ENCOUNTER — Other Ambulatory Visit: Payer: Self-pay

## 2018-01-23 ENCOUNTER — Ambulatory Visit (INDEPENDENT_AMBULATORY_CARE_PROVIDER_SITE_OTHER): Payer: PPO | Admitting: Family Medicine

## 2018-01-23 ENCOUNTER — Encounter: Payer: Self-pay | Admitting: Family Medicine

## 2018-01-23 VITALS — BP 160/66 | HR 58 | Temp 97.5°F | Ht 66.0 in | Wt 149.0 lb

## 2018-01-23 DIAGNOSIS — I5032 Chronic diastolic (congestive) heart failure: Secondary | ICD-10-CM

## 2018-01-23 DIAGNOSIS — I10 Essential (primary) hypertension: Secondary | ICD-10-CM

## 2018-01-23 NOTE — Progress Notes (Signed)
    Subjective:  Cheryl Anderson is a 84 y.o. female [facility]  HPI:  HTN At home last night 109-114/58, did feel a little dizzy at that time but was sitting down so was able to manage it well and this feeling soon resolved. Does get off balance sometimes. Never fallen or passed out because of feeling dizzy even with other low blood pressures she has had in the past. States her BP also runs high sometimes, went to urgent care 1 week ago because it was 218/11 at that time. Was told to add amlodpine but she did not start because of the low blood pressures readings she had at home after the urgent care visit. She currently takes ramipril 5mg  qhs, imdur 30mg  qhs, and  lasix 20mg  every morning.  No CP, SOB, orthopnea, snoring, PND. States the chronic swelling in her legs is actually quite good today compared to her usual.  ROS: Per HPI  Objective:  Physical Exam: BP (!) 160/66   Pulse (!) 58   Temp (!) 97.5 F (36.4 C) (Oral)   Ht 5\' 6"  (1.676 m)   Wt 149 lb (67.6 kg)   SpO2 96%   BMI 24.05 kg/m  Gen: NAD, resting comfortably CV: RRR with no murmurs appreciated Pulm: NWOB, CTAB with no crackles, wheezes, or rhonchi GI: Normal bowel sounds present. Soft, Nontender, Nondistended. MSK: trace pitting edema to midshin bilaterally Skin: warm, dry Neuro: grossly normal, moves all extremities Psych: Normal affect and thought content   Assessment/Plan:  HYPERTENSION, BENIGN ESSENTIAL Patient has had long standing labile blood pressures, most recently elevated to 200/110s as outpatient. However per patient report and ambulatory BP monitoring she does have significantly lower BPs at home as well. Given her report of recent symptomatic hypotension at home, reluctant to start additional agent at this time so will increase ramipril from 5mg  to 10 qhs. She will continue imdur 30mg  qd and lasix 20mg  qd. I will also refer her to cardiology as my concern is  that her occasional very elevated blood pressures may be adversely effecting her HFpEF and we have been been able to get patient on an antihypertensive medication regimen that works for her.    Cheryl Cheryl J Jocilyn Trego, DO PGY-2, [facility] 01/23/2018 9:07 AM

## 2018-01-23 NOTE — Patient Instructions (Addendum)
Increase ramipril from 5 mg to 10mg    Keep taking imdur and lasix.  Do not start amlodipine.   Check blood pressures at home and let me know if you feel lightheaded like you are going pass out.   I'll put in a referral to cardiology, so call us if you do not hear about scheduling an appointment after 2 weeks.

## 2018-01-23 NOTE — Assessment & Plan Note (Signed)
Patient has had long standing labile blood pressures, most recently elevated to 200/110s as outpatient. However per patient report and ambulatory BP monitoring she does have significantly lower BPs at home as well. Given her report of recent symptomatic hypotension at home, reluctant to start additional agent at this time so will increase ramipril from 5mg  to 10 qhs. She will continue imdur 30mg  qd and lasix 20mg  qd. I will also refer her to cardiology as my concern is that her occasional very elevated blood pressures may be adversely effecting her HFpEF and we have been been able to get patient on an antihypertensive medication regimen that works for her.

## 2018-02-08 ENCOUNTER — Encounter: Payer: Self-pay | Admitting: Family Medicine

## 2018-02-08 DIAGNOSIS — I2510 Atherosclerotic heart disease of native coronary artery without angina pectoris: Secondary | ICD-10-CM | POA: Insufficient documentation

## 2018-02-14 ENCOUNTER — Telehealth: Payer: Self-pay | Admitting: Student

## 2018-02-14 NOTE — Telephone Encounter (Signed)
FM after hour call response: Called patient in response to her page on after hour pager.  Granddaughter picked the phone and passed it to the patient.  Patient reports swelling and numbness in her feet, right greater than left.  This has been going on for 2 to 3 days.  She also reports feeling burning sensation over the bottom of her right foot.  She has history of leg edema and neuropathy.  She reports taking her Lasix and gabapentin.  She denies fever, skin redness, skin break, chest pain, shortness of breath or recent long car ride of flight.  Then ganddaughter came to the phone and states that her blood pressure was elevated to 190/93 but an hour ago.  She says she was not able to repeat a blood pressure because the blood pressure cuff was broken.  Few minutes later, she says she replaced the battery and repeat blood pressure is 126/123. She states that she ready to bring her to ED. I told her that is a good idea if her blood pressure is that evaluated.

## 2018-02-15 ENCOUNTER — Ambulatory Visit: Payer: PPO | Admitting: Internal Medicine

## 2018-02-15 ENCOUNTER — Other Ambulatory Visit: Payer: Self-pay

## 2018-02-15 ENCOUNTER — Ambulatory Visit (INDEPENDENT_AMBULATORY_CARE_PROVIDER_SITE_OTHER): Payer: PPO | Admitting: Family Medicine

## 2018-02-15 ENCOUNTER — Encounter: Payer: Self-pay | Admitting: Family Medicine

## 2018-02-15 ENCOUNTER — Ambulatory Visit: Payer: PPO

## 2018-02-15 DIAGNOSIS — I10 Essential (primary) hypertension: Secondary | ICD-10-CM

## 2018-02-15 DIAGNOSIS — I5032 Chronic diastolic (congestive) heart failure: Secondary | ICD-10-CM | POA: Diagnosis not present

## 2018-02-15 MED ORDER — AMLODIPINE BESYLATE 5 MG PO TABS
5.0000 mg | ORAL_TABLET | Freq: Every day | ORAL | 3 refills | Status: DC
Start: 2018-02-15 — End: 2018-06-07

## 2018-02-15 NOTE — Assessment & Plan Note (Addendum)
Given high home readings, will add low dose amlodipine.  Given trace edema, a future option would be to increase furosemide.  Also beware of over treatment.  Avoid orthostasis.    Called and informed of reassuring lab tests.  States she is tolerating amlodipine well without lightheadedness.  No change in plans.

## 2018-02-15 NOTE — Progress Notes (Signed)
   Subjective:    Patient ID: Cheryl Anderson, female    DOB: [birth year], 84 y.o.   MRN: [id number]  HPI 84 yo female with known HBP and an element of white coat hypertension comes in because legs are swollen a bit and home BPs have been running quite high.  Denies CP, headache or SOB.    Reviewing record: Last measure of renal function was Jan.  Also had echo which showed grade 2 diastolic dysfunction.    Good about diet, specifically low salt diet.  She and daughter aware of her wt loss.  Slow.  She has less of an appetite.  No pain or changes in BM.    Review of Systems     Objective:   Physical Exam  VS noted including slow wt loss. Lungs clear Cardiac RRR without m or g Trace bilateral edema.        Assessment & Plan:

## 2018-02-15 NOTE — Patient Instructions (Signed)
I sent in a prescription for a new blood pressure medicine. Let me know if your blood pressure drops too low or if you start feeling lightheaded on standing. I will call Monday with the blood test results. Keep your appointment with the cardiologist  See Dr. Artist Pais in mid June.

## 2018-02-15 NOTE — Assessment & Plan Note (Addendum)
I toyed with idea of increasing lasix given leg swelling.  I decided to not push diuresis given wt loss and poor appetite.  Did BNP and would reconsider increasing lasix if BNP elevated.  Lab shows only mild elevation in BNP.  I did not change lasix dose.

## 2018-02-16 LAB — BASIC METABOLIC PANEL
BUN/Creatinine Ratio: 18 (ref 12–28)
BUN: 20 mg/dL (ref 8–27)
CHLORIDE: 104 mmol/L (ref 96–106)
CO2: 25 mmol/L (ref 20–29)
Calcium: 8.8 mg/dL (ref 8.7–10.3)
Creatinine, Ser: 1.13 mg/dL — ABNORMAL HIGH (ref 0.57–1.00)
GFR calc Af Amer: 50 mL/min/{1.73_m2} — ABNORMAL LOW (ref >59)
GFR calc non Af Amer: 44 mL/min/{1.73_m2} — ABNORMAL LOW (ref >59)
GLUCOSE: 81 mg/dL (ref 65–99)
POTASSIUM: 4.4 mmol/L (ref 3.5–5.2)
SODIUM: 142 mmol/L (ref 134–144)

## 2018-02-16 LAB — BRAIN NATRIURETIC PEPTIDE: BNP: 115 pg/mL — AB (ref 0.0–100.0)

## 2018-03-07 NOTE — Progress Notes (Signed)
Referring-Elsia Artist Pais DO Reason for referral-chronic diastolic congestive heart failure  HPI: 84 year old female for evaluation of hypertension and chronic diastolic congestive heart failure at request of Jeneen Rinks DO.  Echocardiogram January 2019 showed vigorous LV systolic function, mild left ventricular hypertrophy, moderate diastolic dysfunction and mild right atrial enlargement.  Patient does have some dyspnea on exertion but no orthopnea or PND.  Occasional mild pedal edema.  She states that she has some chest discomfort after walking long distances that can last an hour at a time.  She does not have this with routine activity.  She does not have her symptoms at rest.  She denies syncope.  Because of the above cardiology asked to evaluate.  Current Outpatient Medications  Medication Sig Dispense Refill  . alendronate (FOSAMAX) 70 MG tablet Take 1 tablet (70 mg total) by mouth once a week. Take with a full glass of water on an empty stomach. 4 tablet 2  . amLODipine (NORVASC) 5 MG tablet Take 1 tablet (5 mg total) by mouth daily. 90 tablet 3  . aspirin EC 81 MG tablet Take 1 tablet by mouth daily.    Marland Kitchen atorvastatin (LIPITOR) 80 MG tablet TAKE 1 TABLET BY MOUTH EVERY DAY 90 tablet 1  . Calcium Carbonate-Vitamin D 600-400 MG-UNIT chew tablet Chew 1 tablet by mouth 2 (two) times daily. 120 tablet 12  . furosemide (LASIX) 20 MG tablet Take 1 tablet (20 mg total) by mouth daily. 30 tablet 1  . isosorbide mononitrate (IMDUR) 30 MG 24 hr tablet EVERY DAY 30 tablet 2  . loratadine (CLARITIN) 10 MG tablet Take 1 tablet (10 mg total) by mouth daily. 90 tablet 1  . nitroGLYCERIN (NITROSTAT) 0.4 MG SL tablet Take every 5 minutes for 3 doses. If pain not resolved, call 911 or go to ER. 30 tablet 2  . ramipril (ALTACE) 5 MG capsule Take 1 capsule (5 mg total) by mouth daily. (Patient taking differently: Take 10 mg by mouth daily. ) 90 capsule 3  . gabapentin (NEURONTIN) 300 MG capsule Take 1 capsule (300  mg total) by mouth at bedtime. For RLS 30 capsule 3   No current facility-administered medications for this visit.     Allergies  Allergen Reactions  . Codeine Nausea Only     Past Medical History:  Diagnosis Date  . BPPV (benign paroxysmal positional vertigo) 03/16/2016  . Callus of foot 04/01/2014  . CHF (congestive heart failure) (HCC)   . Heart disorder   . Hyperlipidemia   . Hypertension   . Leg cramps, sleep related 05/07/2012  . Osteoporosis   . Plantar fasciitis 04/01/2014  . Subcutaneous nodule of chest wall 12/15/2010    Past Surgical History:  Procedure Laterality Date  . No prior surgery      Social History   Socioeconomic History  . Marital status: Widowed    Spouse name: Not on file  . Number of children: 6  . Years of education: 95  . Highest education level: Not on file  Occupational History  . 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 Financial resource strain: Not on file  . Food insecurity:    Worry: Not on file    Inability: Not on file  . Transportation needs:    Medical: Not on file    Non-medical: Not on file  Tobacco Use  . Smoking status: Current Every Day Smoker    Packs/day: 1.00    Years:  69.00    Pack years: 69.00    Types: Cigarettes  . Smokeless tobacco: Never Used  Substance and Sexual Activity  . Alcohol use: Yes    Alcohol/week: 0.0 oz    Comment: OCCASIONALLY  . Drug use: No  . Sexual activity: Never  Lifestyle  . Physical activity:    Days per week: Not on file    Minutes per session: Not on file  . Stress: Not on file  Relationships  . Social connections:    Talks on phone: Not on file    Gets together: Not on file    Attends religious service: Not on file    Active member of club or organization: Not on file    Attends meetings of clubs or organizations: Not on file    Relationship status: Not on file  . Intimate partner violence:    Fear of current or ex partner: Not on file     Emotionally abused: Not on file    Physically abused: Not on file    Forced sexual activity: Not on file  Other Topics Concern  . Not on file  Social History Narrative   Health Care POA:    Emergency Contact: daughter, Rinaldo Cloud [phone]   End of Life Plan: gave pt AD pamphlet   Who lives with you: daughter, 4 grandchildren   Any pets: 4 dogs   Diet: Pt has a varied diet of protein, starch and vegetables.   Exercise: Pt does not have regular exercise routine but does work in garden daily.   Seatbelts: Pt reports wearing seatbelt when in vehicles.    Hobbies: gardening          Family History  Problem Relation Age of Onset  . Cancer Sister   . Cancer Brother        Throat CA  . Diabetes Daughter     ROS: no fevers or chills, productive cough, hemoptysis, dysphasia, odynophagia, melena, hematochezia, dysuria, hematuria, rash, seizure activity, orthopnea, PND, pedal edema, claudication. Remaining systems are negative.  Physical Exam:   Blood pressure (!) 148/64, pulse 63, height  (1.676 m), weight 144 lb (65.3 kg).  General:  Well developed/well nourished in NAD Skin warm/dry Patient not depressed No peripheral clubbing Back-normal HEENT-normal/normal eyelids, poor dentition Neck supple/normal carotid upstroke bilaterally; no bruits; no JVD; no thyromegaly chest - CTA/ normal expansion CV - RRR/normal S1 and S2; no murmurs, rubs or gallops;  PMI nondisplaced Abdomen -NT/ND, no HSM, no mass, + bowel sounds, no bruit 2+ femoral pulses, no bruits Ext-no edema, chords; diminished distal pulses Neuro-grossly nonfocal  ECG -January 17, 2018-sinus bradycardia, biatrial enlargement, no ST changes.  Personally reviewed  A/P  1 chronic diastolic congestive heart failure-patient is not volume overloaded on examination.  Continue present dose of diuretic.  She should follow a low-sodium diet.  2 hypertension-blood pressure is borderline.  It has been labile in the past by  her report.  Her medications were adjusted approximately 1 month ago and her blood pressure has improved.  I have asked her to track her blood pressure at home and keep records.  She will bring her cuff with next office visit and we will correlate with ours.  Adjust her regimen based on follow-up readings.  3 chest pain-patient has some atypical chest pain.  We discussed a functional study today to further assess but she declined.  We will consider in the future if her symptoms worsen.  4 tobacco abuse-patient counseled on discontinuing.  5 hyperlipidemia-continue statin.  Olga Millers, MD

## 2018-03-08 ENCOUNTER — Ambulatory Visit: Payer: PPO | Admitting: Cardiology

## 2018-03-08 ENCOUNTER — Encounter

## 2018-03-08 ENCOUNTER — Encounter: Payer: Self-pay | Admitting: Cardiology

## 2018-03-08 VITALS — BP 148/64 | HR 63 | Ht 66.0 in | Wt 144.0 lb

## 2018-03-08 DIAGNOSIS — I5032 Chronic diastolic (congestive) heart failure: Secondary | ICD-10-CM

## 2018-03-08 DIAGNOSIS — Z720 Tobacco use: Secondary | ICD-10-CM | POA: Diagnosis not present

## 2018-03-08 DIAGNOSIS — R072 Precordial pain: Secondary | ICD-10-CM | POA: Diagnosis not present

## 2018-03-08 DIAGNOSIS — I10 Essential (primary) hypertension: Secondary | ICD-10-CM | POA: Diagnosis not present

## 2018-03-08 NOTE — Patient Instructions (Signed)
Your physician recommends that you schedule a follow-up appointment in: 3 MONTHS WITH DR CRENSHAW  

## 2018-04-08 ENCOUNTER — Other Ambulatory Visit: Payer: Self-pay | Admitting: Family Medicine

## 2018-04-08 DIAGNOSIS — Z9109 Other allergy status, other than to drugs and biological substances: Secondary | ICD-10-CM

## 2018-04-11 ENCOUNTER — Other Ambulatory Visit: Payer: Self-pay | Admitting: Family Medicine

## 2018-04-11 DIAGNOSIS — I5030 Unspecified diastolic (congestive) heart failure: Secondary | ICD-10-CM

## 2018-04-30 ENCOUNTER — Other Ambulatory Visit: Payer: Self-pay | Admitting: Family Medicine

## 2018-04-30 DIAGNOSIS — I10 Essential (primary) hypertension: Secondary | ICD-10-CM

## 2018-05-11 ENCOUNTER — Other Ambulatory Visit: Payer: Self-pay | Admitting: Family Medicine

## 2018-05-11 DIAGNOSIS — I5030 Unspecified diastolic (congestive) heart failure: Secondary | ICD-10-CM

## 2018-05-30 ENCOUNTER — Other Ambulatory Visit: Payer: Self-pay | Admitting: Family Medicine

## 2018-05-30 DIAGNOSIS — I10 Essential (primary) hypertension: Secondary | ICD-10-CM

## 2018-06-06 NOTE — Progress Notes (Signed)
HPI: FU HTN and diastolic CHF.  Echocardiogram January 2019 showed vigorous LV systolic function, mild left ventricular hypertrophy, moderate diastolic dysfunction and mild right atrial enlargement. Since last seen, patient denies dyspnea, chest pain, palpitations or syncope.  Occasional mild pedal edema.  Current Outpatient Medications  Medication Sig Dispense Refill  . alendronate (FOSAMAX) 70 MG tablet Take 1 tablet (70 mg total) by mouth once a week. Take with a full glass of water on an empty stomach. 4 tablet 2  . amLODipine (NORVASC) 5 MG tablet Take 1 tablet (5 mg total) by mouth daily. 90 tablet 3  . aspirin EC 81 MG tablet Take 1 tablet by mouth daily.    Marland Kitchen atorvastatin (LIPITOR) 80 MG tablet TAKE 1 TABLET BY MOUTH EVERY DAY 90 tablet 1  . Calcium Carbonate-Vitamin D 600-400 MG-UNIT chew tablet Chew 1 tablet by mouth 2 (two) times daily. 120 tablet 12  . furosemide (LASIX) 20 MG tablet TAKE 1 TABLET BY MOUTH EVERY DAY 90 tablet 3  . isosorbide mononitrate (IMDUR) 30 MG 24 hr tablet TAKE 1 TABLET BY MOUTH EVERY DAY 90 tablet 3  . loratadine (CLARITIN) 10 MG tablet TAKE 1 TABLET BY MOUTH EVERY DAY 90 tablet 1  . nitroGLYCERIN (NITROSTAT) 0.4 MG SL tablet Take every 5 minutes for 3 doses. If pain not resolved, call 911 or go to ER. 30 tablet 2  . ramipril (ALTACE) 5 MG capsule Take 1 capsule (5 mg total) by mouth daily. (Patient taking differently: Take 10 mg by mouth daily. ) 90 capsule 3  . gabapentin (NEURONTIN) 300 MG capsule Take 1 capsule (300 mg total) by mouth at bedtime. For RLS 30 capsule 3   No current facility-administered medications for this visit.      Past Medical History:  Diagnosis Date  . BPPV (benign paroxysmal positional vertigo) 03/16/2016  . Callus of foot 04/01/2014  . CHF (congestive heart failure) (HCC)   . Heart disorder   . Hyperlipidemia   . Hypertension   . Leg cramps, sleep related 05/07/2012  . Osteoporosis   . Plantar fasciitis 04/01/2014  .  Subcutaneous nodule of chest wall 12/15/2010    Past Surgical History:  Procedure Laterality Date  . No prior surgery      Social History   Socioeconomic History  . Marital status: Widowed    Spouse name: Not on file  . Number of children: 6  . Years of education: 67  . Highest education level: Not on file  Occupational History  . 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 Financial resource strain: Not on file  . Food insecurity:    Worry: Not on file    Inability: Not on file  . Transportation needs:    Medical: Not on file    Non-medical: Not on file  Tobacco Use  . Smoking status: Current Every Day Smoker    Packs/day: 1.00    Years: 69.00    Pack years: 69.00    Types: Cigarettes  . Smokeless tobacco: Never Used  Substance and Sexual Activity  . Alcohol use: Yes    Alcohol/week: 0.0 standard drinks    Comment: OCCASIONALLY  . Drug use: No  . Sexual activity: Never  Lifestyle  . Physical activity:    Days per week: Not on file    Minutes per session: Not on file  . Stress: Not on file  Relationships  . Social connections:  Talks on phone: Not on file    Gets together: Not on file    Attends religious service: Not on file    Active member of club or organization: Not on file    Attends meetings of clubs or organizations: Not on file    Relationship status: Not on file  . Intimate partner violence:    Fear of current or ex partner: Not on file    Emotionally abused: Not on file    Physically abused: Not on file    Forced sexual activity: Not on file  Other Topics Concern  . Not on file  Social History Narrative   Health Care POA:    Emergency Contact: daughter, Rinaldo Cloudamela [phone]   End of Life Plan: gave pt AD pamphlet   Who lives with you: daughter, 4 grandchildren   Any pets: 4 dogs   Diet: Pt has a varied diet of protein, starch and vegetables.   Exercise: Pt does not have regular exercise routine but does work in  garden daily.   Seatbelts: Pt reports wearing seatbelt when in vehicles.    Hobbies: gardening          Family History  Problem Relation Age of Onset  . Cancer Sister   . Cancer Brother        Throat CA  . Diabetes Daughter     ROS: no fevers or chills, productive cough, hemoptysis, dysphasia, odynophagia, melena, hematochezia, dysuria, hematuria, rash, seizure activity, orthopnea, PND, pedal edema, claudication. Remaining systems are negative.  Physical Exam: Well-developed well-nourished in no acute distress.  Skin is warm and dry.  HEENT is normal.  Neck is supple.  Chest is clear to auscultation with normal expansion.  Cardiovascular exam is regular rate and rhythm.  Abdominal exam nontender or distended. No masses palpated. Extremities show no edema. neuro grossly intact   A/P  1 chronic diastolic congestive heart failure-she appears to be euvolemic on examination.  Continue present dose of diuretic.  Continue low-sodium diet and fluid restriction.  Check potassium and renal function.  2 hypertension-blood pressure is elevated.  Increase amlodipine to 10 mg daily and follow.  3 chest pain-patient has not had any recent symptoms.  No further evaluation for now.  4 hyperlipidemia-continue statin.  5 tobacco abuse-patient counseled on discontinuing.  Olga MillersBrian Crenshaw, MD

## 2018-06-07 ENCOUNTER — Encounter: Payer: Self-pay | Admitting: Cardiology

## 2018-06-07 ENCOUNTER — Ambulatory Visit (INDEPENDENT_AMBULATORY_CARE_PROVIDER_SITE_OTHER): Payer: PPO | Admitting: Cardiology

## 2018-06-07 VITALS — BP 189/78 | HR 63 | Ht 66.0 in | Wt 151.2 lb

## 2018-06-07 DIAGNOSIS — Z720 Tobacco use: Secondary | ICD-10-CM

## 2018-06-07 DIAGNOSIS — E7800 Pure hypercholesterolemia, unspecified: Secondary | ICD-10-CM | POA: Diagnosis not present

## 2018-06-07 DIAGNOSIS — I5030 Unspecified diastolic (congestive) heart failure: Secondary | ICD-10-CM

## 2018-06-07 DIAGNOSIS — I10 Essential (primary) hypertension: Secondary | ICD-10-CM

## 2018-06-07 MED ORDER — AMLODIPINE BESYLATE 10 MG PO TABS: 10.0000 mg | ORAL_TABLET | Freq: Every day | ORAL | 3 refills | Status: DC

## 2018-06-07 NOTE — Patient Instructions (Signed)
Medication Instructions:   INCREASE AMLODIPINE TO 10 MG ONCE DAILY= 2 OF THE 5 MG TABLETS ONCE DAILY  Follow-Up:  Your physician wants you to follow-up in: 6 MONTHS WITH DR CRENSHAW You will receive a reminder letter in the mail two months in advance. If you don't receive a letter, please call our office to schedule the follow-up appointment.   If you need a refill on your cardiac medications before your next appointment, please call your pharmacy.    

## 2018-07-24 DIAGNOSIS — H26492 Other secondary cataract, left eye: Secondary | ICD-10-CM | POA: Diagnosis not present

## 2018-07-24 DIAGNOSIS — H40213 Acute angle-closure glaucoma, bilateral: Secondary | ICD-10-CM | POA: Diagnosis not present

## 2018-09-10 ENCOUNTER — Emergency Department (HOSPITAL_COMMUNITY)
Admission: EM | Admit: 2018-09-10 | Discharge: 2018-09-10 | Disposition: A | Discharge status: Home / Self Care | Payer: PPO | Attending: Emergency Medicine | Admitting: Emergency Medicine

## 2018-09-10 ENCOUNTER — Emergency Department (HOSPITAL_COMMUNITY): Payer: PPO

## 2018-09-10 ENCOUNTER — Encounter (HOSPITAL_COMMUNITY): Payer: Self-pay | Admitting: Emergency Medicine

## 2018-09-10 ENCOUNTER — Other Ambulatory Visit: Payer: Self-pay

## 2018-09-10 ENCOUNTER — Ambulatory Visit (HOSPITAL_COMMUNITY)
Admission: EM | Admit: 2018-09-10 | Discharge: 2018-09-10 | Disposition: A | Discharge status: Home / Self Care | Payer: PPO | Source: Home / Self Care

## 2018-09-10 DIAGNOSIS — H81399 Other peripheral vertigo, unspecified ear: Secondary | ICD-10-CM | POA: Diagnosis not present

## 2018-09-10 DIAGNOSIS — J449 Chronic obstructive pulmonary disease, unspecified: Secondary | ICD-10-CM | POA: Insufficient documentation

## 2018-09-10 DIAGNOSIS — R42 Dizziness and giddiness: Secondary | ICD-10-CM | POA: Diagnosis not present

## 2018-09-10 DIAGNOSIS — R0602 Shortness of breath: Secondary | ICD-10-CM | POA: Insufficient documentation

## 2018-09-10 DIAGNOSIS — R001 Bradycardia, unspecified: Secondary | ICD-10-CM | POA: Diagnosis not present

## 2018-09-10 DIAGNOSIS — Z79899 Other long term (current) drug therapy: Secondary | ICD-10-CM | POA: Diagnosis not present

## 2018-09-10 DIAGNOSIS — I509 Heart failure, unspecified: Secondary | ICD-10-CM | POA: Diagnosis not present

## 2018-09-10 DIAGNOSIS — I2510 Atherosclerotic heart disease of native coronary artery without angina pectoris: Secondary | ICD-10-CM | POA: Diagnosis not present

## 2018-09-10 DIAGNOSIS — Z7982 Long term (current) use of aspirin: Secondary | ICD-10-CM | POA: Diagnosis not present

## 2018-09-10 DIAGNOSIS — F17210 Nicotine dependence, cigarettes, uncomplicated: Secondary | ICD-10-CM | POA: Insufficient documentation

## 2018-09-10 DIAGNOSIS — I110 Hypertensive heart disease with heart failure: Secondary | ICD-10-CM | POA: Insufficient documentation

## 2018-09-10 DIAGNOSIS — Z9114 Patient's other noncompliance with medication regimen: Secondary | ICD-10-CM | POA: Insufficient documentation

## 2018-09-10 LAB — BASIC METABOLIC PANEL
Anion gap: 9 (ref 5–15)
BUN: 12 mg/dL (ref 8–23)
CALCIUM: 8.8 mg/dL — AB (ref 8.9–10.3)
CHLORIDE: 103 mmol/L (ref 98–111)
CO2: 26 mmol/L (ref 22–32)
CREATININE: 1.07 mg/dL — AB (ref 0.44–1.00)
GFR calc Af Amer: 54 mL/min — ABNORMAL LOW (ref >60)
GFR calc non Af Amer: 47 mL/min — ABNORMAL LOW (ref >60)
Glucose, Bld: 112 mg/dL — ABNORMAL HIGH (ref 70–99)
Potassium: 4.2 mmol/L (ref 3.5–5.1)
SODIUM: 138 mmol/L (ref 135–145)

## 2018-09-10 LAB — CBG MONITORING, ED: GLUCOSE-CAPILLARY: 101 mg/dL — AB (ref 70–99)

## 2018-09-10 LAB — URINALYSIS, ROUTINE W REFLEX MICROSCOPIC
BILIRUBIN URINE: NEGATIVE
Glucose, UA: NEGATIVE mg/dL
Hgb urine dipstick: NEGATIVE
KETONES UR: NEGATIVE mg/dL
NITRITE: NEGATIVE
Protein, ur: NEGATIVE mg/dL
SPECIFIC GRAVITY, URINE: 1.017 (ref 1.005–1.030)
pH: 7 (ref 5.0–8.0)

## 2018-09-10 LAB — I-STAT TROPONIN, ED: Troponin i, poc: 0 ng/mL (ref 0.00–0.08)

## 2018-09-10 LAB — CBC
HEMATOCRIT: 43 % (ref 36.0–46.0)
Hemoglobin: 13.5 g/dL (ref 12.0–15.0)
MCH: 29.7 pg (ref 26.0–34.0)
MCHC: 31.4 g/dL (ref 30.0–36.0)
MCV: 94.7 fL (ref 80.0–100.0)
Platelets: 261 10*3/uL (ref 150–400)
RBC: 4.54 MIL/uL (ref 3.87–5.11)
RDW: 13.8 % (ref 11.5–15.5)
WBC: 5.7 10*3/uL (ref 4.0–10.5)
nRBC: 0 % (ref 0.0–0.2)

## 2018-09-10 LAB — BRAIN NATRIURETIC PEPTIDE: B Natriuretic Peptide: 111.3 pg/mL — ABNORMAL HIGH (ref 0.0–100.0)

## 2018-09-10 MED ORDER — MECLIZINE HCL 25 MG PO TABS
12.5000 mg | ORAL_TABLET | Freq: Once | ORAL | Status: AC
  Administered 2018-09-10: 12.5 mg via ORAL
  Filled 2018-09-10: qty 1

## 2018-09-10 MED ORDER — MECLIZINE HCL 12.5 MG PO TABS: 12.5000 mg | ORAL_TABLET | Freq: Three times a day (TID) | ORAL | 0 refills | Status: DC | PRN

## 2018-09-10 NOTE — ED Provider Notes (Signed)
[facility] EMERGENCY DEPARTMENT Provider Note   CSN: [id number] Arrival date & time: 09/10/18  1518     History   Chief Complaint Chief Complaint  Patient presents with  . Dizziness    HPI Cheryl Anderson is a 84 y.o. female.  84 year old female presents with intermittent dizziness times several days.  Patient feels off balance when she tries to walk but has not had any syncope or near syncope.  Denies any associated visual changes.  No confusion or slurred speech.  No weakness to her arms or legs.  No recent history of volume loss.  No recent changes to any medications.  Symptoms are better with remaining still.  Denies any hearing loss or tinnitus.  No prior history of same and no treatment used for this prior to arrival.     Past Medical History:  Diagnosis Date  . BPPV (benign paroxysmal positional vertigo) 03/16/2016  . Callus of foot 04/01/2014  . CHF (congestive heart failure) (HCC)   . Heart disorder   . Hyperlipidemia   . Hypertension   . Leg cramps, sleep related 05/07/2012  . Osteoporosis   . Plantar fasciitis 04/01/2014  . Subcutaneous nodule of chest wall 12/15/2010    Patient Active Problem List   Diagnosis Date Noted  . CAD (coronary artery disease) 02/08/2018  . (HFpEF) heart failure with preserved ejection fraction (HCC) 11/07/2017  . Bilateral lower extremity edema 05/24/2017  . Hearing loss 03/16/2016  . Visual impairment 03/16/2016  . Peripheral arterial disease (HCC) 03/13/2015  . Osteoarthritis of right knee 02/01/2015  . Unspecified vitamin D deficiency 08/20/2013  . Osteoporosis, unspecified 07/25/2013  . Positive PPD 02/10/2011  . Hyperlipidemia LDL goal < 160 12/27/2006  . TOBACCO USE 12/27/2006  . HYPERTENSION, BENIGN ESSENTIAL 12/27/2006  . CHRONIC OBSTRUCTIVE PULMONARY DISEASE 12/27/2006  . GASTROESOPHAGEAL REFLUX, NO ESOPHAGITIS 12/07/2006    Past Surgical History:  Procedure Laterality Date  . No prior surgery         OB History   None      Home Medications    Prior to Admission medications   Medication Sig Start Date End Date Taking? Authorizing Provider  alendronate (FOSAMAX) 70 MG tablet Take 1 tablet (70 mg total) by mouth once a week. Take with a full glass of water on an empty stomach. 11/07/17   Leland Her, DO  amLODipine (NORVASC) 10 MG tablet Take 1 tablet (10 mg total) by mouth daily. 06/07/18   Lewayne Bunting, MD  aspirin EC 81 MG tablet Take 1 tablet by mouth daily.    [provider]  atorvastatin (LIPITOR) 80 MG tablet TAKE 1 TABLET BY MOUTH EVERY DAY 11/21/17   Leland Her, DO  Calcium Carbonate-Vitamin D 600-400 MG-UNIT chew tablet Chew 1 tablet by mouth 2 (two) times daily. 11/07/17   Leland Her, DO  furosemide (LASIX) 20 MG tablet TAKE 1 TABLET BY MOUTH EVERY DAY 05/11/18   Leland Her, DO  gabapentin (NEURONTIN) 300 MG capsule Take 1 capsule (300 mg total) by mouth at bedtime. For RLS 11/02/16 11/02/17  Jeneen Rinks J, DO  isosorbide mononitrate (IMDUR) 30 MG 24 hr tablet TAKE 1 TABLET BY MOUTH EVERY DAY 05/30/18   Leland Her, DO  loratadine (CLARITIN) 10 MG tablet TAKE 1 TABLET BY MOUTH EVERY DAY 04/09/18   Leland Her, DO  nitroGLYCERIN (NITROSTAT) 0.4 MG SL tablet Take every 5 minutes for 3 doses. If pain not resolved, call  911 or go to ER. 11/02/16   Leland HerYoo, Elsia J, DO  ramipril (ALTACE) 5 MG capsule Take 1 capsule (5 mg total) by mouth daily. Patient taking differently: Take 10 mg by mouth daily.  11/07/17   Leland HerYoo, Elsia J, DO    Family History Family History  Problem Relation Age of Onset  . Cancer Sister   . Cancer Brother        Throat CA  . Diabetes Daughter     Social History Social History   Tobacco Use  . Smoking status: Current Every Day Smoker    Packs/day: 1.00    Years: 69.00    Pack years: 69.00    Types: Cigarettes  . Smokeless tobacco: Never Used  Substance Use Topics  . Alcohol use: Yes    Alcohol/week: 0.0 standard drinks    Comment:  OCCASIONALLY  . Drug use: No     Allergies   Codeine   Review of Systems Review of Systems  All other systems reviewed and are negative.    Physical Exam Updated Vital Signs BP (!) 186/72 (BP Location: Right Arm)   Pulse (!) 58   Temp 98 F (36.7 C) (Oral)   Resp 16   Ht 1.702 m (5\' 7" )   Wt 67.6 kg   SpO2 99%   BMI 23.34 kg/m   Physical Exam  Constitutional: She is oriented to person, place, and time. She appears well-developed and well-nourished.  Non-toxic appearance. No distress.  HENT:  Head: Normocephalic and atraumatic.  Eyes: Pupils are equal, round, and reactive to light. Conjunctivae, EOM and lids are normal.  Neck: Normal range of motion. Neck supple. No tracheal deviation present. No thyroid mass present.  Cardiovascular: Normal rate, regular rhythm and normal heart sounds. Exam reveals no gallop.  No murmur heard. Pulmonary/Chest: Effort normal and breath sounds normal. No stridor. No respiratory distress. She has no decreased breath sounds. She has no wheezes. She has no rhonchi. She has no rales.  Abdominal: Soft. Normal appearance and bowel sounds are normal. She exhibits no distension. There is no tenderness. There is no rebound and no CVA tenderness.  Musculoskeletal: Normal range of motion. She exhibits no edema or tenderness.  Neurological: She is alert and oriented to person, place, and time. She has normal strength. She displays no tremor. No cranial nerve deficit or sensory deficit. Gait abnormal. GCS eye subscore is 4. GCS verbal subscore is 5. GCS motor subscore is 6.  Strength is 5 out of 5 in all 4 extremities  Skin: Skin is warm and dry. No abrasion and no rash noted.  Psychiatric: She has a normal mood and affect. Her speech is normal and behavior is normal.  Nursing note and vitals reviewed.    ED Treatments / Results  Labs (all labs ordered are listed, but only abnormal results are displayed) Labs Reviewed  BASIC METABOLIC PANEL -  Abnormal; Notable for the following components:      Result Value   Glucose, Bld 112 (*)    Creatinine, Ser 1.07 (*)    Calcium 8.8 (*)    GFR calc non Af Amer 47 (*)    GFR calc Af Amer 54 (*)    All other components within normal limits  CBC  URINALYSIS, ROUTINE W REFLEX MICROSCOPIC  BRAIN NATRIURETIC PEPTIDE  CBG MONITORING, ED  I-STAT TROPONIN, ED    EKG EKG Interpretation  Date/Time:  Monday September 10 2018 15:36:01 EST Ventricular Rate:  54 PR Interval:  152 QRS Duration: 74 QT Interval:  436 QTC Calculation: 413 R Axis:   78 Text Interpretation:  Sinus bradycardia Biatrial enlargement Nonspecific ST abnormality Abnormal ECG No significant change since last tracing Confirmed by Lorre Nick ([id number]) on 09/10/2018 5:56:41 PM   Radiology No results found.  Procedures Procedures (including critical care time)  Medications Ordered in ED Medications - No data to display   Initial Impression / Assessment and Plan / ED Course  I have reviewed the triage vital signs and the nursing notes.  Pertinent labs & imaging results that were available during my care of the patient were reviewed by me and considered in my medical decision making (see chart for details).    Patient's daughter informed me that the patient does have a history of vertigo.  Will treat with Antivert here.  Still some concern for central and MRI is pending  10:08 PM Patient given Antivert and feels better.  Brain MRI negative for stroke.  Neurological exam at time of discharge is stable.  SPECT peripheral vertigo as she has a history of this  Final Clinical Impressions(s) / ED Diagnoses   Final diagnoses:  SOB (shortness of breath)    ED Discharge Orders    None       Lorre Nick, MD 09/10/18 2209

## 2018-09-10 NOTE — ED Triage Notes (Signed)
Pt SN pt to go for eval in ED; family verbalized understanding

## 2018-09-10 NOTE — ED Notes (Signed)
Pt. Daughter reports that pt has history of vertigo and has been out of her medication.

## 2018-09-10 NOTE — ED Notes (Signed)
Patient verbalizes understanding of discharge instructions. Opportunity for questioning and answers were provided. Armband removed by staff, pt discharged from ED in wheelchair.  

## 2018-09-10 NOTE — ED Triage Notes (Signed)
Per pt she has been having some dizziness and SOB SINCE Saturday. Pt is alert and no signs of distress at this time.

## 2018-09-13 ENCOUNTER — Telehealth: Payer: Self-pay

## 2018-09-13 NOTE — Telephone Encounter (Signed)
Patient daughter called to say patient seen at ED for dizziness. Still having the dizziness even with the meclizine prescribed. No other symptoms. Appt made in [facility] for 09/14/18. Please advise if any other advice for patient.  [phone]  Ples SpecterAlisa Brake, RN [facility])

## 2018-09-13 NOTE — Telephone Encounter (Signed)
Agree with eval in ATC clinic

## 2018-09-14 ENCOUNTER — Ambulatory Visit (INDEPENDENT_AMBULATORY_CARE_PROVIDER_SITE_OTHER): Payer: PPO | Admitting: Family Medicine

## 2018-09-14 VITALS — BP 108/62 | HR 55 | Temp 97.7°F | Ht 67.0 in | Wt 150.0 lb

## 2018-09-14 DIAGNOSIS — Z23 Encounter for immunization: Secondary | ICD-10-CM

## 2018-09-14 DIAGNOSIS — R42 Dizziness and giddiness: Secondary | ICD-10-CM

## 2018-09-14 NOTE — Patient Instructions (Addendum)
It was great meeting you today! I am sorry you have been having so much trouble with the light-headedness. We discussed the possibilities including peripheral vertigo vs low blood pressure. I think your symptoms and history are more consistent with low blood pressure. I would like for you to stop the lasix and half the amlodipine to 5mg . I would also like for you to check your blood pressure 4-5 times per day. IF you are not feeling any better on Monday please give me a call and we can discuss things.

## 2018-09-17 ENCOUNTER — Encounter: Payer: Self-pay | Admitting: Family Medicine

## 2018-09-17 DIAGNOSIS — R42 Dizziness and giddiness: Secondary | ICD-10-CM | POA: Insufficient documentation

## 2018-09-17 NOTE — Assessment & Plan Note (Addendum)
Patient without typical symptoms of peripheral vertigo.  MRI brain ruled out central causes of vertigo.  Patient symptoms do not appear to be positional except when patient stands up.  Patient's blood pressure quite a bit lower than her baseline in clinic.  She states that she has not been hydrating very well and did eat a lot of salty food over Thanksgiving. It is possible patient could have more rare causes of peripheral vertigo including labyrinthitis given her history of recent upper respiratory infection.  More likely I think the patient is having symptoms due to hypotension.  Asked patient to stop her furosemide and decrease dose of amlodipine to 5 mg.  She is to call me on 12/9 on 12/10 if her symptoms do not improve.  If they do not I would likely prescribe steroids to help with labyrinthitis.  Otherwise patient can follow-up with PCP for blood pressure management.  Advised patient to stop taking meclizine due to significant side effect burden in the elderly. -Stop furosemide, decrease amlodipine to 5 mg -If symptoms do not improve please call early next week -If symptoms do improve, follow-up with PCP for more long-term blood pressure management.

## 2018-09-17 NOTE — Progress Notes (Signed)
   HPI 84 year old female who presents for dizziness.  Patient seen on 11/11/2017 and was in our department.  Work-up consisted of an MRI brain which was negative for any intracranial abnormality.  Patient was diagnosed with peripheral vertigo, which she has a known history of and sent home with meclizine.  She states the meclizine has helped her "just a little bit" but has not resolved the majority of her symptoms.  She describes her dizziness is more of a feeling of lightheadedness.  She does not endorse the room spinning or seeing double.  She states that her "head just feels a little lighter".  She always has her symptoms but notes that they do increase quite a bit when she stands up.  Of note patient systolic blood pressure 108 in clinic today.  Looking back patient is usually quite hypertensive with her usual blood pressures in the 160s.  She states that she has had upper respiratory infection the last couple weeks.  She states it was a "minor cold" that resolved any difficulties.  Her symptoms mainly consisted of a cough and congestion with possibly a mild fever.  CC: Dizziness   ROS:  Review of Systems See HPI for ROS.   CC, SH/smoking status, and VS noted  Objective: BP 108/62   Pulse (!) 55   Temp 97.7 F (36.5 C) (Oral)   Ht 5\' 7"  (1.702 m)   Wt 150 lb (68 kg)   SpO2 96%   BMI 23.49 kg/m  Gen: Elderly African-American female, resting comfortably in bed, no acute distress. HEENT: NCAT, EOMI, PERRL.  No horizontal or vertical nystagmus elicited. CV: RRR, no murmur.  Palpable peripheral pulses Resp: CTAB, no wheezes, non-labored Neuro: Alert oriented x4, speech clear.  CN II through XII intact.  No focal neurologic deficits.  No horizontal or vertical nystagmus noted.  Somewhat frequent   Assessment and plan:  Light-headedness Patient without typical symptoms of peripheral vertigo.  MRI brain ruled out central causes of vertigo.  Patient symptoms do not appear to be  positional except when patient stands up.  Patient's blood pressure quite a bit lower than her baseline in clinic.  She states that she has not been hydrating very well and did eat a lot of salty food over Thanksgiving. It is possible patient could have more rare causes of peripheral vertigo including labyrinthitis given her history of recent upper respiratory infection.  More likely I think the patient is having symptoms due to hypotension.  Asked patient to stop her furosemide and decrease dose of amlodipine to 5 mg.  She is to call me on 12/9 on 12/10 if her symptoms do not improve.  If they do not I would likely prescribe steroids to help with labyrinthitis.  Otherwise patient can follow-up with PCP for blood pressure management.  Advised patient to stop taking meclizine due to significant side effect burden in the elderly. -Stop furosemide, decrease amlodipine to 5 mg -If symptoms do not improve please call early next week -If symptoms do improve, follow-up with PCP for more long-term blood pressure management.   Orders Placed This Encounter  Procedures  . Flu Vaccine QUAD 36+ mos IM    No orders of the defined types were placed in this encounter.    Myrene BuddyJacob Dempsy Damiano MD PGY-2 Family Medicine Resident  09/17/2018 10:07 PM

## 2018-09-26 ENCOUNTER — Ambulatory Visit (INDEPENDENT_AMBULATORY_CARE_PROVIDER_SITE_OTHER): Payer: PPO | Admitting: Family Medicine

## 2018-09-26 ENCOUNTER — Encounter: Payer: Self-pay | Admitting: Family Medicine

## 2018-09-26 VITALS — BP 144/72 | HR 63 | Temp 98.3°F | Ht 67.0 in | Wt 157.2 lb

## 2018-09-26 DIAGNOSIS — I10 Essential (primary) hypertension: Secondary | ICD-10-CM | POA: Diagnosis not present

## 2018-09-26 DIAGNOSIS — R42 Dizziness and giddiness: Secondary | ICD-10-CM

## 2018-09-26 MED ORDER — RAMIPRIL 5 MG PO CAPS: 10.0000 mg | ORAL_CAPSULE | Freq: Every day | ORAL | Status: DC

## 2018-09-26 MED ORDER — AMLODIPINE BESYLATE 5 MG PO TABS
5.0000 mg | ORAL_TABLET | Freq: Every day | ORAL | Status: DC
Start: 2018-09-26 — End: 2020-05-08

## 2018-09-26 NOTE — Progress Notes (Signed)
    Subjective:  Cheryl Anderson is a 84 y.o. female who [facility] today with a chief complaint of dizziness.   HPI:  Patient states that she has been having lightheadedness upon standing intermittently.  She says that it causes her to "walk like she is drunk" but that it self resolved.  She has stopped her Lasix and decrease her amlodipine to 5 mg daily as instructed.  She is still taking her ramipril and Imdur.  She takes some of her blood pressure medications at night and some in the morning.  She does have some swelling in her legs that is very mild and only really happens at night.  She does wear her compression stockings.  She follows with a cardiologist who is also been watching her blood pressures which have continued to be labile.  She has her home blood pressure monitoring machine with her today and her home readings have been ranging from the mid 160s over low 90s to mid 90s over 60s. She also has ringing in her ears.  She does not have room spinning.  She has hearing aids but she does not wear because she forgets about them.  Her daughter is requesting for her to see ENT they are worried about vertigo.  ROS: Per HPI   Objective:  Physical Exam: BP (!) 144/72   Pulse 63   Temp 98.3 F (36.8 C) (Oral)   Ht 5\' 7"  (1.702 m)   Wt 157 lb 4 oz (71.3 kg)   SpO2 93%   BMI 24.63 kg/m   Gen: NAD, resting comfortably CV: RRR with no murmurs appreciated Pulm: NWOB, CTAB with no crackles, wheezes, or rhonchi GI: Normal bowel sounds present. Soft, Nontender, Nondistended. MSK: no edema, cyanosis, or clubbing noted Skin: warm, dry Neuro: grossly normal, moves all extremities Psych: Normal affect and thought content  Assessment/Plan:  Light-headedness Long discussion today that patient does not have the typical symptoms of peripheral vertigo and that she has had a normal brain MRI.  She has no positional symptoms or room spinning.  She does have tinnitus but this is not new  or acutely worsened.  Patient and family would like to be referred to ENT, referral placed today.  I suspect that her lightheadedness is due to her lower blood pressures.  Recommended changing the timing of her blood pressure medication administration to bedtime to mitigate her symptoms.   Cheryl HerElsia J Apryll Hinkle, DO PGY-3, [facility] 09/26/2018 10:27 AM

## 2018-09-26 NOTE — Assessment & Plan Note (Signed)
Long discussion today that patient does not have the typical symptoms of peripheral vertigo and that she has had a normal brain MRI.  She has no positional symptoms or room spinning.  She does have tinnitus but this is not new or acutely worsened.  Patient and family would like to be referred to ENT, referral placed today.  I suspect that her lightheadedness is due to her lower blood pressures.  Recommended changing the timing of her blood pressure medication administration to bedtime to mitigate her symptoms.

## 2018-09-26 NOTE — Patient Instructions (Addendum)
Change your blood pressure medications to at night.    Referral to ENT placed today.    See you back in a couple of months or after seeing ENT

## 2018-10-26 ENCOUNTER — Ambulatory Visit (INDEPENDENT_AMBULATORY_CARE_PROVIDER_SITE_OTHER): Payer: PPO | Admitting: Family Medicine

## 2018-10-26 ENCOUNTER — Other Ambulatory Visit: Payer: Self-pay

## 2018-10-26 VITALS — BP 140/70 | HR 66 | Temp 98.0°F | Ht 69.0 in | Wt 154.0 lb

## 2018-10-26 DIAGNOSIS — R609 Edema, unspecified: Secondary | ICD-10-CM | POA: Diagnosis not present

## 2018-10-26 DIAGNOSIS — Z716 Tobacco abuse counseling: Secondary | ICD-10-CM | POA: Diagnosis not present

## 2018-10-26 DIAGNOSIS — R42 Dizziness and giddiness: Secondary | ICD-10-CM

## 2018-10-26 NOTE — Assessment & Plan Note (Signed)
Explained the patient smoking is her biggest long-term health the detractor.  Explained this is also risk factor for valvular incompetency, leg swelling.  Discussed methods for smoking cessation including but limited to Wellbutrin, Chantix, nicotine replacement therapy.  Patient think about options and let us know if she is interested.  Also gave information about 1-800-quit-now.

## 2018-10-26 NOTE — Assessment & Plan Note (Signed)
Patient with history of HFpEF which is likely contributing some.  Explained patient that all age as well as smoking can lead to valve incompetency in the veins.  Patient to continue elevating legs when possible when not active, and using compression stockings.  She does have some at home but is not been eating recently.  Explained that the right fit should be enough to where she feels the compression but not too tight.  No indication for restarting Lasix at this time.  Would likely never restart secondary to advanced age, but will leave decision up to PCP long-term.

## 2018-10-26 NOTE — Assessment & Plan Note (Signed)
Patient's dizziness has seemingly resolved.  She is still interested in seeing ENT for this.  Checked on referral and has been approved but pressure is not been contacted by ENT.  Patient to call ENT to pick up status, advised her to let us know if we can help in any way.

## 2018-10-26 NOTE — Progress Notes (Signed)
   HPI 85 year old female who presents for bilateral lower extremity swelling.  Patient states that the swelling is almost always present towards the end of the day.  She notices that it resolves when she sleeps or lays down flat.  She used to be on furosemide, but this was stopped by this provider on 09/14/2018.  She presented to that visit with lightheadedness, felt to be due to hypotension.  Patient does have a history of HFpEF for which she took furosemide.  She is  He has had no shortness of breath other while laying flat or while walking.  Again her lower extremity edema does improve after laying flat.  She has been able to walk several blocks without getting short of breath which is her baseline.  She is also interested in referral to ENT.  This was placed on 12/18 by Dr. Artist Pais.  It looks like it was approved, but the patient not been contacted for some reason.  Reason for referral was her dizziness.  This is actually resolved and she has had no symptoms since mid December.  CC: Lateral lower extremity swelling   ROS:   Review of Systems See HPI for ROS.   CC, SH/smoking status, and VS noted  Objective: BP 140/70 (BP Location: Right Arm, Patient Position: Sitting, Cuff Size: Normal)   Pulse 66   Temp 98 F (36.7 C) (Oral)   Ht 5\' 9"  (1.753 m)   Wt 154 lb (69.9 kg)   SpO2 99%   BMI 22.74 kg/m  Gen: Pleasant 85 year old African-American female, no acute distress, resting comfortably CV: RRR, no murmur.  1+ pitting edema bilateral lower extremities.  Palpable peripheral pulses. Resp: Lungs clear to auscultation bilaterally, no acute distress, accessory muscle use Abd: SNTND, BS present, no guarding or organomegaly Neuro: Alert and oriented, Speech clear, No gross deficits   Assessment and plan:  Edema Patient with history of HFpEF which is likely contributing some.  Explained patient that all age as well as smoking can lead to valve incompetency in the veins.  Patient to  continue elevating legs when possible when not active, and using compression stockings.  She does have some at home but is not been eating recently.  Explained that the right fit should be enough to where she feels the compression but not too tight.  No indication for restarting Lasix at this time.  Would likely never restart secondary to advanced age, but will leave decision up to PCP long-term.  Encounter for smoking cessation counseling Explained the patient smoking is her biggest long-term health the detractor.  Explained this is also risk factor for valvular incompetency, leg swelling.  Discussed methods for smoking cessation including but limited to Wellbutrin, Chantix, nicotine replacement therapy.  Patient think about options and let us know if she is interested.  Also gave information about 1-800-quit-now.  Dizziness Patient's dizziness has seemingly resolved.  She is still interested in seeing ENT for this.  Checked on referral and has been approved but pressure is not been contacted by ENT.  Patient to call ENT to pick up status, advised her to let us know if we can help in any way.   No orders of the defined types were placed in this encounter.   No orders of the defined types were placed in this encounter.    Myrene Buddy MD PGY-2 Family Medicine Resident  10/26/2018 11:34 AM

## 2018-10-26 NOTE — Patient Instructions (Signed)
It was great seeing you today!  The swelling in your legs is due to incompetent valves in your veins.  We went over the anatomy and why this happens.  Essentially it is due to old age and your smoking.  I do not think starting medication for this is a good idea at this point.  I recommend elevation and compression.  As much as possible throughout the day he can elevate your legs when you are not being active.  I will also recommend getting compression stockings.  You will need to be time after you feel the compression but not too tight towards cutting off circulation.  We discussed the various methods to help with smoking cessation.  We discussed Chantix, Wellbutrin, and nicotine replacement.  If he finds you become interested in stopping smoking please let either myself, your PCP, Dr. Raymondo BandKoval know we have a help.  Also give you a card for 1-800-quit-now, they can help as they have tried psychologist on the phone 24 hours a day and oftentimes getting free medications.  Checked on me referral for Dr. Avel Sensoreoh's office and it looks like your referral has been approved.  I am not sure they have not contacted you.  I recommend calling the office and checking the status.  The number for Dr. Lubertha Basqueaylor's office is [phone], and after that office can assist you in getting to the bottom of the referral.

## 2018-11-05 ENCOUNTER — Ambulatory Visit (HOSPITAL_COMMUNITY)
Admission: EM | Admit: 2018-11-05 | Discharge: 2018-11-05 | Disposition: A | Discharge status: Home / Self Care | Payer: PPO | Attending: Urgent Care | Admitting: Urgent Care

## 2018-11-05 ENCOUNTER — Other Ambulatory Visit: Payer: Self-pay

## 2018-11-05 ENCOUNTER — Ambulatory Visit (INDEPENDENT_AMBULATORY_CARE_PROVIDER_SITE_OTHER): Payer: PPO

## 2018-11-05 ENCOUNTER — Encounter (HOSPITAL_COMMUNITY): Payer: Self-pay | Admitting: Urgent Care

## 2018-11-05 ENCOUNTER — Telehealth (HOSPITAL_COMMUNITY): Payer: Self-pay | Admitting: Emergency Medicine

## 2018-11-05 DIAGNOSIS — W19XXXA Unspecified fall, initial encounter: Secondary | ICD-10-CM

## 2018-11-05 DIAGNOSIS — S2231XA Fracture of one rib, right side, initial encounter for closed fracture: Secondary | ICD-10-CM | POA: Diagnosis not present

## 2018-11-05 DIAGNOSIS — I10 Essential (primary) hypertension: Secondary | ICD-10-CM

## 2018-11-05 DIAGNOSIS — I509 Heart failure, unspecified: Secondary | ICD-10-CM | POA: Diagnosis not present

## 2018-11-05 DIAGNOSIS — M79672 Pain in left foot: Secondary | ICD-10-CM

## 2018-11-05 DIAGNOSIS — S92301A Fracture of unspecified metatarsal bone(s), right foot, initial encounter for closed fracture: Secondary | ICD-10-CM

## 2018-11-05 DIAGNOSIS — S92354A Nondisplaced fracture of fifth metatarsal bone, right foot, initial encounter for closed fracture: Secondary | ICD-10-CM

## 2018-11-05 DIAGNOSIS — R0781 Pleurodynia: Secondary | ICD-10-CM | POA: Diagnosis not present

## 2018-11-05 DIAGNOSIS — S2241XA Multiple fractures of ribs, right side, initial encounter for closed fracture: Secondary | ICD-10-CM | POA: Diagnosis not present

## 2018-11-05 DIAGNOSIS — I110 Hypertensive heart disease with heart failure: Secondary | ICD-10-CM | POA: Diagnosis not present

## 2018-11-05 DIAGNOSIS — I25810 Atherosclerosis of coronary artery bypass graft(s) without angina pectoris: Secondary | ICD-10-CM

## 2018-11-05 MED ORDER — ACETAMINOPHEN-CODEINE #3 300-30 MG PO TABS: 1.0000 | ORAL_TABLET | Freq: Three times a day (TID) | ORAL | 0 refills | Status: DC | PRN

## 2018-11-05 MED ORDER — ACETAMINOPHEN 325 MG PO TABS
650.0000 mg | ORAL_TABLET | Freq: Once | ORAL | Status: AC
  Administered 2018-11-05: 650 mg via ORAL

## 2018-11-05 MED ORDER — ACETAMINOPHEN 325 MG PO TABS
ORAL_TABLET | ORAL | Status: AC
  Filled 2018-11-05: qty 2

## 2018-11-05 NOTE — ED Triage Notes (Signed)
Pt cc fall pt states she slipped in some bubble product on the floor at home this morning. Pt states she hurt her right side and right ankle.

## 2018-11-05 NOTE — ED Notes (Signed)
Per Wallis Bamberg, PA, pt given incentive spirometer and given instructions on how to use it by the PA.

## 2018-11-05 NOTE — Telephone Encounter (Signed)
Pt seen today and given rx for pain meds; pharmacy called and nausea meds added

## 2018-11-05 NOTE — ED Provider Notes (Addendum)
MRN: [id number] DOB: [birth year]  Subjective:   Cheryl Anderson is a 85 y.o. female presenting for suffering a fall this morning.  Patient reports the fall was accidental, slipped on a bubble product on the floor at home this morning.  States that she has had some right-sided rib and flank pain.  Pain is moderate to severe, pretty constant, has a hard time taking a big deep breath.  She also hurt her right foot, reports that the pain is sharp over that lateral side of her foot.  Has not taken anything for pain.  She does have a history of hypertension, heart failure, coronary artery disease, COPD and did not take her blood pressure medication today.  No current facility-administered medications for this encounter.   Current Outpatient Medications:  .  alendronate (FOSAMAX) 70 MG tablet, Take 1 tablet (70 mg total) by mouth once a week. Take with a full glass of water on an empty stomach., Disp: 4 tablet, Rfl: 2 .  amLODipine (NORVASC) 5 MG tablet, Take 1 tablet (5 mg total) by mouth daily., Disp: , Rfl:  .  aspirin EC 81 MG tablet, Take 1 tablet by mouth daily., Disp: , Rfl:  .  atorvastatin (LIPITOR) 80 MG tablet, TAKE 1 TABLET BY MOUTH EVERY DAY, Disp: 90 tablet, Rfl: 1 .  Calcium Carbonate-Vitamin D 600-400 MG-UNIT chew tablet, Chew 1 tablet by mouth 2 (two) times daily., Disp: 120 tablet, Rfl: 12 .  isosorbide mononitrate (IMDUR) 30 MG 24 hr tablet, TAKE 1 TABLET BY MOUTH EVERY DAY, Disp: 90 tablet, Rfl: 3 .  loratadine (CLARITIN) 10 MG tablet, TAKE 1 TABLET BY MOUTH EVERY DAY, Disp: 90 tablet, Rfl: 1 .  nitroGLYCERIN (NITROSTAT) 0.4 MG SL tablet, Take every 5 minutes for 3 doses. If pain not resolved, call 911 or go to ER., Disp: 30 tablet, Rfl: 2 .  ramipril (ALTACE) 5 MG capsule, Take 2 capsules (10 mg total) by mouth daily., Disp: , Rfl:     Allergies  Allergen Reactions  . Codeine Nausea Only    Past Medical History:  Diagnosis Date  . BPPV (benign paroxysmal positional vertigo)  03/16/2016  . Callus of foot 04/01/2014  . CHF (congestive heart failure) (HCC)   . Heart disorder   . Hyperlipidemia   . Hypertension   . Leg cramps, sleep related 05/07/2012  . Osteoporosis   . Plantar fasciitis 04/01/2014  . Subcutaneous nodule of chest wall 12/15/2010     Past Surgical History:  Procedure Laterality Date  . No prior surgery      ROS  Objective:   Vitals: BP (!) 202/72 (BP Location: Right Arm) Comment: has not had blood pressure medicines today  Pulse 62   Temp 97.9 F (36.6 C) (Oral)   Resp 18   Wt 154 lb (69.9 kg)   SpO2 100%   BMI 22.74 kg/m   Physical Exam Constitutional:      General: She is not in acute distress.    Appearance: Normal appearance. She is well-developed. She is not ill-appearing, toxic-appearing or diaphoretic.  HENT:     Head: Normocephalic and atraumatic.     Nose: Nose normal.     Mouth/Throat:     Mouth: Mucous membranes are moist.  Eyes:     Extraocular Movements: Extraocular movements intact.     Pupils: Pupils are equal, round, and reactive to light.  Cardiovascular:     Rate and Rhythm: Normal rate and regular rhythm.     Pulses:  Normal pulses.     Heart sounds: Normal heart sounds. No murmur. No friction rub. No gallop.   Pulmonary:     Effort: Pulmonary effort is normal. No respiratory distress.     Breath sounds: Normal breath sounds. No stridor. No wheezing, rhonchi or rales.  Chest:     Chest wall: Tenderness (over area depicted but without ecchymosis) present. No mass, lacerations, deformity, swelling, crepitus or edema.    Musculoskeletal:     Right ankle: She exhibits normal range of motion, no swelling, no ecchymosis, no deformity, no laceration and normal pulse. No tenderness.     Right foot: Decreased range of motion. Normal capillary refill. Tenderness (without ecchymosis), bony tenderness and swelling present. No crepitus, deformity or laceration.       Feet:  Skin:    General: Skin is warm and dry.      Findings: No rash.  Neurological:     Mental Status: She is alert and oriented to person, place, and time.  Psychiatric:        Mood and Affect: Mood normal.        Behavior: Behavior normal.        Thought Content: Thought content normal.    Dg Ribs Unilateral W/chest Right  Result Date: 11/05/2018 CLINICAL DATA:  Fall today.  Right rib pain. EXAM: RIGHT RIBS AND CHEST - 3+ VIEW COMPARISON:  Two-view chest x-ray 09/10/2018 FINDINGS: Minimally displaced fractures are present anteriorly in the right seventh and eighth ribs. There is fracture through the costochondral junction anteriorly involving the tenth, eleventh, and twelfth ribs. There is no pneumothorax. Small right pleural effusion is suspected. Heart is upper limits of normal. Atherosclerotic changes are noted at the aorta. IMPRESSION: Minimally displaced anterior and lateral right rib fractures. Electronically Signed   By: Marin Robertshristopher  Mattern M.D.   On: 11/05/2018 14:42   Dg Foot Complete Right  Result Date: 11/05/2018 CLINICAL DATA:  Right foot pain due to injury suffered a fall this morning. Initial encounter. EXAM: RIGHT FOOT COMPLETE - 3+ VIEW COMPARISON:  None. FINDINGS: The patient has a nondisplaced fracture of the distal diaphysis and neck of the right fifth metatarsal. The fracture does not involve the articular surface. No other bony or joint abnormality is identified. IMPRESSION: Nondisplaced fracture distal diaphysis and neck the right metatarsal. Electronically Signed   By: Drusilla Kannerhomas  Dalessio M.D.   On: 11/05/2018 14:46   Assessment and Plan :   Fall, initial encounter  Rib pain on right side  Left foot pain  Essential hypertension  Chronic congestive heart failure, unspecified heart failure type (HCC)  Coronary artery disease involving coronary bypass graft without angina pectoris, unspecified whether native or transplanted heart  Closed fracture of multiple ribs of right side, initial encounter  Fracture of  unspecified metatarsal bone(s), right foot, initial encounter for closed fracture  Patient is to perform incentive spirometry, wear postop shoe.  Patient has follow-up appointment tomorrow morning with her PCP.  She is to schedule Tylenol and use Tylenol 3 for breakthrough pain.  Requests appointment for orthopedist through her PCP. Counseled patient on potential for adverse effects with medications prescribed today, patient verbalized understanding. ER and return-to-clinic precautions discussed, patient verbalized understanding.    Wallis BambergMani, Johnwilliam Shepperson, PA-C 11/05/18 1505    Wallis BambergMani, Kamoni Gentles, PA-C 11/05/18 1505

## 2018-11-05 NOTE — Discharge Instructions (Addendum)
Schedule Tylenol at 650mg  every 6 hours for pain control. If you have pain beyond this, then use Tylenol #3 once every 8 hours. Make sure you follow up with your PCP, request a referral to an orthopedist.

## 2018-11-06 ENCOUNTER — Encounter: Payer: Self-pay | Admitting: Family Medicine

## 2018-11-06 ENCOUNTER — Other Ambulatory Visit: Payer: Self-pay

## 2018-11-06 ENCOUNTER — Ambulatory Visit (INDEPENDENT_AMBULATORY_CARE_PROVIDER_SITE_OTHER): Payer: PPO | Admitting: Family Medicine

## 2018-11-06 VITALS — BP 128/64 | HR 61 | Temp 98.1°F

## 2018-11-06 DIAGNOSIS — S92354D Nondisplaced fracture of fifth metatarsal bone, right foot, subsequent encounter for fracture with routine healing: Secondary | ICD-10-CM | POA: Insufficient documentation

## 2018-11-06 DIAGNOSIS — S2241XD Multiple fractures of ribs, right side, subsequent encounter for fracture with routine healing: Secondary | ICD-10-CM

## 2018-11-06 DIAGNOSIS — S2241XA Multiple fractures of ribs, right side, initial encounter for closed fracture: Secondary | ICD-10-CM | POA: Insufficient documentation

## 2018-11-06 MED ORDER — DICLOFENAC SODIUM 1 % TD GEL: 2.0000 g | Freq: Four times a day (QID) | TRANSDERMAL | 1 refills | Status: DC

## 2018-11-06 MED ORDER — LIDOCAINE 5 % EX PTCH: 1.0000 | MEDICATED_PATCH | Freq: Two times a day (BID) | CUTANEOUS | 0 refills | Status: DC

## 2018-11-06 NOTE — Assessment & Plan Note (Addendum)
She was informed of her risk of pneumonia secondary to shallow breathing during her healing.  She was encouraged to return to clinic she thought that she was having trouble breathing or noticed any fevers. -Rib belt for stabilization (not for constant use) -Finish off prescribed Tylenol 3 -Lidoderm patches as needed -Voltaren gel as needed -incentive spirometer use during commercial breaks -Follow-up visit in 2 weeks

## 2018-11-06 NOTE — Progress Notes (Signed)
    Subjective:  Cheryl Anderson is a 85 y.o. female who presents to the [facility] today with a chief complaint of ED follow up for broken ribs/foot.   HPI: Ms. Alers presented to the ED on 1/27 after a mechanical fall.  In the ED, she was found to have 2 broken ribs and a broken fifth metatarsal of the right foot.  In the ED, she was given a surgical boot for her foot and Tylenol 3 for pain control.  She reports that she has been taking the Tylenol 3 every 8 hours which has been helpful for pain control.  She reports no confusion or instability while taking the Tylenol 3.  Her daughter in the room confirmed that she did not seem to have any altered mental status while taking the Tylenol 3.  She is still able to walk around the house though she is certainly inhibited from some activity due to pain in her ribs/hip.  Chief Complaint noted Review of Symptoms - see HPI  Objective:  Physical Exam: BP 128/64   Pulse 61   Temp 98.1 F (36.7 C) (Oral)   SpO2 98%    Gen: NAD, resting comfortably CV: RRR with no murmurs appreciated Pulm: NWOB, CTAB with no crackles, wheezes, or rhonchi GI: Normal bowel sounds present. Soft, Nontender, Nondistended. MSK: Antalgic gait demonstrated around exam room.  Right fifth meta tarsal is tender to palpation without obvious deformity.  No results found for this or any previous visit (from the past 72 hour(s)).   Assessment/Plan:  Multiple closed fractures of ribs of right side She was informed of her risk of pneumonia secondary to shallow breathing during her healing.  She was encouraged to return to clinic she thought that she was having trouble breathing or noticed any fevers. -Rib belt for stabilization (not for constant use) -Finish off prescribed Tylenol 3 -Lidoderm patches as needed -Voltaren gel as needed -Follow-up visit in 2 weeks  Nondisplaced fracture of fifth right metatarsal bone with routine healing -Activity as tolerated -Follow-up x-ray  in 1 week -Follow-up visit in 2 weeks

## 2018-11-06 NOTE — Patient Instructions (Signed)
I'm glad we could touch base today.  You can continue your Tylenol #3 until you run out.  I would also like you to begin using the topical prescriptions that I have ordered for you.  You to not need to use both, I only wanted to give you options.  I suggest starting with the patch and adding the gel if the patch is not working well.  I have also included a prescription for a rib belt.  Please get a follow up foot xray in one week.

## 2018-11-06 NOTE — Assessment & Plan Note (Signed)
-  Activity as tolerated -Follow-up x-ray in 1 week -Follow-up visit in 2 weeks

## 2018-11-07 ENCOUNTER — Telehealth: Payer: Self-pay

## 2018-11-07 NOTE — Telephone Encounter (Signed)
Received fax from pharmacy, PA needed on Lidocaine patch. Clinical questions submitted via Cover My Meds. Waiting on response, could take up to 72 hours.  Cover My Meds info: Key:  [id number]   Ples Specter, RN [facility] RN)

## 2018-11-08 ENCOUNTER — Ambulatory Visit
Admission: RE | Admit: 2018-11-08 | Discharge: 2018-11-08 | Disposition: A | Discharge status: Home / Self Care | Payer: PPO | Source: Ambulatory Visit | Attending: Family Medicine | Admitting: Family Medicine

## 2018-11-08 ENCOUNTER — Other Ambulatory Visit: Payer: Self-pay | Admitting: Family Medicine

## 2018-11-08 DIAGNOSIS — S92351A Displaced fracture of fifth metatarsal bone, right foot, initial encounter for closed fracture: Secondary | ICD-10-CM | POA: Diagnosis not present

## 2018-11-08 DIAGNOSIS — S92354D Nondisplaced fracture of fifth metatarsal bone, right foot, subsequent encounter for fracture with routine healing: Secondary | ICD-10-CM

## 2018-11-08 NOTE — Telephone Encounter (Signed)
Lidocaine patches denied by patient's insurance. Note to prescriber.  Ples Specter, RN [facility] RN)

## 2018-11-08 NOTE — Telephone Encounter (Signed)
I called Ms. Mczeal and informed her that she can buy OTC lidocaine patches (3.6% instead of 5%) which are much less expensive than the $500 that the prescription ones would cost - though potentially equally as effective.  She acknowledged understanding.  Mirian Mo, MD

## 2018-11-08 NOTE — Telephone Encounter (Signed)
Patient is aware patches were not approved. Per patient cost is over $500 out of pocket. Asks for provider to prescribe something else for pain.  Ples Specter, RN [facility] RN)

## 2018-11-10 ENCOUNTER — Other Ambulatory Visit: Payer: Self-pay | Admitting: Family Medicine

## 2018-11-10 DIAGNOSIS — M810 Age-related osteoporosis without current pathological fracture: Secondary | ICD-10-CM

## 2018-11-21 ENCOUNTER — Ambulatory Visit: Payer: PPO | Admitting: Family Medicine

## 2018-11-21 ENCOUNTER — Other Ambulatory Visit: Payer: Self-pay | Admitting: Family Medicine

## 2018-11-21 DIAGNOSIS — I10 Essential (primary) hypertension: Secondary | ICD-10-CM

## 2018-11-26 ENCOUNTER — Encounter: Payer: Self-pay | Admitting: Family Medicine

## 2018-11-26 ENCOUNTER — Ambulatory Visit (INDEPENDENT_AMBULATORY_CARE_PROVIDER_SITE_OTHER): Payer: PPO | Admitting: Family Medicine

## 2018-11-26 ENCOUNTER — Other Ambulatory Visit: Payer: Self-pay

## 2018-11-26 ENCOUNTER — Ambulatory Visit
Admission: RE | Admit: 2018-11-26 | Discharge: 2018-11-26 | Disposition: A | Discharge status: Home / Self Care | Payer: PPO | Source: Ambulatory Visit | Attending: Family Medicine | Admitting: Family Medicine

## 2018-11-26 VITALS — BP 160/64 | HR 66 | Temp 98.4°F | Wt 153.4 lb

## 2018-11-26 DIAGNOSIS — S92354D Nondisplaced fracture of fifth metatarsal bone, right foot, subsequent encounter for fracture with routine healing: Secondary | ICD-10-CM | POA: Diagnosis not present

## 2018-11-26 DIAGNOSIS — I10 Essential (primary) hypertension: Secondary | ICD-10-CM

## 2018-11-26 DIAGNOSIS — S2241XD Multiple fractures of ribs, right side, subsequent encounter for fracture with routine healing: Secondary | ICD-10-CM

## 2018-11-26 DIAGNOSIS — S92591A Other fracture of right lesser toe(s), initial encounter for closed fracture: Secondary | ICD-10-CM | POA: Diagnosis not present

## 2018-11-26 NOTE — Assessment & Plan Note (Signed)
Healing with minimal to moderate pain.  Follow-up x-ray today.

## 2018-11-26 NOTE — Patient Instructions (Signed)
Please get an xray today.   If results require attention, either myself or my nurse will get in touch with you. If everything is normal, you will get a letter in the mail or a message in My Chart. Please give [place] a call if you do not hear from [place] after 2 weeks.   No changes to blood pressure medications today.

## 2018-11-26 NOTE — Progress Notes (Signed)
    Subjective:  Cheryl Anderson is a 85 y.o. female who presents to the [facility] today for R foot fracture follow up  HPI:  Patient is here for follow-up on her broken ribs/foot.  She had a mechanical fall on January 22 and found to have significant symptoms of a right fifth metatarsal fracture.  He states that her ribs are nearly back to baseline.  She has no shortness of breath or wheezing.  She is very little pain along her right lower ribs.  She has not needed any over-the-counter medications for this. She states that her right fifth toe is still somewhat painful.  She is able to bear weight.  She has not fallen.  She wears a sneaker and can walk without assistance.  Patient states that she has not taken her blood pressure today though she is starting to take them at night and she did take them last night.  She does sometimes have lightheadedness and dizziness.  She checks her blood pressures pretty regularly at home and systolics have ranged from the 140s to 160s and her diastolics have been in the 60s. She has no chest pain, shortness of breath, lower extremity edema.     ROS: Per HPI  Social Hx: She reports that she has been smoking cigarettes. She has a 69.00 pack-year smoking history. She has never used smokeless tobacco. She reports current alcohol use. She reports that she does not use drugs.    Objective:  Physical Exam: BP (!) 160/64   Pulse 66   Temp 98.4 F (36.9 C) (Oral)   Wt 153 lb 6 oz (69.6 kg)   SpO2 95%   BMI 22.65 kg/m   Gen: NAD, resting comfortably Pulm: NWOB, CTAB with no crackles, wheezes, or rhonchi GI: Normal bowel sounds present. Soft, Nontender, Nondistended. MSK: slightly TTP over R 5th metatarsal without erythema or edema.  No tenderness to palpation over right lower ribs.   Skin: warm, dry Neuro: grossly normal, moves all extremities. antalgic gait but able to bear weight on R foot.  Psych: Normal affect and thought content   Assessment/Plan:    Multiple closed fractures of ribs of right side   Resolved and doing well.   Nondisplaced fracture of fifth right metatarsal bone with routine healing Healing with minimal to moderate pain.  Follow-up x-ray today.  HYPERTENSION, BENIGN ESSENTIAL Isolated systolic hypertension today.  However patient diastolic precludes more aggressive blood pressure management as well as her symptoms of lightheadedness and dizziness at home.  Her home readings are closer to goal considering her systolics have been in the 140s.  Continue current management.   Leland Her, DO PGY-3, [facility] 11/26/2018 2:21 PM

## 2018-11-26 NOTE — Assessment & Plan Note (Signed)
Isolated systolic hypertension today.  However patient diastolic precludes more aggressive blood pressure management as well as her symptoms of lightheadedness and dizziness at home.  Her home readings are closer to goal considering her systolics have been in the 140s.  Continue current management.

## 2018-11-26 NOTE — Assessment & Plan Note (Signed)
Resolved and doing well.

## 2018-11-29 DIAGNOSIS — H26491 Other secondary cataract, right eye: Secondary | ICD-10-CM | POA: Diagnosis not present

## 2018-12-13 IMAGING — MR MR HEAD W/O CM
10 of 11 series · 43 of 48 positions shown · non-contrast
Comparison: 06/24/2005 CT head.

CLINICAL DATA: 89 y/o F; dizziness and shortness of breath since
[REDACTED]. Ataxia, stroke suspected.

EXAM:
MRI HEAD WITHOUT CONTRAST
TECHNIQUE: Multiplanar, multiecho pulse sequences of the brain and surrounding
structures were obtained without intravenous contrast.

[Series 5: DWI · axial · 3.0mm · 0.88mm/px · z∈[-47,+75]mm · 10 of 84 slices shown (1 of 4)]
[im 1/84]
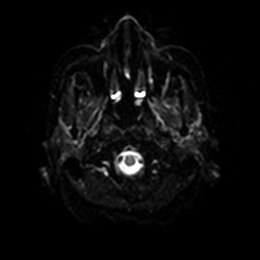
[im 10/84]
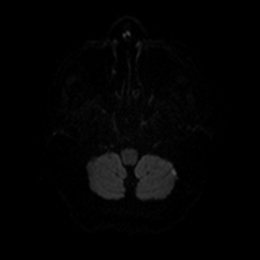
[im 19/84]
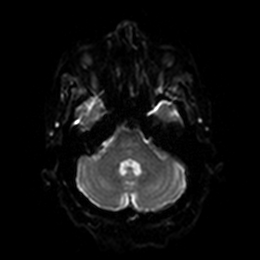
[im 28/84]
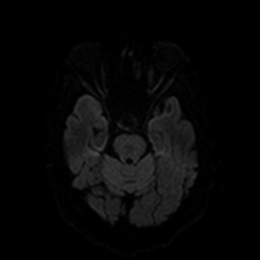
[im 37/84]
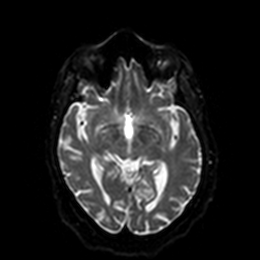
[im 47/84]
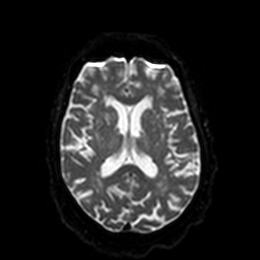
[im 56/84]
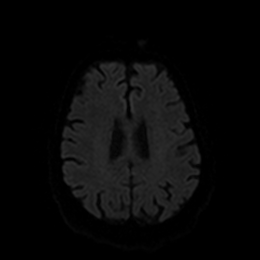
[im 65/84]
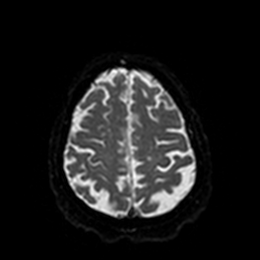
[im 74/84]
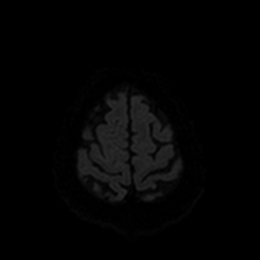
[im 84/84]
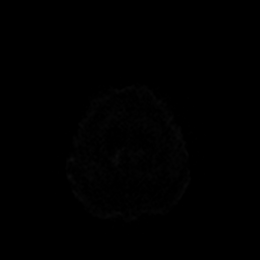

[Series 6: DWI · axial · 3.0mm · 0.88mm/px · z∈[-47,+75]mm · 5 of 42 slices shown (2 of 4)]
[im 1/42]
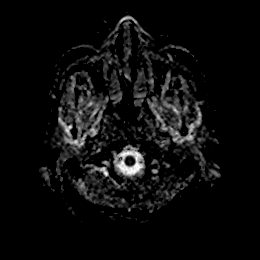
[im 11/42]
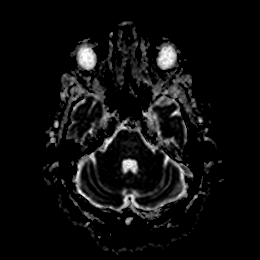
[im 21/42]
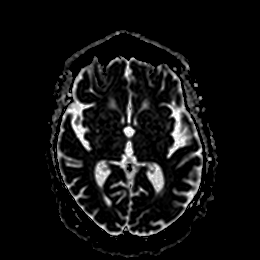
[im 31/42]
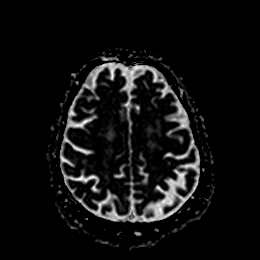
[im 42/42]
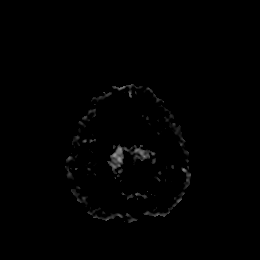

[Series 8: swi_images · axial · 3.0mm · 0.90mm/px · z∈[-65,+75]mm · 5 of 48 slices shown]
[im 1/48]
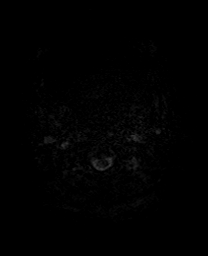
[im 12/48]
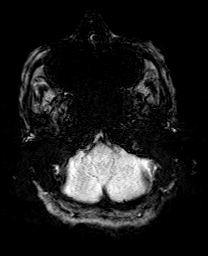
[im 24/48]
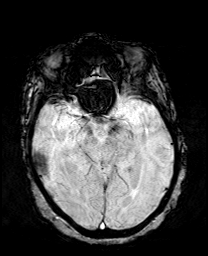
[im 36/48]
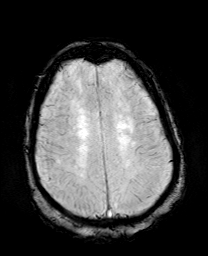
[im 48/48]
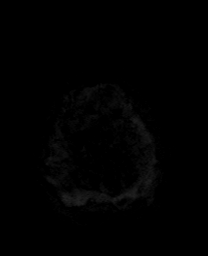

[Series 9: mip_images(sw) · axial · 24.0mm · 0.90mm/px · z∈[-55,+64]mm · 4 of 41 slices shown]
[im 1/41]
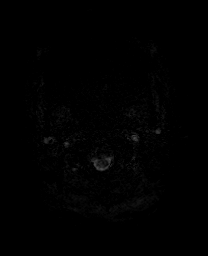
[im 14/41]
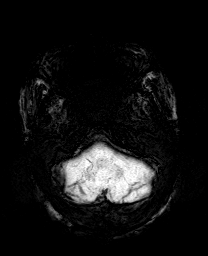
[im 27/41]
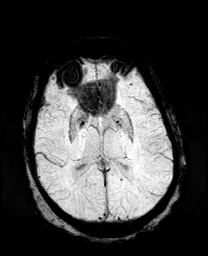
[im 41/41]
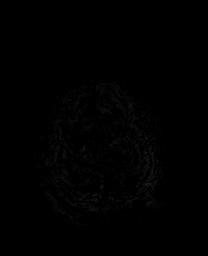

[Series 10: T2 · axial · 5.0mm · 0.72mm/px · z∈[-57,+74]mm · 2 of 23 slices shown (1 of 2)]
[im 1/23]
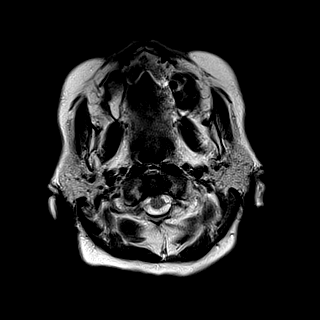
[im 23/23]
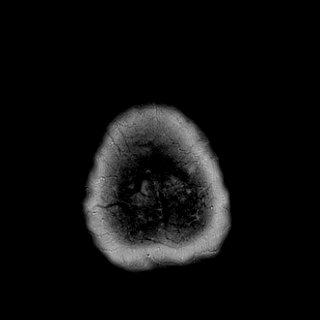

[Series 11: DWI · coronal · 4.0mm · 0.88mm/px · 7 of 64 slices shown (3 of 4)]
[im 1/64]
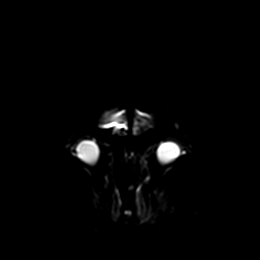
[im 11/64]
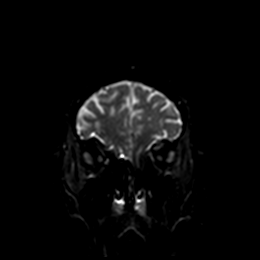
[im 22/64]
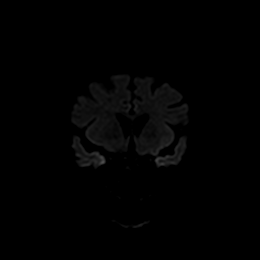
[im 32/64]
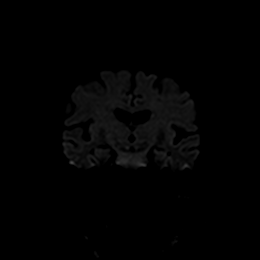
[im 43/64]
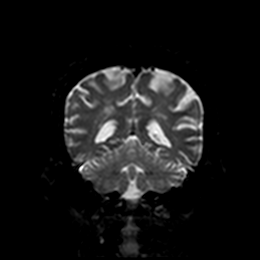
[im 53/64]
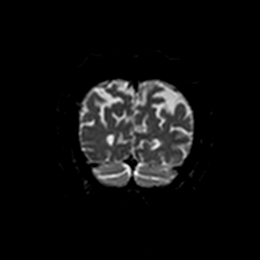
[im 64/64]
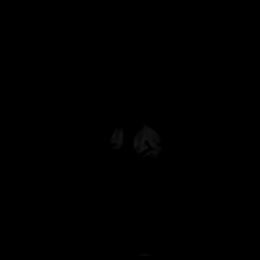

[Series 12: DWI · coronal · 4.0mm · 0.88mm/px · 3 of 32 slices shown (4 of 4)]
[im 1/32]
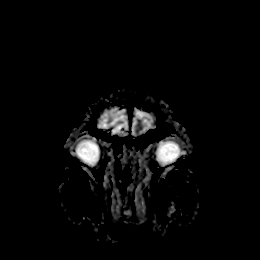
[im 16/32]
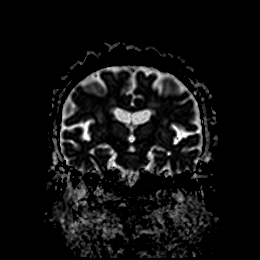
[im 32/32]
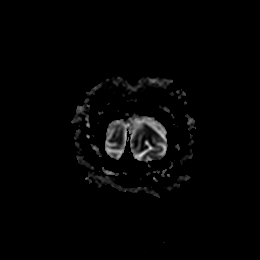

[Series 13: FLAIR · axial · 5.0mm · 0.90mm/px · z∈[-57,+74]mm · 2 of 23 slices shown]
[im 1/23]
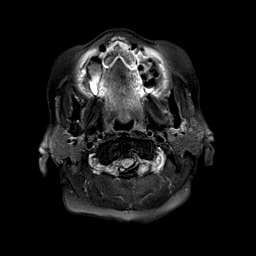
[im 23/23]
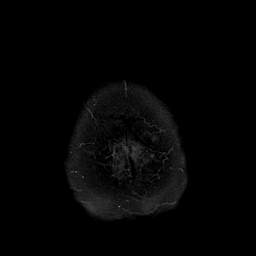

[Series 14: T1 · sagittal · 5.0mm · 0.75mm/px · 2 of 22 slices shown]
[im 1/22]
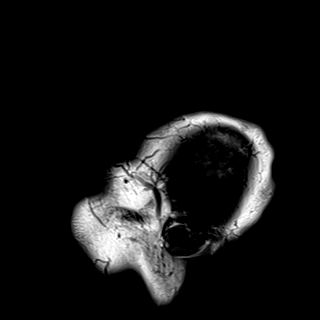
[im 22/22]
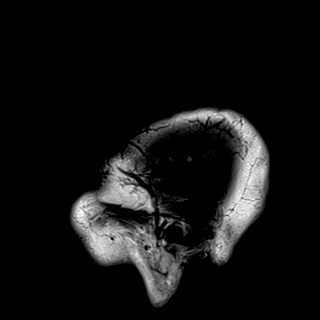

[Series 16: T2 · coronal · 5.0mm · 0.34mm/px · 3 of 27 slices shown (2 of 2)]
[im 1/27]
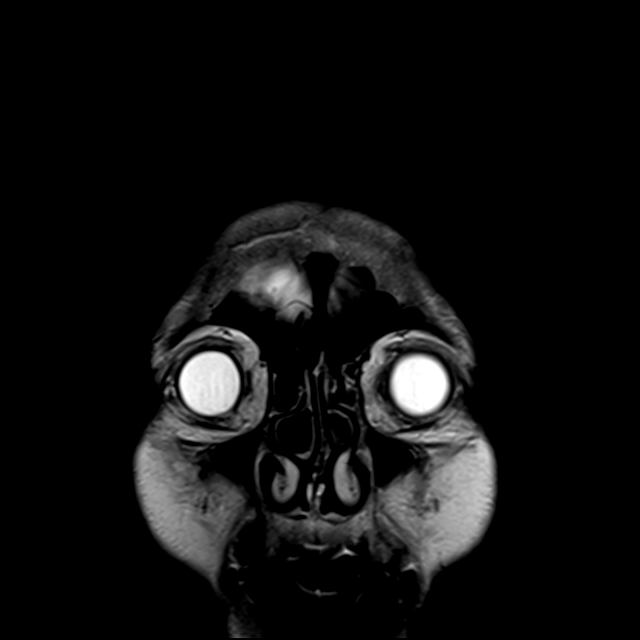
[im 14/27]
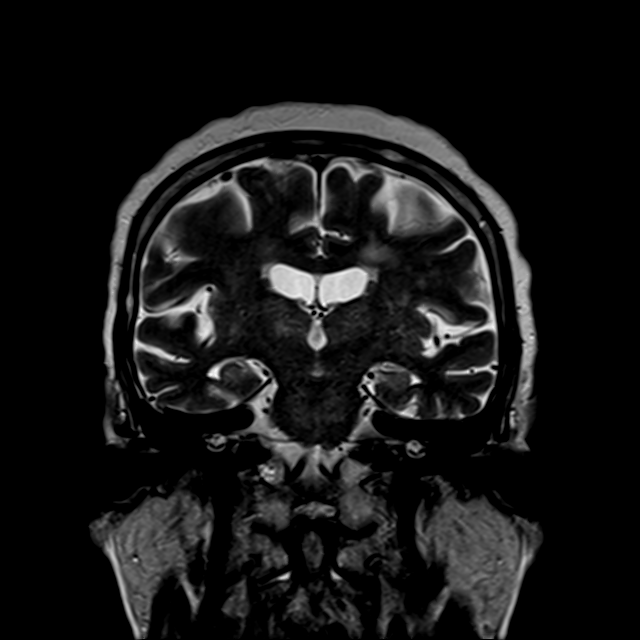
[im 27/27]
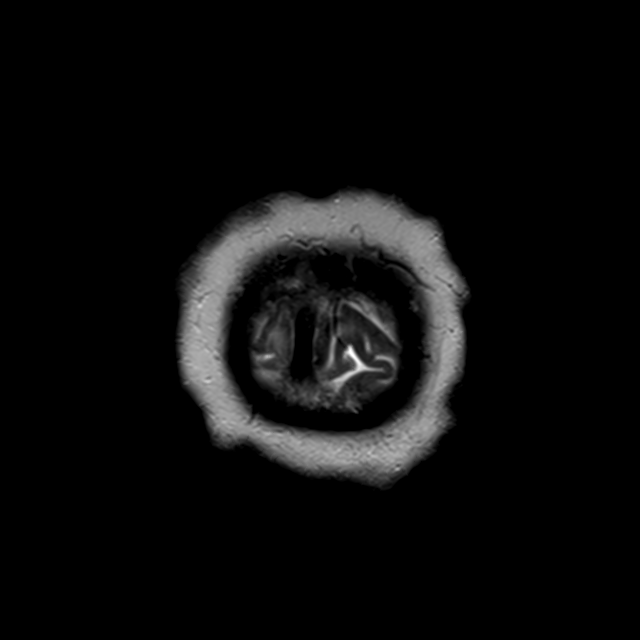

[43 of 48 positions shown; findings below may reference images not displayed]

FINDINGS: Brain: No acute infarction, hemorrhage, hydrocephalus, extra-axial
collection or mass lesion. Nonspecific T2 FLAIR hyperintensities in
subcortical and periventricular white matter as well as the pons are
compatible with moderate chronic microvascular ischemic changes for
age. Moderate volume loss of the brain. Few punctate foci of
susceptibility hypointensity are present scattered throughout the
brain in a nonspecific distribution compatible with hemosiderin
deposition of chronic microhemorrhage.

Vascular: Normal flow voids.

Skull and upper cervical spine: Normal marrow signal.

Sinuses/Orbits: Mild mucosal thickening of the left anterior ethmoid
air cells. Additional visible paranasal sinuses and the mastoid air
cells are normally aerated. Bilateral intra-ocular lens replacement.

Other: None.
IMPRESSION: 1. No acute intracranial abnormality identified.
2. Moderate for age chronic microvascular ischemic changes and
volume loss of the brain.

## 2018-12-18 ENCOUNTER — Encounter: Payer: Self-pay | Admitting: Family Medicine

## 2018-12-18 ENCOUNTER — Ambulatory Visit (INDEPENDENT_AMBULATORY_CARE_PROVIDER_SITE_OTHER): Payer: PPO | Admitting: Family Medicine

## 2018-12-18 ENCOUNTER — Other Ambulatory Visit: Payer: Self-pay

## 2018-12-18 VITALS — BP 136/60 | HR 59 | Temp 98.4°F | Wt 158.0 lb

## 2018-12-18 DIAGNOSIS — S92354D Nondisplaced fracture of fifth metatarsal bone, right foot, subsequent encounter for fracture with routine healing: Secondary | ICD-10-CM

## 2018-12-18 NOTE — Progress Notes (Signed)
    Subjective:  Cheryl Anderson is a 85 y.o. female who presents to the [facility] today for follow-up on her foot  HPI:   Patient states that her broken R foot has continued to improve.  She has far less pain than she did before.  She is able to wear normal sneaker and walk.  She has not needed any over-the-counter pain medication.  She has not fallen.  ROS: Per HPI   Objective:  Physical Exam: BP 136/60   Pulse (!) 59   Temp 98.4 F (36.9 C) (Oral)   Wt 158 lb (71.7 kg)   SpO2 98%   BMI 23.33 kg/m   Gen: NAD, resting comfortably MSK: Nontender to palpation of her right fifth metatarsal without erythema or edema. Skin: warm, dry Neuro: grossly normal, moves all extremities Psych: Normal affect and thought content  Assessment/Plan:  Nondisplaced fracture of fifth right metatarsal bone with routine healing Follow-up x-ray had showed some slight increased displacement.  Patient is symptomatically significantly improved with only mild pain.  Offered repeat x-ray but patient declined.  Follow-up as needed as nearly resolved   Leland Her, DO PGY-3, [facility] 12/18/2018 4:10 PM

## 2018-12-18 NOTE — Assessment & Plan Note (Addendum)
Follow-up x-ray had showed some slight increased displacement.  Patient is symptomatically significantly improved with only mild pain.  Offered repeat x-ray but patient declined.  Follow-up as needed as nearly resolved

## 2019-01-11 ENCOUNTER — Telehealth: Payer: Self-pay | Admitting: Family Medicine

## 2019-01-11 NOTE — Telephone Encounter (Signed)
Patient called with complaint of lower extremity edema.  She wants to restart her Lasix 20 mg daily.  She has not been checking her blood pressures at home.  She reports overall good compliance but did not take her blood pressure medications last night.  She is on amlodipine 5 mg daily, Imdur 30 mg daily, and ramipril 5 mg daily.  She just took her blood pressure and it is 204/112.  She does not have any chest pain, vision changes, headache, difficulty breathing.  Advised patient to resume her blood pressure medications today.  She will restart Lasix 20 mg daily and call me on Monday with her home readings.

## 2019-01-14 NOTE — Telephone Encounter (Signed)
Attempted to call pt again, no answer. Will try again tomorrow. Deseree Bruna Potter, CMA

## 2019-01-14 NOTE — Telephone Encounter (Signed)
Pt LVM on nurse line stating she was calling to give recent BP readings. I called patient back to obtain BP readings, no answer and no option for VM.

## 2019-01-15 NOTE — Telephone Encounter (Signed)
Attempted to reach out to pt. Phone messaged stated that memory was full. Aquilla Solian, CMA

## 2019-01-22 NOTE — Telephone Encounter (Signed)
BP readings appear to be much improved as well and patient symptoms. Continue current medication regimen and agree with plan by CMA.

## 2019-01-22 NOTE — Telephone Encounter (Signed)
Pt returned call about BP readings. Pt only recalled on BP reading since she spoke with PCP on 4/3. Pt stated she had a reading of 132/76 last night. I reminded patient she had a BP of 204/112. Pt stated she has not seen that number since 4/3. Pt stated since starting lasix her LE edema has much improved. Pt to call back if BP returns high or swelling continues. Will route note to PCP.

## 2019-01-25 ENCOUNTER — Other Ambulatory Visit: Payer: Self-pay | Admitting: Family Medicine

## 2019-01-25 DIAGNOSIS — Z9109 Other allergy status, other than to drugs and biological substances: Secondary | ICD-10-CM

## 2019-02-12 ENCOUNTER — Telehealth: Payer: Self-pay

## 2019-02-12 MED ORDER — RISEDRONATE SODIUM 35 MG PO TABS: 35.0000 mg | ORAL_TABLET | ORAL | 3 refills | Status: AC

## 2019-02-12 NOTE — Telephone Encounter (Signed)
[facility] sent fax requesting refill of Risedronate Sodium 35 MG tab. Did not see on med list. Last refilled 11/12/2018. Sunday Spillers, CMA

## 2019-02-22 DIAGNOSIS — H26491 Other secondary cataract, right eye: Secondary | ICD-10-CM | POA: Diagnosis not present

## 2019-04-10 ENCOUNTER — Other Ambulatory Visit: Payer: Self-pay

## 2019-04-10 ENCOUNTER — Telehealth (INDEPENDENT_AMBULATORY_CARE_PROVIDER_SITE_OTHER): Payer: PPO | Admitting: Family Medicine

## 2019-04-10 DIAGNOSIS — J302 Other seasonal allergic rhinitis: Secondary | ICD-10-CM

## 2019-04-10 MED ORDER — FLUTICASONE PROPIONATE 50 MCG/ACT NA SUSP: 2.0000 | Freq: Every day | NASAL | 1 refills | Status: DC

## 2019-04-10 NOTE — Progress Notes (Signed)
[facility] Telemedicine Visit  Patient consented to have virtual visit. Method of visit: Telephone  Encounter participants: Patient: Cheryl Anderson - located at home Provider: [place] Suleyman Ehrman - located at home Others (if applicable): n/a  Chief Complaint: sinus draining  HPI: Patient reports that she has had sneezing and draining and coughing for about 1 week. She thought it would get better but it has not improved. She denies any trouble breathing. She does not use inhalers. Denies any fevers or chills. She has a history of allergies, and has been taking Claritin. She has not tried any other medications. She reports no watery or itching eyes. She denies any sore throat. She denies any sick contacts. Reports that she usually coughs more at night, but with the drainage she has been coughing more. She reports that she still smokes about 1ppd. Tried flonase a few years ago.   Patient says that her legs and feet swell all the time, but it has been worse for a while. She was told to start taking lasix again at 20mg  per day. Denies any orthopnea, not using more pillows than normal.   ROS: per HPI  Pertinent PMHx: HTN, CAD, ?COPD, GERD, HFpEF, PAD,   Exam:  Respiratory: talking in complete sentences without any trouble and able to walk across room on the phone without any heavy breathing noted over the phone  Assessment/Plan:  Seasonal allergies Based on history cough appears to be related to post-nasal drip from allergies. Patient already on claritin. Do not believe this is related to COPD- patient with PFT's in 2009 that showed lung disease likely related to smoking. Patient has no inhalers at home and is not having productive cough or SOB. Do not see need for abx at this time given acuity and sx profile.  -start Flonase, continue claritin -call in 1 week if sx do not improve, sooner if they worsen; ED precautions discussed- patient voiced understanding.   -counseled on smoking cessation, smoking 1ppd -patient may need repeat evaluation for COPD and may need to start medications if sx worsen/persist, previous CXR's reviewed from 2019 and 2012 sow no signs on COPD     Time spent during visit with patient: 12 minutes

## 2019-04-10 NOTE — Assessment & Plan Note (Addendum)
Based on history cough appears to be related to post-nasal drip from allergies. Patient already on claritin. Do not believe this is related to COPD- patient with PFT's in 2009 that showed lung disease likely related to smoking. Patient has no inhalers at home and is not having productive cough or SOB. Do not see need for abx at this time given acuity and sx profile.  -start Flonase, continue claritin -call in 1 week if sx do not improve, sooner if they worsen; ED precautions discussed- patient voiced understanding.  -counseled on smoking cessation, smoking 1ppd -patient may need repeat evaluation for COPD and may need to start medications if sx worsen/persist, previous CXR's reviewed from 2019 and 2012 sow no signs on COPD

## 2019-04-11 ENCOUNTER — Telehealth (INDEPENDENT_AMBULATORY_CARE_PROVIDER_SITE_OTHER): Payer: PPO | Admitting: Family Medicine

## 2019-04-11 ENCOUNTER — Telehealth: Payer: Self-pay | Admitting: Cardiology

## 2019-04-11 NOTE — Progress Notes (Signed)
HPI: FU HTN and diastolic CHF. Echocardiogram January 2019 showed vigorous LV systolic function, mild left ventricular hypertrophy, moderate diastolic dysfunction and mild right atrial enlargement. Since last seen, she denies chest pain, dyspnea or palpitations.  Mild pedal edema.  Current Outpatient Medications  Medication Sig Dispense Refill  . acetaminophen-codeine (TYLENOL #3) 300-30 MG tablet Take 1-2 tablets by mouth every 8 (eight) hours as needed for severe pain. 15 tablet 0  . amLODipine (NORVASC) 5 MG tablet Take 1 tablet (5 mg total) by mouth daily.    Marland Kitchen. aspirin EC 81 MG tablet Take 1 tablet by mouth daily.    Marland Kitchen. atorvastatin (LIPITOR) 80 MG tablet TAKE 1 TABLET BY MOUTH EVERY DAY 90 tablet 1  . Calcium Carbonate-Vitamin D 600-400 MG-UNIT chew tablet Chew 1 tablet by mouth 2 (two) times daily. 120 tablet 12  . diclofenac sodium (VOLTAREN) 1 % GEL Apply 2 g topically 4 (four) times daily. 100 g 1  . fluticasone (FLONASE) 50 MCG/ACT nasal spray Place 2 sprays into both nostrils daily. 16 g 1  . isosorbide mononitrate (IMDUR) 30 MG 24 hr tablet TAKE 1 TABLET BY MOUTH EVERY DAY 90 tablet 3  . lidocaine (LIDODERM) 5 % Place 1 patch onto the skin every 12 (twelve) hours. Remove & Discard patch within 12 hours or as directed by MD 10 patch 0  . loratadine (CLARITIN) 10 MG tablet TAKE 1 TABLET BY MOUTH EVERY DAY 90 tablet 1  . nitroGLYCERIN (NITROSTAT) 0.4 MG SL tablet Take every 5 minutes for 3 doses. If pain not resolved, call 911 or go to ER. 30 tablet 2  . ramipril (ALTACE) 5 MG capsule TAKE 1 CAPSULE BY MOUTH EVERY DAY 90 capsule 3  . risedronate (ACTONEL) 35 MG tablet Take 1 tablet (35 mg total) by mouth every 7 (seven) days. with water on empty stomach, nothing by mouth or lie down for next 30 minutes. 4 tablet 3   No current facility-administered medications for this visit.      Past Medical History:  Diagnosis Date  . BPPV (benign paroxysmal positional vertigo) 03/16/2016   . Callus of foot 04/01/2014  . CHF (congestive heart failure) (HCC)   . Heart disorder   . Hyperlipidemia   . Hypertension   . Leg cramps, sleep related 05/07/2012  . Osteoporosis   . Plantar fasciitis 04/01/2014  . Subcutaneous nodule of chest wall 12/15/2010    Past Surgical History:  Procedure Laterality Date  . No prior surgery      Social History   Socioeconomic History  . Marital status: Widowed    Spouse name: Not on file  . Number of children: 6  . Years of education: 6511  . Highest education level: Not on file  Occupational History  . Occupation: Retired-[employer]    Employer: NOT EMPLOYED  Social Needs  . Financial resource strain: Not on file  . Food insecurity    Worry: Not on file    Inability: Not on file  . Transportation needs    Medical: Not on file    Non-medical: Not on file  Tobacco Use  . Smoking status: Current Every Day Smoker    Packs/day: 1.00    Years: 69.00    Pack years: 69.00    Types: Cigarettes  . Smokeless tobacco: Never Used  Substance and Sexual Activity  . Alcohol use: Yes    Alcohol/week: 0.0 standard drinks    Comment: OCCASIONALLY  . Drug use:  No  . Sexual activity: Never  Lifestyle  . Physical activity    Days per week: Not on file    Minutes per session: Not on file  . Stress: Not on file  Relationships  . Social Herbalist on phone: Not on file    Gets together: Not on file    Attends religious service: Not on file    Active member of club or organization: Not on file    Attends meetings of clubs or organizations: Not on file    Relationship status: Not on file  . Intimate partner violence    Fear of current or ex partner: Not on file    Emotionally abused: Not on file    Physically abused: Not on file    Forced sexual activity: Not on file  Other Topics Concern  . Not on file  Social History Narrative   Health Care POA:    Emergency Contact: daughter, Olin Hauser [phone]   End of Life Plan: gave pt  AD pamphlet   Who lives with you: daughter, 4 grandchildren   Any pets: 4 dogs   Diet: Pt has a varied diet of protein, starch and vegetables.   Exercise: Pt does not have regular exercise routine but does work in garden daily.   Seatbelts: Pt reports wearing seatbelt when in vehicles.    Hobbies: gardening          Family History  Problem Relation Age of Onset  . Cancer Sister   . Cancer Brother        Throat CA  . Diabetes Daughter     ROS: no fevers or chills, productive cough, hemoptysis, dysphasia, odynophagia, melena, hematochezia, dysuria, hematuria, rash, seizure activity, orthopnea, PND, claudication. Remaining systems are negative.  Physical Exam: Well-developed well-nourished in no acute distress.  Skin is warm and dry.  HEENT is normal.  Neck is supple.  Chest is clear to auscultation with normal expansion.  Cardiovascular exam is irregular Abdominal exam nontender or distended. No masses palpated. Extremities show trace to 1+ edema. neuro grossly intact  ECG-sinus rhythm with PACs, no ST changes.  Personally reviewed  A/P  1 chronic diastolic congestive heart failure-patient remains euvolemic.  Continue fluid restriction and low-sodium diet.  Continue present dose of Lasix.  Potassium and renal function.  2 hypertension-patient's blood pressure is controlled.  Continue present medications and follow.  3 hyperlipidemia-continue statin. Check lipids and liver.   4 tobacco abuse-patient counseled on discontinuing.  Kirk Ruths, MD

## 2019-04-11 NOTE — Telephone Encounter (Signed)
° ° °  COVID-19 Pre-Screening Questions: ° °• In the past 7 to 10 days have you had a cough,  shortness of breath, headache, congestion, fever (100 or greater) body aches, chills, sore throat, or sudden loss of taste or sense of smell? no °• Have you been around anyone with known Covid 19.  no °• Have you been around anyone who is awaiting Covid 19 test results in the past 7 to 10 days? no °• Have you been around anyone who has been exposed to Covid 19, or has mentioned symptoms of Covid 19 within the past 7 to 10 days? no ° °If you have any concerns/questions about symptoms patients report during screening (either on the phone or at threshold). Contact the provider seeing the patient or DOD for further guidance.  If neither are available contact a member of the leadership team. ° ° °I called pt to confirm her appt on 04-15-19 with Dr Crenshaw. ° ° °   ° ° ° ° ° °

## 2019-04-11 NOTE — Telephone Encounter (Signed)
Called to check in on patient. Discussed COVID 35 with patient yesterday, but she is socially distanced and is declining testing for COVID19 at this time. Again reiterated with her to avoid others until she meets criteria for ending isolation after any suspected COVID, which are:  -3 days with no fever and  -Respiratory symptoms have improved (e.g. cough, shortness of breath) -10 days since symptoms first appeared  -she is to call us if she decides to be tested or go to an off-site testing facility -Counseled on wearing a mask, washing hands and avoiding social gatherings  -ED precautions discussed and patient expressed good understanding   Patient voiced understanding and thanked me for checking in on her again today.  [place] Theone Bowell, DO PGY-2, [facility]

## 2019-04-15 ENCOUNTER — Ambulatory Visit (INDEPENDENT_AMBULATORY_CARE_PROVIDER_SITE_OTHER): Payer: PPO | Admitting: Cardiology

## 2019-04-15 ENCOUNTER — Other Ambulatory Visit: Payer: Self-pay

## 2019-04-15 ENCOUNTER — Encounter: Payer: Self-pay | Admitting: Cardiology

## 2019-04-15 VITALS — BP 142/78 | HR 71 | Temp 97.9°F | Ht 67.0 in | Wt 160.0 lb

## 2019-04-15 DIAGNOSIS — I10 Essential (primary) hypertension: Secondary | ICD-10-CM

## 2019-04-15 DIAGNOSIS — R072 Precordial pain: Secondary | ICD-10-CM

## 2019-04-15 DIAGNOSIS — I5032 Chronic diastolic (congestive) heart failure: Secondary | ICD-10-CM

## 2019-04-15 DIAGNOSIS — Z720 Tobacco use: Secondary | ICD-10-CM

## 2019-04-15 DIAGNOSIS — E7800 Pure hypercholesterolemia, unspecified: Secondary | ICD-10-CM | POA: Diagnosis not present

## 2019-04-15 NOTE — Patient Instructions (Signed)
Medication Instructions:  NO CHANGE If you need a refill on your cardiac medications before your next appointment, please call your pharmacy.   Lab work: Your physician recommends that you return for lab work PRIOR TO EATING If you have labs (blood work) drawn today and your tests are completely normal, you will receive your results only by: . MyChart Message (if you have MyChart) OR . A paper copy in the mail If you have any lab test that is abnormal or we need to change your treatment, we will call you to review the results.  Follow-Up: At [facility], you and your health needs are our priority.  As part of our continuing mission to provide you with exceptional heart care, we have created designated Provider Care Teams.  These Care Teams include your primary Cardiologist (physician) and Advanced Practice Providers (APPs -  Physician Assistants and Nurse Practitioners) who all work together to provide you with the care you need, when you need it. You will need a follow up appointment in 12 months.  Please call our office 2 months in advance to schedule this appointment.  You may see Brian Crenshaw, MD or one of the following Advanced Practice Providers on your designated Care Team:   Luke Kilroy, PA-C Krista Kroeger, PA-C . Callie Goodrich, PA-C     

## 2019-04-19 ENCOUNTER — Other Ambulatory Visit: Payer: PPO | Admitting: *Deleted

## 2019-04-19 ENCOUNTER — Other Ambulatory Visit: Payer: Self-pay

## 2019-04-19 DIAGNOSIS — E7800 Pure hypercholesterolemia, unspecified: Secondary | ICD-10-CM | POA: Diagnosis not present

## 2019-04-19 LAB — COMPREHENSIVE METABOLIC PANEL
ALT: 11 IU/L (ref 0–32)
AST: 19 IU/L (ref 0–40)
Albumin/Globulin Ratio: 1.7 (ref 1.2–2.2)
Albumin: 3.8 g/dL (ref 3.6–4.6)
Alkaline Phosphatase: 73 IU/L (ref 39–117)
BUN/Creatinine Ratio: 24 (ref 12–28)
BUN: 28 mg/dL — ABNORMAL HIGH (ref 8–27)
Bilirubin Total: 0.3 mg/dL (ref 0.0–1.2)
CO2: 24 mmol/L (ref 20–29)
Calcium: 9.2 mg/dL (ref 8.7–10.3)
Chloride: 105 mmol/L (ref 96–106)
Creatinine, Ser: 1.15 mg/dL — ABNORMAL HIGH (ref 0.57–1.00)
GFR calc Af Amer: 49 mL/min/{1.73_m2} — ABNORMAL LOW (ref >59)
GFR calc non Af Amer: 43 mL/min/{1.73_m2} — ABNORMAL LOW (ref >59)
Globulin, Total: 2.3 g/dL (ref 1.5–4.5)
Glucose: 88 mg/dL (ref 65–99)
Potassium: 4.6 mmol/L (ref 3.5–5.2)
Sodium: 140 mmol/L (ref 134–144)
Total Protein: 6.1 g/dL (ref 6.0–8.5)

## 2019-04-19 LAB — LIPID PANEL
Chol/HDL Ratio: 4.6 ratio — ABNORMAL HIGH (ref 0.0–4.4)
Cholesterol, Total: 253 mg/dL — ABNORMAL HIGH (ref 100–199)
HDL: 55 mg/dL (ref >39)
LDL Calculated: 182 mg/dL — ABNORMAL HIGH (ref 0–99)
Triglycerides: 82 mg/dL (ref 0–149)
VLDL Cholesterol Cal: 16 mg/dL (ref 5–40)

## 2019-04-23 ENCOUNTER — Telehealth: Payer: Self-pay | Admitting: *Deleted

## 2019-04-23 DIAGNOSIS — E7800 Pure hypercholesterolemia, unspecified: Secondary | ICD-10-CM

## 2019-04-23 NOTE — Telephone Encounter (Addendum)
-----   Message from Lelon Perla, MD sent at 04/19/2019  4:50 PM EDT ----- Is pt taking [place] with pt, she was not taking the lipitor when this blood work was drawn. She was worried about long term effects of the medication. She will restart the atorvastatin 80 mg once daily and then recheck labs in 8 weeks.

## 2019-05-03 ENCOUNTER — Other Ambulatory Visit: Payer: Self-pay | Admitting: Family Medicine

## 2019-05-13 ENCOUNTER — Other Ambulatory Visit: Payer: Self-pay

## 2019-05-13 ENCOUNTER — Ambulatory Visit (INDEPENDENT_AMBULATORY_CARE_PROVIDER_SITE_OTHER): Payer: PPO | Admitting: Family Medicine

## 2019-05-13 ENCOUNTER — Encounter: Payer: Self-pay | Admitting: Family Medicine

## 2019-05-13 VITALS — BP 128/78 | HR 54

## 2019-05-13 DIAGNOSIS — K2100 Gastro-esophageal reflux disease with esophagitis, without bleeding: Secondary | ICD-10-CM

## 2019-05-13 DIAGNOSIS — M810 Age-related osteoporosis without current pathological fracture: Secondary | ICD-10-CM | POA: Diagnosis not present

## 2019-05-13 DIAGNOSIS — K5900 Constipation, unspecified: Secondary | ICD-10-CM | POA: Diagnosis not present

## 2019-05-13 MED ORDER — POLYETHYLENE GLYCOL 3350 17 GM/SCOOP PO POWD: 17.0000 g | Freq: Every day | ORAL | 0 refills | Status: AC

## 2019-05-13 MED ORDER — FAMOTIDINE 20 MG PO TABS: 20.0000 mg | ORAL_TABLET | Freq: Every day | ORAL | 0 refills | Status: DC

## 2019-05-13 NOTE — Patient Instructions (Addendum)
It was a pleasure to meet you today.   I will prescribe some Pepcid to take daily for heartburn. Increase fiber intake to help with constipation.  I will also order you some MiraLAX to take daily for 7 days.  Should symptoms worsen please call the clinic to schedule an earlier appointment. Stop taking alendronate until symptoms of heartburn resolved.  Follow-up with PCP in 1 week

## 2019-05-14 ENCOUNTER — Other Ambulatory Visit: Payer: Self-pay | Admitting: *Deleted

## 2019-05-14 DIAGNOSIS — K5900 Constipation, unspecified: Secondary | ICD-10-CM | POA: Insufficient documentation

## 2019-05-14 NOTE — Assessment & Plan Note (Signed)
Pepcid 20 mg daily  Stop Actonel until symptoms resolve

## 2019-05-14 NOTE — Progress Notes (Addendum)
  Patient Name: Cheryl Anderson Date of Birth: 04-Aug-1929 Date of Visit: 05/14/19 PCP: Carollee Leitz, MD  Chief Complaint:   Subjective: Cheryl Anderson is a pleasant 85 y.o. with medical history significant for HTN, Tobacco Use, CAD, GERD, CHF  presenting today for mid chest discomfort that radiates to back. She reports having similar episodes which was resolved with Nexium.  She states that she feels like food gets stuck in the centre of her chest.  She denies any nausea or vomiting, no loss in weight, no abdominal pani, no diarrhea.  She does report having constipation that is relieved with suppositories and that also relieves the chest discomfort.  She takes Actonel weekly but is unsure if this makes her discomfort worse.    ROS: Per HPI.  Review of Systems  Constitutional: Negative for malaise/fatigue and weight loss.  Cardiovascular: Negative for chest pain and palpitations.  Gastrointestinal: Positive for constipation. Negative for abdominal pain, diarrhea and vomiting.  Musculoskeletal: Negative for back pain, joint pain and myalgias.  Neurological: Negative for weakness.  I have reviewed the patient's medical, surgical, family, and social history as appropriate.  Vitals:   05/13/19 1059  BP: 128/78  Pulse: (!) 54  SpO2: 99%   General: Alert and oriented, no apparent distress  Eyes: PEERLA ENTM: mucus membranes moist Neck: nontender Cardiovascular: RRR with no murmurs noted, no reproducible pain Respiratory: CTA bilaterally  Gastrointestinal: Bowel sounds present. No abdominal pain, soft, non distended MSK: Upper extremity strength 5/5 bilaterally, Lower extremity strength 5/5 bilaterally  Derm: No rashes noted Neuro: CN II-XII Psych: Behavior and speech appropriate to situation   GASTROESOPHAGEAL REFLUX, NO ESOPHAGITIS Pepcid 20 mg daily  Stop Actonel until symptoms resolve  Constipation Miralax daily Increase fiber intake    Return to care in 1 week with PCP    Carollee Leitz, MD  [facility] Medicine Teaching Service

## 2019-05-14 NOTE — Assessment & Plan Note (Signed)
Miralax daily Increase fiber intake

## 2019-05-15 NOTE — Telephone Encounter (Signed)
[facility] Telemedicine Visit  Patient consented to have virtual visit. Method of visit: Telephone  Encounter participants: Patient: Cheryl Anderson - located at home Provider: Matilde Haymaker - located at [facility] Others (if applicable): None  Chief Complaint: Back pain  HPI:  Back pain Had pain for 2 weeks. Was seen in clinic on Monday and given miralax. Pain between shoulder blades. Sharp pain 9/10 for the past 2 weeks. Not a constant pain, it comes and goes. Sometimes the underside of her left arm with hurts too. Pain is worse with laying down. Pt sleeps on side.  Not related to exertion or positional changes.  Not related to eating.  She has no bowel/bladder incontinence no saddle anesthesias.  Chest pain While seated at the table for dinner, she noticed an epigastric burning sensation without radiation.  She took 1 nitroglycerin sublingual tablet and noticed significant relief.  Since that episode, she has moved around the house without any recurrence of the pain with exertion.  She has not required any additional nitroglycerin.  Prior to today, her last use of nitroglycerin was several months ago.  ROS: per HPI  Pertinent PMHx: GERD, CAD, heart failure  Exam:  Respiratory: Breathing comfortably.  Able to complete long sentences without effort  Assessment/Plan:  Back pain She is advised that she does not need to be seen in the emergency room tonight for this issue.  This sounds like an ongoing issue that may be mildly worsened in the past several days.  Seems likely to be a muscular skeletal issue without acute trauma.  She is advised that there is no clear indication for to be assessed tonight.  She was advised to follow-up with her PCP to discuss this issue in clinic.   Chest pain Transient nature.  Improved with SL nitroglycerin x1.  Not worsened by exertion.  Possibly due to cardiac etiology though lower suspicion because it did not worsen with  exertion.  Overall, she did not seem concerned about her chest pain and only mentioned in passing.  She was told she does not need to be assessed in the hospital this evening.  She was told that she should be seen in the hospital immediately if she has chest pain that does not improve with nitroglycerin x3.  She can follow-up to discuss with her PCP.  Time spent during visit with patient: 15 minutes

## 2019-05-16 ENCOUNTER — Other Ambulatory Visit: Payer: Self-pay

## 2019-05-16 MED ORDER — FUROSEMIDE 40 MG PO TABS: 40.0000 mg | ORAL_TABLET | Freq: Every day | ORAL | 3 refills | Status: DC

## 2019-05-22 ENCOUNTER — Ambulatory Visit
Admission: RE | Admit: 2019-05-22 | Discharge: 2019-05-22 | Disposition: A | Discharge status: Home / Self Care | Payer: PPO | Source: Ambulatory Visit | Attending: Family Medicine | Admitting: Family Medicine

## 2019-05-22 ENCOUNTER — Other Ambulatory Visit: Payer: Self-pay

## 2019-05-22 ENCOUNTER — Other Ambulatory Visit: Payer: Self-pay | Admitting: Family Medicine

## 2019-05-22 ENCOUNTER — Ambulatory Visit (INDEPENDENT_AMBULATORY_CARE_PROVIDER_SITE_OTHER): Payer: PPO | Admitting: Family Medicine

## 2019-05-22 ENCOUNTER — Encounter: Payer: Self-pay | Admitting: Family Medicine

## 2019-05-22 VITALS — BP 155/80 | HR 66 | Temp 97.6°F | Wt 160.0 lb

## 2019-05-22 DIAGNOSIS — K2100 Gastro-esophageal reflux disease with esophagitis, without bleeding: Secondary | ICD-10-CM

## 2019-05-22 DIAGNOSIS — M940 Chondrocostal junction syndrome [Tietze]: Secondary | ICD-10-CM

## 2019-05-22 DIAGNOSIS — M546 Pain in thoracic spine: Secondary | ICD-10-CM

## 2019-05-22 DIAGNOSIS — S2241XD Multiple fractures of ribs, right side, subsequent encounter for fracture with routine healing: Secondary | ICD-10-CM | POA: Diagnosis not present

## 2019-05-22 DIAGNOSIS — M19012 Primary osteoarthritis, left shoulder: Secondary | ICD-10-CM | POA: Diagnosis not present

## 2019-05-22 DIAGNOSIS — M549 Dorsalgia, unspecified: Secondary | ICD-10-CM | POA: Diagnosis not present

## 2019-05-22 DIAGNOSIS — M25512 Pain in left shoulder: Secondary | ICD-10-CM | POA: Diagnosis not present

## 2019-05-22 MED ORDER — DICLOFENAC SODIUM 1 % TD GEL: 2.0000 g | Freq: Four times a day (QID) | TRANSDERMAL | 1 refills | Status: DC

## 2019-05-22 MED ORDER — LIDOCAINE 5 % EX PTCH: 1.0000 | MEDICATED_PATCH | Freq: Two times a day (BID) | CUTANEOUS | 0 refills | Status: AC

## 2019-05-22 MED ORDER — FUROSEMIDE 40 MG PO TABS: 40.0000 mg | ORAL_TABLET | Freq: Every day | ORAL | 3 refills | Status: DC

## 2019-05-22 MED ORDER — FAMOTIDINE 20 MG PO TABS: 20.0000 mg | ORAL_TABLET | Freq: Every day | ORAL | 3 refills | Status: DC

## 2019-05-22 NOTE — Patient Instructions (Addendum)
It was a pleasure seeing you today,  For your back pain we will get an x-ray of your shoulder back.  Once results are back I will call you and discuss.  We will also try diclofenac gel to the affected area.  I will also order you a lidocaine patch to try if the gel is not effective.  If you experience worsening pain, shortness of breath, chest pain please call emergency line or follow-up in the ED to seek medical attention.  You can continue your Actonel.  If you have recurrent sensations of food being stuck please stop the medication and we will discuss at her next visit.  I encourage you to consider quit smoking.  If you have any questions concerning options to help guide you please feel free to let me know and I will provide you with the materials needed.  Please check your blood pressure at different times of the next couple of days and we will discuss the results at our next visit.  Follow up in 2 days with virtual visit at 2:45pm  Carollee Leitz MD

## 2019-05-24 ENCOUNTER — Telehealth (INDEPENDENT_AMBULATORY_CARE_PROVIDER_SITE_OTHER): Payer: PPO | Admitting: Family Medicine

## 2019-05-24 ENCOUNTER — Other Ambulatory Visit: Payer: Self-pay

## 2019-05-24 DIAGNOSIS — M546 Pain in thoracic spine: Secondary | ICD-10-CM | POA: Diagnosis not present

## 2019-05-24 NOTE — Progress Notes (Signed)
  Patient Name: Cheryl Anderson Date of Birth: Jul 05, 1929 Date of Visit: 05/22/2019 PCP: Carollee Leitz, MD  Chief Complaint: Follow-up from last appointment  Subjective: Cheryl Anderson is a pleasant 85 y.o. with medical history significant for hypertension, CHF, GERD, arthritis, constipation presenting today for follow-up from previous appointment for resolution of GERD.  GERD resolved Patient reports feeling better since starting on the Pepcid last week.  She has no epigastric pain now.  No discomfort when swallowing food.  She had stopped her Actonel as directed for a week.  Symptoms have now resolved.  Back pain She complains of mid upper back pain that radiates to the left side as well as left shoulder pain.  She says the pain wakes her at night.  It is relieved when sitting up for about 5 to 10 minutes.  A week ago she self administered with nitroglycerin for which the pain did disappear.  Pain is nonexertional.  She denies any chest pain, shortness of breath.  Pain is sporadic but does mention worse at night.  Constipation Resolved with MiraLAX.  Hypertension Blood pressure today 155/80.  Patient reports she did not take her medications today.  She reports normally it is well controlled on amlodipine and Altace.   ROS: Per HPI.   I have reviewed the patient's medical, surgical, family, and social history as appropriate.  Vitals:   05/22/19 1347  BP: (!) 155/80  Pulse: 66  Temp: 97.6 F (36.4 C)  SpO2: 99%    General: Alert and oriented, no apparent distress Cardiovascular: RRR with no murmurs noted Respiratory: CTA bilaterally  MSK: Upper extremity strength 5/5 bilaterally, tenderness along the upper thoracic vertebral column, pain also elicited on palpation of the left shoulder, good flexion and extension of the left shoulder.  No erythema, swelling noted.    Back pain Pain is reproducible on palpation of left posterior upper thoracic region as well as the left  shoulder joint. Will obtain x-ray of left shoulder and x-ray of left ribs. Diclofenac gel as directed. Lidocaine patch as needed. Follow-up in 2 days.  GERD Continue Pepcid 20 mg daily. Restart bisphosphonate and educated to remain sitting in upright position for 30 minutes after ingestion of medication. Advised not to eat at least 1 hour prior to going to bed, and encouraged tobacco cessation.  Hypertension We will have patient recheck blood pressures at home and will discuss at next visit considering patient had not taken her blood pressure medication today.  Return to care in 2 days for virtual visit.  Carollee Leitz, MD  Family Medicine Teaching Service

## 2019-05-25 DIAGNOSIS — M549 Dorsalgia, unspecified: Secondary | ICD-10-CM | POA: Insufficient documentation

## 2019-05-25 NOTE — Assessment & Plan Note (Signed)
Continue Pepcid 20 mg daily. Restart bisphosphonate and educated to remain sitting in upright position for 30 minutes after ingestion of medication. Advised not to eat at least an hour prior to going to bed, encouraged tobacco cessation.

## 2019-05-25 NOTE — Progress Notes (Signed)
Virtual Visit via Telephone Note  I connected with Cheryl Anderson on 05/24/2019 at  2:45 PM EDT by telephone and verified that I am speaking with the correct person using two identifiers.  Location: Patient: at home Provider: at office   I discussed the limitations, risks, security and privacy concerns of performing an evaluation and management service by telephone and the availability of in person appointments. I also discussed with the patient that there may be a patient responsible charge related to this service. The patient expressed understanding and agreed to proceed.   History of Present Illness: Patient was seen in clinic 2 days ago for left upper thoracic and shoulder pain.  She was prescribed diclofenac gel for pain as well as a lidocaine patch.  Patient says that she did not use the patch but did use the the gel with successful results.  Last night was her first night that she slept throughout the night without any pain.   Observations/Objective: Patient able to talk in full sentences sentences, does not appear to be in any distress. No exam at this visit as it is a telephone visit.  Assessment and Plan: Continue using diclofenac gel as needed but using sparingly. X-rays reviewed of left shoulder and left ribs, no fractures or displacements reported.  Follow Up Instructions:    I discussed the assessment and treatment plan with the patient. The patient was provided an opportunity to ask questions and all were answered. The patient agreed with the plan and demonstrated an understanding of the instructions.   The patient was advised to call back or seek an in-person evaluation if the symptoms worsen or if the condition fails to improve as anticipated.  I provided 6 minutes of non-face-to-face time during this encounter.   Carollee Leitz, MD

## 2019-05-25 NOTE — Assessment & Plan Note (Deleted)
Pain is producible on palpation of the left posterior upper thoracic region as well as along the left shoulder joint. We will obtain x-ray of left shoulder and x-ray of left ribs. Diclofenac gel as directed. Lidocaine patch as needed. Follow-up in 2 days.

## 2019-05-25 NOTE — Assessment & Plan Note (Addendum)
Pain is producible on palpation of left posterior upper thoracic region as well as along the left shoulder joint. We will obtain x-ray of left shoulder and x-ray of left ribs. Diclofenac gel as directed. Lidocaine patch as needed. Follow-up in 2 days.

## 2019-06-04 ENCOUNTER — Other Ambulatory Visit: Payer: Self-pay | Admitting: Family Medicine

## 2019-07-02 ENCOUNTER — Other Ambulatory Visit: Payer: Self-pay | Admitting: Family Medicine

## 2019-07-02 DIAGNOSIS — H26491 Other secondary cataract, right eye: Secondary | ICD-10-CM | POA: Diagnosis not present

## 2019-07-02 DIAGNOSIS — H40213 Acute angle-closure glaucoma, bilateral: Secondary | ICD-10-CM | POA: Diagnosis not present

## 2019-07-02 DIAGNOSIS — H1851 Endothelial corneal dystrophy: Secondary | ICD-10-CM | POA: Diagnosis not present

## 2019-07-02 DIAGNOSIS — Z961 Presence of intraocular lens: Secondary | ICD-10-CM | POA: Diagnosis not present

## 2019-07-12 ENCOUNTER — Other Ambulatory Visit: Payer: Self-pay

## 2019-07-12 ENCOUNTER — Ambulatory Visit (INDEPENDENT_AMBULATORY_CARE_PROVIDER_SITE_OTHER): Payer: PPO

## 2019-07-12 DIAGNOSIS — Z23 Encounter for immunization: Secondary | ICD-10-CM

## 2019-07-12 NOTE — Progress Notes (Signed)
Patient presents in nurse clinic for Flu vaccine. Vaccine given in, LD. Patient tolerated well.

## 2019-07-16 ENCOUNTER — Telehealth: Payer: Self-pay

## 2019-07-16 NOTE — Telephone Encounter (Signed)
Request for surgical clearance:  1. What type of surgery is being performed? EXTRACTION OF SEVERAL TEETH AND SMOOTHING OF THE BONE UNDER   2. When is this surgery scheduled? TBD   3. What type of clearance is required (medical clearance vs. Pharmacy clearance to hold med vs. Both)? NOT LISTED  4. Are there any medications that need to be held prior to surgery and how long?NOT LISTED   5. Practice name and name of physician performing surgery? [facility]  6. What is your office phone number  [phone]   7.   What is your office fax number  [phone]  8.   Anesthesia type (None, local, MAC, general) ?  MODERATE CONSCIOUS SEDATION

## 2019-07-16 NOTE — Telephone Encounter (Signed)
   Primary Cardiologist: Kirk Ruths, MD  Chart reviewed as part of pre-operative protocol coverage. Patient was contacted 07/16/2019 in reference to pre-operative risk assessment for pending surgery as outlined below.  Cheryl Anderson was last seen on 04/15/2019 by Dr. Stanford Breed. Lauretta Grill was doing well at that time from a cardiac perspective. She had no anginal symptoms, no SOB or HF symptoms. She has no prior hx of CAD however is on ASA therapy for prevention. Per pre-op form, dental team has not requested holding recommendations for ASA. If simple dental extraction, there is generally no need to hold ASA therapy. If greater than 2 extractions, would hold prior to procedure. If further recommendations are needed, please re-submit form for review.   Therefore, based on ACC/AHA guidelines, the patient would be at acceptable risk for the planned procedure without further cardiovascular testing.   I will route this recommendation to the requesting party via Epic fax function and remove from pre-op pool.  Please call with questions.  Kathyrn Drown, NP 07/16/2019, 2:17 PM

## 2019-07-23 DIAGNOSIS — H40213 Acute angle-closure glaucoma, bilateral: Secondary | ICD-10-CM | POA: Diagnosis not present

## 2019-07-23 DIAGNOSIS — H18519 Endothelial corneal dystrophy, unspecified eye: Secondary | ICD-10-CM | POA: Diagnosis not present

## 2019-07-25 ENCOUNTER — Other Ambulatory Visit: Payer: Self-pay | Admitting: Family Medicine

## 2019-08-12 ENCOUNTER — Other Ambulatory Visit: Payer: Self-pay | Admitting: *Deleted

## 2019-08-12 DIAGNOSIS — Z9109 Other allergy status, other than to drugs and biological substances: Secondary | ICD-10-CM

## 2019-08-14 MED ORDER — LORATADINE 10 MG PO TABS: 10.0000 mg | ORAL_TABLET | Freq: Every day | ORAL | 3 refills | Status: DC

## 2019-08-27 ENCOUNTER — Other Ambulatory Visit: Payer: Self-pay | Admitting: Family Medicine

## 2019-09-03 ENCOUNTER — Other Ambulatory Visit: Payer: Self-pay | Admitting: Family Medicine

## 2019-12-20 ENCOUNTER — Encounter: Payer: Self-pay | Admitting: Family Medicine

## 2019-12-20 ENCOUNTER — Other Ambulatory Visit: Payer: Self-pay

## 2019-12-20 ENCOUNTER — Ambulatory Visit (INDEPENDENT_AMBULATORY_CARE_PROVIDER_SITE_OTHER): Payer: Medicare Other | Admitting: Family Medicine

## 2019-12-20 VITALS — BP 158/72 | HR 61 | Wt 154.0 lb

## 2019-12-20 DIAGNOSIS — R600 Localized edema: Secondary | ICD-10-CM

## 2019-12-20 DIAGNOSIS — H9190 Unspecified hearing loss, unspecified ear: Secondary | ICD-10-CM | POA: Diagnosis not present

## 2019-12-20 MED ORDER — DICLOFENAC SODIUM 1 % EX GEL: 4.0000 g | Freq: Four times a day (QID) | CUTANEOUS | 1 refills | Status: DC

## 2019-12-20 NOTE — Patient Instructions (Addendum)
It was a pleasure seeing you today.  You were seen for lower extremity swelling.  I have ordered Diclofenac gel to apply to your legs 4 times a day. I also ordered Compression stockings to use daily.  You will get blood work done today  I will call you if there is anything abnormal.  If you do not hear from me we can discuss the results at your next visit.  I have also sent a referral to audiology to have your hearing checked.    I have booked a follow up appointment for March 31 at 345pm  Hope you have a good weekend   See you at your next appointment.  Cheryl Leitz MD   Chronic Venous Insufficiency Chronic venous insufficiency is a condition where the leg veins cannot effectively pump blood from the legs to the heart. This happens when the vein walls are either stretched, weakened, or damaged, or when the valves inside the vein are damaged. With the right treatment, you should be able to continue with an active life. This condition is also called venous stasis. What are the causes? Common causes of this condition include:  High blood pressure inside the veins (venous hypertension).  Sitting or standing too long, causing increased blood pressure in the leg veins.  A blood clot that blocks blood flow in a vein (deep vein thrombosis, DVT).  Inflammation of a vein (phlebitis) that causes a blood clot to form.  Tumors in the pelvis that cause blood to back up. What increases the risk? The following factors may make you more likely to develop this condition:  Having a family history of this condition.  Obesity.  Pregnancy.  Living without enough regular physical activity or exercise (sedentary lifestyle).  Smoking.  Having a job that requires long periods of standing or sitting in one place.  Being a certain age. Women in their 79s and 106s and men in their 63s are more likely to develop this condition. What are the signs or symptoms? Symptoms of this condition  include:  Veins that are enlarged, bulging, or twisted (varicose veins).  Skin breakdown or ulcers.  Reddened skin or dark discoloration of skin on the leg between the knee and ankle.  Brown, smooth, tight, and painful skin just above the ankle, usually on the inside of the leg (lipodermatosclerosis).  Swelling of the legs. How is this diagnosed? This condition may be diagnosed based on:  Your medical history.  A physical exam.  Tests, such as: ? A procedure that creates an image of a blood vessel and nearby organs and provides information about blood flow through the blood vessel (duplex ultrasound). ? A procedure that tests blood flow (plethysmography). ? A procedure that looks at the veins using X-ray and dye (venogram). How is this treated? The goals of treatment are to help you return to an active life and to minimize pain or disability. Treatment depends on the severity of your condition, and it may include:  Wearing compression stockings. These can help relieve symptoms and help prevent your condition from getting worse. However, they do not cure the condition.  Sclerotherapy. This procedure involves an injection of a solution that shrinks damaged veins.  Surgery. This may involve: ? Removing a diseased vein (vein stripping). ? Cutting off blood flow through the vein (laser ablation surgery). ? Repairing or reconstructing a valve within the affected vein. Follow these instructions at home:      Wear compression stockings as told by your health  care provider. These stockings help to prevent blood clots and reduce swelling in your legs.  Take over-the-counter and prescription medicines only as told by your health care provider.  Stay active by exercising, walking, or doing different activities. Ask your health care provider what activities are safe for you and how much exercise you need.  Drink enough fluid to keep your urine pale yellow.  Do not use any products  that contain nicotine or tobacco, such as cigarettes, e-cigarettes, and chewing tobacco. If you need help quitting, ask your health care provider.  Keep all follow-up visits as told by your health care provider. This is important. Contact a health care provider if you:  Have redness, swelling, or more pain in the affected area.  See a red streak or line that goes up or down from the affected area.  Have skin breakdown or skin loss in the affected area, even if the breakdown is small.  Get an injury in the affected area. Get help right away if:  You get an injury and an open wound in the affected area.  You have: ? Severe pain that does not get better with medicine. ? Sudden numbness or weakness in the foot or ankle below the affected area. ? Trouble moving your foot or ankle. ? A fever. ? Worse or persistent symptoms. ? Chest pain. ? Shortness of breath. Summary  Chronic venous insufficiency is a condition where the leg veins cannot effectively pump blood from the legs to the heart.  Chronic venous insufficiency occurs when the vein walls become stretched, weakened, or damaged, or when valves within the vein are damaged.  Treatment depends on how severe your condition is. It often involves wearing compression stockings and may involve having a procedure.  Make sure you stay active by exercising, walking, or doing different activities. Ask your health care provider what activities are safe for you and how much exercise you need. This information is not intended to replace advice given to you by your health care provider. Make sure you discuss any questions you have with your health care provider. Document Revised: 06/19/2018 Document Reviewed: 06/19/2018 Elsevier Patient Education  2020 [employer].

## 2019-12-20 NOTE — Progress Notes (Signed)
  Patient Name: Cheryl Anderson Date of Birth: [birth year] Date of Visit: 12/20/19 PCP: Dana Allan, MD  Chief Complaint:  Leg swelling and burning sensation  Subjective: Cheryl Anderson is a pleasant 86 y.o. with medical history significant for HTN, CAD, presenting today for bilateral leg swelling and decreased hearing.   Chronic Leg Swelling Ongoing bilateral leg swelling for the past year.  Feels like getting worse although reports swelling has decreased today.  She reports night time awakenings with burning sensation mostly to Right lateral side of lower extremity. Nothing makes it worse or better.  She reports having a mechanical fall about 1 year ago with a fracture to her foot and since then she's has had swelling.  Denies any recent trauma, no chest pain or shortness of breath.  Voiding well and is currently on Lasix. Denies any decrease in sensation, numbness or tingling. Reports no difficulty walking or changes in gait.      Hearing Daughter requesting hearing exam.  She feels like when she speaks to her mother it take a while for her to answer. Due to lack of time this visit it was mutually agreed to further investigate at next visit.  Until then I have book a referral to Audiology. Exam deferred until next visit. ROS: Per HPI.   I have reviewed the patient's medical, surgical, family, and social history as appropriate.  Vitals:   12/20/19 1404  BP: (!) 158/72  Pulse: 61  SpO2: 99%   General: Alert and oriented, no apparent distress  Cardiovascular: RRR with no murmurs noted Respiratory: CTA bilaterally  MSK: Lower extremity strength 5/5 bilaterally, trace non pitting edema bilaterally up to ankles, ROM normal, tenderness to palpation to right lateral side of calf Neuro: motor and sensory intact, gait normal    Bilateral lower extremity edema Given the chronicity of bilateral lower extremity edema, burning sensations and nighttime awakenings high suspicion for venous  insufficiency.  Considered  CHF as patient has history, but given no increased SOB and lung exam benign and adequate urinary output with current Lasix dosing, seems less likely.  Also considered DVT but given bilateral edema and not unilateral and distal pulses present seems less likely.   -Diclofenac gel QID to affected area -BMP -CBC -HbA1c -TSH -Urinalysis -Compression stockings   Hearing decrease Patient not aware of decrease in hearing.  Daughter feels like hearing has decreased.  Suspect decrease in hearing likely secondary to aging.  Due to limited time during visit unable to perform detailed history and assessment.  -Amb referral to audiology -scheduled for follow up visit March 31 for follow up  RTC in 3 weeks or sooner if needed  Dana Allan, MD  [facility] Medicine Teaching Service

## 2019-12-20 NOTE — Assessment & Plan Note (Addendum)
Given the chronicity of bilateral lower extremity edema, burning sensations and nighttime awakenings high suspicion for venous insufficiency.  Considered  CHF as patient has history, but given no increased SOB and lung exam benign and adequate urinary output with current Lasix dosing, seems less likely.  Also considered DVT but given bilateral edema and not unilateral and distal pulses present seems less likely.   -Diclofenac gel QID to affected area -BMP -CBC -HbA1c -TSH -Urinalysis -Compression stockings

## 2019-12-21 LAB — BASIC METABOLIC PANEL
BUN/Creatinine Ratio: 14 (ref 12–28)
BUN: 16 mg/dL (ref 8–27)
CO2: 25 mmol/L (ref 20–29)
Calcium: 9.1 mg/dL (ref 8.7–10.3)
Chloride: 105 mmol/L (ref 96–106)
Creatinine, Ser: 1.12 mg/dL — ABNORMAL HIGH (ref 0.57–1.00)
GFR calc Af Amer: 50 mL/min/{1.73_m2} — ABNORMAL LOW (ref >59)
GFR calc non Af Amer: 44 mL/min/{1.73_m2} — ABNORMAL LOW (ref >59)
Glucose: 86 mg/dL (ref 65–99)
Potassium: 4.5 mmol/L (ref 3.5–5.2)
Sodium: 143 mmol/L (ref 134–144)

## 2019-12-21 LAB — TSH: TSH: 1.79 u[IU]/mL (ref 0.450–4.500)

## 2019-12-21 LAB — HEMOGLOBIN A1C
Est. average glucose Bld gHb Est-mCnc: 108 mg/dL
Hgb A1c MFr Bld: 5.4 % (ref 4.8–5.6)

## 2019-12-21 LAB — CBC
Hematocrit: 39.6 % (ref 34.0–46.6)
Hemoglobin: 13.1 g/dL (ref 11.1–15.9)
MCH: 30.3 pg (ref 26.6–33.0)
MCHC: 33.1 g/dL (ref 31.5–35.7)
MCV: 92 fL (ref 79–97)
Platelets: 292 10*3/uL (ref 150–450)
RBC: 4.32 x10E6/uL (ref 3.77–5.28)
RDW: 12.7 % (ref 11.7–15.4)
WBC: 5.9 10*3/uL (ref 3.4–10.8)

## 2020-01-08 ENCOUNTER — Ambulatory Visit: Payer: Medicare Other | Admitting: Family Medicine

## 2020-01-31 ENCOUNTER — Other Ambulatory Visit: Payer: Self-pay

## 2020-01-31 ENCOUNTER — Ambulatory Visit (INDEPENDENT_AMBULATORY_CARE_PROVIDER_SITE_OTHER): Payer: Medicare Other | Admitting: Family Medicine

## 2020-01-31 VITALS — BP 145/80 | HR 58 | Ht 66.93 in | Wt 153.4 lb

## 2020-01-31 DIAGNOSIS — Z9109 Other allergy status, other than to drugs and biological substances: Secondary | ICD-10-CM

## 2020-01-31 DIAGNOSIS — I10 Essential (primary) hypertension: Secondary | ICD-10-CM | POA: Diagnosis not present

## 2020-01-31 DIAGNOSIS — R609 Edema, unspecified: Secondary | ICD-10-CM

## 2020-01-31 DIAGNOSIS — E785 Hyperlipidemia, unspecified: Secondary | ICD-10-CM | POA: Diagnosis not present

## 2020-01-31 MED ORDER — FUROSEMIDE 40 MG PO TABS: 40.0000 mg | ORAL_TABLET | Freq: Every day | ORAL | 1 refills | Status: DC

## 2020-01-31 MED ORDER — RAMIPRIL 5 MG PO CAPS: 5.0000 mg | ORAL_CAPSULE | Freq: Every day | ORAL | 1 refills | Status: DC

## 2020-01-31 MED ORDER — ATORVASTATIN CALCIUM 80 MG PO TABS: 80.0000 mg | ORAL_TABLET | Freq: Every day | ORAL | 1 refills | Status: DC

## 2020-01-31 MED ORDER — ISOSORBIDE MONONITRATE ER 30 MG PO TB24: 30.0000 mg | ORAL_TABLET | Freq: Every day | ORAL | 1 refills | Status: DC

## 2020-01-31 MED ORDER — LORATADINE 10 MG PO TABS: 10.0000 mg | ORAL_TABLET | Freq: Every day | ORAL | 3 refills | Status: DC

## 2020-01-31 NOTE — Patient Instructions (Addendum)
It was a pleasure seeing you today.    You were seen for checkup today.  Your blood pressure was 180/80.  Recheck was  I have ordered blood work to check your cholesterol levels and will notify you if any changes need to be made to your medication.  I have also ordered compression stockings for you.  You can go to any health supply store they will fit you for them.  If you have any questions or concerns please call the clinic for assistance.  I have also refilled your medications that you have requested.  Please call your pharmacy to see when they will will be ready for pickup.  Follow-up in clinic as needed  Have a great day  Carollee Leitz, MD   Smoking Tobacco Information, Adult Smoking tobacco can be harmful to your health. Tobacco contains a poisonous (toxic), colorless chemical called nicotine. Nicotine is addictive. It changes the brain and can make it hard to stop smoking. Tobacco also has other toxic chemicals that can hurt your body and raise your risk of many cancers. How can smoking tobacco affect me? Smoking tobacco puts you at risk for:  Cancer. Smoking is most commonly associated with lung cancer, but can also lead to cancer in other parts of the body.  Chronic obstructive pulmonary disease (COPD). This is a long-term lung condition that makes it hard to breathe. It also gets worse over time.  High blood pressure (hypertension), heart disease, stroke, or heart attack.  Lung infections, such as pneumonia.  Cataracts. This is when the lenses in the eyes become clouded.  Digestive problems. This may include peptic ulcers, heartburn, and gastroesophageal reflux disease (GERD).  Oral health problems, such as gum disease and tooth loss.  Loss of taste and smell. Smoking can affect your appearance by causing:  Wrinkles.  Yellow or stained teeth, fingers, and fingernails. Smoking tobacco can also affect your social life, because:  It may be challenging to find places  to smoke when away from home. Many workplaces, [employer], hotels, and public places are tobacco-free.  Smoking is expensive. This is due to the cost of tobacco and the long-term costs of treating health problems from smoking.  Secondhand smoke may affect those around you. Secondhand smoke can cause lung cancer, breathing problems, and heart disease. Children of smokers have a higher risk for: ? Sudden infant death syndrome (SIDS). ? Ear infections. ? Lung infections. If you currently smoke tobacco, quitting now can help you:  Lead a longer and healthier life.  Look, smell, breathe, and feel better over time.  Save money.  Protect others from the harms of secondhand smoke. What actions can I take to prevent health problems? Quit smoking   Do not start smoking. Quit if you already do.  Make a plan to quit smoking and commit to it. Look for programs to help you and ask your health care provider for recommendations and ideas.  Set a date and write down all the reasons you want to quit.  Let your friends and family know you are quitting so they can help and support you. Consider finding friends who also want to quit. It can be easier to quit with someone else, so that you can support each other.  Talk with your health care provider about using nicotine replacement medicines to help you quit, such as gum, lozenges, patches, sprays, or pills.  Do not replace cigarette smoking with electronic cigarettes, which are commonly called e-cigarettes. The safety of e-cigarettes is  not known, and some may contain harmful chemicals.  If you try to quit but return to smoking, stay positive. It is common to slip up when you first quit, so take it one day at a time.  Be prepared for cravings. When you feel the urge to smoke, chew gum or suck on hard candy. Lifestyle  Stay busy and take care of your body.  Drink enough fluid to keep your urine pale yellow.  Get plenty of exercise and eat a  healthy diet. This can help prevent weight gain after quitting.  Monitor your eating habits. Quitting smoking can cause you to have a larger appetite than when you smoke.  Find ways to relax. Go out with friends or family to a movie or a restaurant where people do not smoke.  Ask your health care provider about having regular tests (screenings) to check for cancer. This may include blood tests, imaging tests, and other tests.  Find ways to manage your stress, such as meditation, yoga, or exercise. Where to find support To get support to quit smoking, consider:  Asking your health care provider for more information and resources.  Taking classes to learn more about quitting smoking.  Looking for local organizations that offer resources about quitting smoking.  Joining a support group for people who want to quit smoking in your local community.  Calling the smokefree.gov counselor helpline: 1-800-Quit-Now (906) 830-4255) Where to find more information You may find more information about quitting smoking from:  HelpGuide.org: www.helpguide.org  BankRights.uy: smokefree.gov  American Lung Association: www.lung.org Contact a health care provider if you:  Have problems breathing.  Notice that your lips, nose, or fingers turn blue.  Have chest pain.  Are coughing up blood.  Feel faint or you pass out.  Have other health changes that cause you to worry. Summary  Smoking tobacco can negatively affect your health, the health of those around you, your finances, and your social life.  Do not start smoking. Quit if you already do. If you need help quitting, ask your health care provider.  Think about joining a support group for people who want to quit smoking in your local community. There are many effective programs that will help you to quit this behavior. This information is not intended to replace advice given to you by your health care provider. Make sure you discuss any  questions you have with your health care provider. Document Revised: 06/21/2019 Document Reviewed: 10/11/2016 Elsevier Patient Education  2020 [employer].

## 2020-01-31 NOTE — Progress Notes (Signed)
    SUBJECTIVE:   CHIEF COMPLAINT / HPI: For medication refill.  Patient has no current complaints today.  Blood pressure controlled however she did not take her medication prior to coming to clinic today and BP was 180/80.  He denies any chest pain, shortness of breath, nausea vomiting or abdominal pain.  Continues to have lower extremity edema but is using tight knee-high stockings and has slightly improved.   PERTINENT  PMH / PSH:  Hypertension Hyperlipidemia CAD COPD CHF with preserved ejection fraction. Venous insufficiency  OBJECTIVE:   BP (!) 145/80   Pulse (!) 58   Ht 5' 6.93" (1.7 m)   Wt 153 lb 6.4 oz (69.6 kg)   BMI 24.08 kg/m    General: Alert and oriented, no apparent distress  Cardiovascular: RRR with no murmurs noted Respiratory: CTA bilaterally  Gastrointestinal: Bowel sounds present. No abdominal pain   ASSESSMENT/PLAN:   HYPERTENSION, BENIGN ESSENTIAL BP today 180/80.  Recheck 140/80.  Creatinine improving.  Currently on amlodipine 5 mg nightly, ramipril 5 mg daily, Lasix 40 mg daily, Imdur 30 mg daily -Continue current management -Refill for ramipril 5 mg daily, Lasix 40 mg daily, Imdur 30 mg daily -Repeat creatinine at next visit  Hyperlipidemia LDL goal < 160 Last lipid profile 7/20, cholesterol 253, LDL 182.  Patient currently on atorvastatin 80 mg daily. -Repeat lipid profile today. -Consider adding Zetia if remains elevated. -Refill atorvastatin 80 mg daily     Dana Allan, MD [facility]

## 2020-02-01 LAB — LIPID PANEL
Chol/HDL Ratio: 3.8 ratio (ref 0.0–4.4)
Cholesterol, Total: 214 mg/dL — ABNORMAL HIGH (ref 100–199)
HDL: 57 mg/dL (ref >39)
LDL Chol Calc (NIH): 147 mg/dL — ABNORMAL HIGH (ref 0–99)
Triglycerides: 58 mg/dL (ref 0–149)
VLDL Cholesterol Cal: 10 mg/dL (ref 5–40)

## 2020-02-04 ENCOUNTER — Encounter: Payer: Self-pay | Admitting: Family Medicine

## 2020-02-04 NOTE — Assessment & Plan Note (Addendum)
Last lipid profile 7/20, cholesterol 253, LDL 182.  Patient currently on atorvastatin 80 mg daily. -Repeat lipid profile today. -Consider adding Zetia if remains elevated. -Refill atorvastatin 80 mg daily

## 2020-02-04 NOTE — Assessment & Plan Note (Addendum)
BP today 180/80.  Recheck 140/80.  Creatinine improving.  Currently on amlodipine 5 mg nightly, ramipril 5 mg daily, Lasix 40 mg daily, Imdur 30 mg daily -Continue current management -Refill for ramipril 5 mg daily, Lasix 40 mg daily, Imdur 30 mg daily -Repeat creatinine at next visit

## 2020-02-13 ENCOUNTER — Encounter: Payer: Self-pay | Admitting: Family Medicine

## 2020-02-17 ENCOUNTER — Encounter: Payer: Self-pay | Admitting: Family Medicine

## 2020-02-17 ENCOUNTER — Other Ambulatory Visit: Payer: Self-pay | Admitting: Family Medicine

## 2020-02-17 MED ORDER — EZETIMIBE 10 MG PO TABS: 10.0000 mg | ORAL_TABLET | Freq: Every day | ORAL | 3 refills | Status: DC

## 2020-02-17 NOTE — Progress Notes (Signed)
Patient called.  Spoke with Patient about results.  Added Zetia, recheck in 3 months

## 2020-02-22 ENCOUNTER — Other Ambulatory Visit: Payer: Self-pay | Admitting: Family Medicine

## 2020-02-22 DIAGNOSIS — R600 Localized edema: Secondary | ICD-10-CM

## 2020-02-25 NOTE — Telephone Encounter (Signed)
I think like this was already reordered.  Thanks

## 2020-05-02 NOTE — Progress Notes (Signed)
HPI:FU HTN and diastolic CHF. Echocardiogram January 2019 showed vigorous LV systolic function, mild left ventricular hypertrophy, moderate diastolic dysfunction and mild right atrial enlargement.Since last seen,  she denies increased dyspnea, chest pain, palpitations or syncope.  Some dizziness with standing at times.  She has chronic mild pedal edema.  Current Outpatient Medications  Medication Sig Dispense Refill  . acetaminophen-codeine (TYLENOL #3) 300-30 MG tablet Take 1-2 tablets by mouth every 8 (eight) hours as needed for severe pain. 15 tablet 0  . amLODipine (NORVASC) 5 MG tablet Take 1 tablet (5 mg total) by mouth daily.    Marland Kitchen aspirin EC 81 MG tablet Take 1 tablet by mouth daily.    Marland Kitchen atorvastatin (LIPITOR) 80 MG tablet Take 1 tablet (80 mg total) by mouth daily. 90 tablet 1  . Calcium Carbonate-Vitamin D 600-400 MG-UNIT chew tablet Chew 1 tablet by mouth 2 (two) times daily. 120 tablet 12  . diclofenac sodium (VOLTAREN) 1 % GEL Apply 2 g topically 4 (four) times daily. 100 g 1  . diclofenac Sodium (VOLTAREN) 1 % GEL Apply 4 g topically 4 (four) times daily. 100 g 1  . ezetimibe (ZETIA) 10 MG tablet Take 1 tablet (10 mg total) by mouth daily. 90 tablet 3  . fluticasone (FLONASE) 50 MCG/ACT nasal spray SPRAY 2 SPRAYS INTO EACH NOSTRIL EVERY DAY 48 mL 3  . furosemide (LASIX) 40 MG tablet Take 1 tablet (40 mg total) by mouth daily. 90 tablet 1  . isosorbide mononitrate (IMDUR) 30 MG 24 hr tablet Take 1 tablet (30 mg total) by mouth daily. 90 tablet 1  . lidocaine (LIDODERM) 5 % Place 1 patch onto the skin every 12 (twelve) hours. Remove & Discard patch within 12 hours or as directed by MD 10 patch 0  . loratadine (CLARITIN) 10 MG tablet Take 1 tablet (10 mg total) by mouth daily. 90 tablet 3  . nitroGLYCERIN (NITROSTAT) 0.4 MG SL tablet Take every 5 minutes for 3 doses. If pain not resolved, call 911 or go to ER. 30 tablet 2  . ramipril (ALTACE) 5 MG capsule Take 1 capsule (5 mg  total) by mouth daily. 90 capsule 1  . risedronate (ACTONEL) 35 MG tablet Take 1 tablet (35 mg total) by mouth every 7 (seven) days. with water on empty stomach, nothing by mouth or lie down for next 30 minutes. 4 tablet 3  . famotidine (PEPCID) 20 MG tablet Take 1 tablet (20 mg total) by mouth daily. 30 tablet 3   No current facility-administered medications for this visit.     Past Medical History:  Diagnosis Date  . BPPV (benign paroxysmal positional vertigo) 03/16/2016  . Callus of foot 04/01/2014  . CHF (congestive heart failure) (HCC)   . Heart disorder   . Hyperlipidemia   . Hypertension   . Leg cramps, sleep related 05/07/2012  . Osteoporosis   . Plantar fasciitis 04/01/2014  . Subcutaneous nodule of chest wall 12/15/2010    Past Surgical History:  Procedure Laterality Date  . No prior surgery      Social History   Socioeconomic History  . Marital status: Widowed    Spouse name: Not on file  . Number of children: 6  . Years of education: 41  . Highest education level: Not on file  Occupational History  . 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 Smoking status: Current Every Day Smoker    Packs/day: 1.00  Years: 69.00    Pack years: 69.00    Types: Cigarettes  . Smokeless tobacco: Never Used  Substance and Sexual Activity  . Alcohol use: Yes    Alcohol/week: 0.0 standard drinks    Comment: OCCASIONALLY  . Drug use: No  . Sexual activity: Never  Other Topics Concern  . Not on file  Social History Narrative   Health Care POA:    Emergency Contact: daughter, Rinaldo Cloud [phone]   End of Life Plan: gave pt AD pamphlet   Who lives with you: daughter, 4 grandchildren   Any pets: 4 dogs   Diet: Pt has a varied diet of protein, starch and vegetables.   Exercise: Pt does not have regular exercise routine but does work in garden daily.   Seatbelts: Pt reports wearing seatbelt when in vehicles.    Hobbies: gardening          Social Determinants of Health   Financial Resource Strain:   . Difficulty of Paying Living Expenses:   Food Insecurity:   . Worried About Programme researcher, broadcasting/film/video in the Last Year:   . Barista in the Last Year:   Transportation Needs:   . Freight forwarder (Medical):   Marland Kitchen Lack of Transportation (Non-Medical):   Physical Activity:   . Days of Exercise per Week:   . Minutes of Exercise per Session:   Stress:   . Feeling of Stress :   Social Connections:   . Frequency of Communication with Friends and Family:   . Frequency of Social Gatherings with Friends and Family:   . Attends Religious Services:   . Active Member of Clubs or Organizations:   . Attends Banker Meetings:   Marland Kitchen Marital Status:   Intimate Partner Violence:   . Fear of Current or Ex-Partner:   . Emotionally Abused:   Marland Kitchen Physically Abused:   . Sexually Abused:     Family History  Problem Relation Age of Onset  . Cancer Sister   . Cancer Brother        Throat CA  . Diabetes Daughter     ROS: no fevers or chills, productive cough, hemoptysis, dysphasia, odynophagia, melena, hematochezia, dysuria, hematuria, rash, seizure activity, orthopnea, PND, claudication. Remaining systems are negative.  Physical Exam: Well-developed well-nourished in no acute distress.  Skin is warm and dry.  HEENT is normal.  Neck is supple.  Chest is clear to auscultation with normal expansion.  Cardiovascular exam is regular rate and rhythm.  Abdominal exam nontender or distended. No masses palpated. Extremities show 1+ ankle edema. neuro grossly intact  ECG-sinus rhythm with PACs, no ST changes.  Personally reviewed  A/P  1 Chronic diastolic CHF-patient has mild pedal edema on examination.  She will continue Lasix at present dose and take an additional 20 mg daily as needed.  2 hypertension-BP borderline and she complains of some dizziness with standing at times.  Discontinue amlodipine and follow.  3  hyperlipidemia-continue statin.  4 tobacco abuse-counseled on discontinuing.  Olga Millers, MD

## 2020-05-08 ENCOUNTER — Ambulatory Visit: Payer: Medicare Other | Admitting: Cardiology

## 2020-05-08 ENCOUNTER — Other Ambulatory Visit: Payer: Self-pay

## 2020-05-08 ENCOUNTER — Encounter: Payer: Self-pay | Admitting: Cardiology

## 2020-05-08 VITALS — BP 104/56 | HR 60 | Ht 66.5 in | Wt 152.0 lb

## 2020-05-08 DIAGNOSIS — E7800 Pure hypercholesterolemia, unspecified: Secondary | ICD-10-CM

## 2020-05-08 DIAGNOSIS — I10 Essential (primary) hypertension: Secondary | ICD-10-CM | POA: Diagnosis not present

## 2020-05-08 DIAGNOSIS — Z720 Tobacco use: Secondary | ICD-10-CM | POA: Diagnosis not present

## 2020-05-08 DIAGNOSIS — I5032 Chronic diastolic (congestive) heart failure: Secondary | ICD-10-CM | POA: Diagnosis not present

## 2020-05-08 MED ORDER — FUROSEMIDE 40 MG PO TABS: 40.0000 mg | ORAL_TABLET | Freq: Every day | ORAL | 3 refills | Status: DC

## 2020-05-08 NOTE — Patient Instructions (Signed)
Medication Instructions:   STOP AMLODIPINE  MAY TAKE AN EXTRA 1/2 FUROSEMIDE AS NEEDED FOR SWELLING  *If you need a refill on your cardiac medications before your next appointment, please call your pharmacy*   Lab Work: If you have labs (blood work) drawn today and your tests are completely normal, you will receive your results only by: Marland Kitchen MyChart Message (if you have MyChart) OR . A paper copy in the mail If you have any lab test that is abnormal or we need to change your treatment, we will call you to review the results   Follow-Up: At [facility], you and your health needs are our priority.  As part of our continuing mission to provide you with exceptional heart care, we have created designated Provider Care Teams.  These Care Teams include your primary Cardiologist (physician) and Advanced Practice Providers (APPs -  Physician Assistants and Nurse Practitioners) who all work together to provide you with the care you need, when you need it.  We recommend signing up for the patient portal called "MyChart".  Sign up information is provided on this After Visit Summary.  MyChart is used to connect with patients for Virtual Visits (Telemedicine).  Patients are able to view lab/test results, encounter notes, upcoming appointments, etc.  Non-urgent messages can be sent to your provider as well.   To learn more about what you can do with MyChart, go to [email or web address].    Your next appointment:   12 month(s)  The format for your next appointment:   In Person  Provider:   You may see Olga Millers, MD or one of the following Advanced Practice Providers on your designated Care Team:    Corine Shelter, PA-C  [place], [place]  Edd Fabian, [place]    TRACK BLOOD PRESSURE AND LET us KNOW IF TRENDING ABOVE 130/85

## 2020-05-28 ENCOUNTER — Ambulatory Visit: Payer: Medicare Other

## 2020-05-28 NOTE — Progress Notes (Deleted)
    SUBJECTIVE:   CHIEF COMPLAINT / HPI:   Swelling of foot  PERTINENT  PMH / PSH: HFpEF, PAD,   OBJECTIVE:   There were no vitals taken for this visit.  ***  ASSESSMENT/PLAN:   No problem-specific Assessment & Plan notes found for this encounter.     Sandre Kitty, MD [facility]

## 2020-05-29 ENCOUNTER — Ambulatory Visit (INDEPENDENT_AMBULATORY_CARE_PROVIDER_SITE_OTHER): Payer: Medicare Other | Admitting: Family Medicine

## 2020-05-29 ENCOUNTER — Other Ambulatory Visit: Payer: Self-pay

## 2020-05-29 DIAGNOSIS — R600 Localized edema: Secondary | ICD-10-CM | POA: Diagnosis not present

## 2020-05-29 NOTE — Progress Notes (Signed)
    SUBJECTIVE:   CHIEF COMPLAINT / HPI:   Cheryl Anderson is an 86 yr old female who presents today for leg swelling. She was accompanied by her daughter  Bilateral leg swelling Patient has history of heart failure and has chronic bilateral leg edema which has worsened recently. Takes Lasix 40 mg once a day. Was seen by cardiologist 3 weeks ago who recommended adding 20mg  lasix once daily if she felt her legs were swollen. She has not yet taken this additional lasix dose. Denies worsening dyspnea, orthopnea, PND, chest pain, palpitations or dizziness  PERTINENT  PMH / PSH: CHF, HTN, PAD, CAD, osteoporosis   OBJECTIVE:   BP (!) 180/78   Pulse 60   Wt 150 lb 9.6 oz (68.3 kg)   SpO2 97%   BMI 23.94 kg/m    General: Alert, no acute distress, well appearing  Cardio: Normal S1 and S2, RRR Pulm: CTAB, no crackles or wheeze, normal respiratory effort Extremities: Mild 1+ pitting pedal edema bilaterally Neuro: Cranial nerves grossly intact  ASSESSMENT/PLAN:   Bilateral lower extremity edema Mild pedal edema on exam today which is likely 2/2 CHF. May also have component of vascular insufficiency too. Recommended to continue lasix dose at 40mg  and add lasix 20mg  when she feels her swelling her is worse. Recommended elevation of LE throughout the day and also compression stockings. Recommended follow up with cardiologist if LE edema worsens.     , MD  PGY-2, [facility]

## 2020-05-29 NOTE — Patient Instructions (Signed)
Cheryl Anderson,  The swelling in your legs may be related to your heart failure. You can take an extra pill 20mg  lasix as your cardiologist recommended and see if this helps.   You can also wear your compression stockings and also elevate your leg throughout the day which might help.  Please follow up with your PCP or your cardiologist.   Best wishes,  Dr 

## 2020-05-31 NOTE — Assessment & Plan Note (Signed)
Mild pedal edema on exam today which is likely 2/2 CHF. May also have component of vascular insufficiency too. Recommended to continue lasix dose at 40mg  and add lasix 20mg  when she feels her swelling her is worse. Recommended elevation of LE throughout the day and also compression stockings. Recommended follow up with cardiologist if LE edema worsens.

## 2020-07-15 ENCOUNTER — Other Ambulatory Visit: Payer: Self-pay

## 2020-07-15 ENCOUNTER — Ambulatory Visit (INDEPENDENT_AMBULATORY_CARE_PROVIDER_SITE_OTHER): Payer: Medicare Other

## 2020-07-15 DIAGNOSIS — Z23 Encounter for immunization: Secondary | ICD-10-CM

## 2020-07-15 NOTE — Progress Notes (Signed)
Patient presents in nurse clinic for Flu Vaccine.  Vaccine administered LD without complications.   See admin for details.  

## 2020-07-17 ENCOUNTER — Other Ambulatory Visit: Payer: Self-pay | Admitting: Family Medicine

## 2020-07-17 DIAGNOSIS — E785 Hyperlipidemia, unspecified: Secondary | ICD-10-CM

## 2020-07-17 DIAGNOSIS — I10 Essential (primary) hypertension: Secondary | ICD-10-CM

## 2020-07-22 NOTE — Progress Notes (Deleted)
err

## 2020-08-02 ENCOUNTER — Other Ambulatory Visit: Payer: Self-pay | Admitting: Family Medicine

## 2020-08-02 DIAGNOSIS — I10 Essential (primary) hypertension: Secondary | ICD-10-CM

## 2020-09-01 ENCOUNTER — Encounter: Payer: Self-pay | Admitting: Family Medicine

## 2020-09-01 ENCOUNTER — Other Ambulatory Visit: Payer: Self-pay

## 2020-09-01 ENCOUNTER — Ambulatory Visit (INDEPENDENT_AMBULATORY_CARE_PROVIDER_SITE_OTHER): Payer: Medicare Other | Admitting: Family Medicine

## 2020-09-01 VITALS — BP 128/60 | HR 48 | Ht 66.5 in | Wt 151.6 lb

## 2020-09-01 DIAGNOSIS — R600 Localized edema: Secondary | ICD-10-CM

## 2020-09-01 DIAGNOSIS — Z716 Tobacco abuse counseling: Secondary | ICD-10-CM | POA: Diagnosis not present

## 2020-09-01 DIAGNOSIS — M19011 Primary osteoarthritis, right shoulder: Secondary | ICD-10-CM | POA: Diagnosis not present

## 2020-09-01 MED ORDER — DICLOFENAC SODIUM 1 % EX GEL: 4.0000 g | Freq: Four times a day (QID) | CUTANEOUS | 1 refills | Status: DC

## 2020-09-01 MED ORDER — NICOTINE POLACRILEX 2 MG MT GUM: 2.0000 mg | CHEWING_GUM | OROMUCOSAL | 0 refills | Status: DC | PRN

## 2020-09-01 NOTE — Assessment & Plan Note (Addendum)
Exam reassuring no bony tenderness. Not likely rotator cuff tear, adhesive capsulitis, muscular strain or impingement. Start Tylenol 1000 mg TID PRN, Voltaren gel Rx sent to pharmacy.  Follow up

## 2020-09-01 NOTE — Patient Instructions (Signed)
It was great seeing you today!  As discussed STOP taking amlodipine as this may be worsening your leg swelling.  Decrease your Lasix to 40 mg daily and take 20 mg as needed (as directed by your cardiologist).   Stop by the pharmacy to pick up your medications.   For your shoulder pain take Tylenol 1000 mg up to three times a day along with Voltaren gel for arthritis pain.    I'd like to see you back  for any new issues we're happy to fit you in, just give [place] a call!   If you have questions or concerns please do not hesitate to call at [phone].  Dr. Katherina Right [facility]

## 2020-09-01 NOTE — Assessment & Plan Note (Signed)
Smoking cessation instruction/counseling given:  counseled patient on the dangers of tobacco use, advised patient to stop smoking, and reviewed strategies to maximize success. Pt states she has not smoked in the past few days. She has been smoking since 86 yo.  Would like to try nicotine replacement gum. Rx sent to pharmacy.  Pt provided with 1-800-quit-now

## 2020-09-01 NOTE — Assessment & Plan Note (Addendum)
Bilateral LE edema is chronic and at baseline (per patient). Not likely DVT or CHF exacerbation and exam reassuring. Hx of HFpEF and last ECHO 10/2017 showed EF 65-70%, G2DD, mild LVH, vigorous LV function, moderate diastolic dysfunction and mild right atrial enlargement. Patient follows with [facility]. Saw Dr. Jens Som in 01/2020 who recommended additional 20 mg Lasix for LE edema as needed. Decrease Lasix to 40 mg daily. Appearing to be near her dry weight. Patient to stop taking amlodipine and this is likely contributing to her LE edema.  Encouraged patient to use compression hose and to elevate her legs above her heart whenever possible. Follow up with Dr. Jens Som.   Consider recommending compression caddy to help pt put on compression hose.

## 2020-09-01 NOTE — Progress Notes (Addendum)
° °  SUBJECTIVE:   CHIEF COMPLAINT / HPI:   Chief Complaint  Patient presents with   Foot Swelling     Cheryl Anderson is a 86 y.o. female here for bilateral lower extremity edema.  Patient reports LE edema is about the same. Denies DOE, chest pain, shortness of breath at rest, orthopnea. Pt states her legs her around the ankles. She states that her LE edema does not improve with elevation. Daughter reports that her mom does not wear her compression hose and he legs swell so fast. Endorses LE cramping.  Takes 60 mg Lasix daily with little change in edema.  She has hx of chronic LE edema, HFpEF, CAD, HLD and HTN.  Patient follows with Dr Julian Hy, cardiologist.     PERTINENT  PMH / PSH: reviewed and updated as appropriate   OBJECTIVE:   BP 128/60    Pulse (!) 48    Ht 5' 6.5" (1.689 m)    Wt 151 lb 9.6 oz (68.8 kg)    SpO2 99%    BMI 24.10 kg/m    GEN: pleasant elderly female, appears younger than stated age, in no acute distress  CV: regular rate and rhythm, no murmurs appreciated, no JVP  RESP: no increased work of breathing, clear to ascultation bilaterally with no crackles, wheezes, or rhonchi  MSK:  1+ bilateral ankle edema, right shoulder with good ROM (flexion, extension, abduction, adduction, internal and external rotation), normal strength (grip, biceps and triceps), no overlying skin changes, no bony tenderness, Negative Neers. Neurovascularly intact  SKIN: warm, dry NEURO: alert, moves all extremities appropriately, hard of hearing    ASSESSMENT/PLAN:   Bilateral lower extremity edema Bilateral LE edema is chronic and at baseline (per patient). Not likely DVT or CHF exacerbation and exam reassuring. Hx of HFpEF and last ECHO 10/2017 showed EF 65-70%, G2DD, mild LVH, vigorous LV function, moderate diastolic dysfunction and mild right atrial enlargement. Patient follows with [facility]. Saw Dr. Jens Som in 01/2020 who recommended additional 20 mg Lasix for LE edema as  needed. Decrease Lasix to 40 mg daily. Appearing to be near her dry weight. Patient to stop taking amlodipine and this is likely contributing to her LE edema.  Encouraged patient to use compression hose and to elevate her legs above her heart whenever possible. Follow up with Dr. Jens Som.   Consider recommending compression caddy to help pt put on compression hose.    Encounter for smoking cessation counseling Smoking cessation instruction/counseling given:  counseled patient on the dangers of tobacco use, advised patient to stop smoking, and reviewed strategies to maximize success. Pt states she has not smoked in the past few days. She has been smoking since 86 yo.  Would like to try nicotine replacement gum. Rx sent to pharmacy.  Pt provided with 1-800-quit-now    Arthritis of right shoulder region Exam reassuring no bony tenderness. Not likely rotator cuff tear, adhesive capsulitis, muscular strain or impingement. Start Tylenol 1000 mg TID PRN, Voltaren gel Rx sent to pharmacy.  Follow up      Katha Cabal, DO PGY-2, [facility] 09/01/2020

## 2020-11-01 ENCOUNTER — Other Ambulatory Visit: Payer: Self-pay | Admitting: Family Medicine

## 2020-11-01 DIAGNOSIS — I5032 Chronic diastolic (congestive) heart failure: Secondary | ICD-10-CM

## 2021-03-10 ENCOUNTER — Ambulatory Visit: Payer: Medicare Other | Admitting: Family Medicine

## 2021-03-12 ENCOUNTER — Ambulatory Visit: Payer: Medicare Other | Admitting: Family Medicine

## 2021-03-12 NOTE — Progress Notes (Deleted)
     SUBJECTIVE:   CHIEF COMPLAINT / HPI:   Cheryl Anderson is a 87 y.o. female presents for lower extremity edema  Accompanied by her daughter   Lower extremity edema Pt has hx of HFpEF and last ECHO 10/2017 showed EF 65-70%. Sees Cardiologist ***. Last saw them on ***. She currently takes Lasix *** with PRN *** for LE edema. Seen in clinic on 09/01/20 and 05/29/20 for LE edema. No signs of acute CHF at these visits. Recommended same lasix dose above and PRN dose along with compression stockings.  Flowsheet Row Office Visit from 09/01/2020 in [facility]  PHQ-9 Total Score 0       Health Maintenance Due  Topic  . Zoster Vaccines- Shingrix (1 of 2)  . Pneumococcal Vaccine 55-65 Years old (1 of 2 - PPSV23)  . TETANUS/TDAP       PERTINENT  PMH / PSH:   OBJECTIVE:   There were no vitals taken for this visit.   General: Alert, no acute distress Cardio: Normal S1 and S2, RRR, no r/m/g Pulm: CTAB, normal work of breathing Abdomen: Bowel sounds normal. Abdomen soft and non-tender.  Extremities: No peripheral edema.  Neuro: Cranial nerves grossly intact   ASSESSMENT/PLAN:   No problem-specific Assessment & Plan notes found for this encounter.     Towanda Octave, MD PGY-2 [facility]

## 2021-03-15 ENCOUNTER — Ambulatory Visit (INDEPENDENT_AMBULATORY_CARE_PROVIDER_SITE_OTHER): Payer: Medicare Other | Admitting: Family Medicine

## 2021-03-15 ENCOUNTER — Ambulatory Visit: Payer: Medicare Other

## 2021-03-15 ENCOUNTER — Other Ambulatory Visit: Payer: Self-pay

## 2021-03-15 VITALS — BP 146/80 | HR 53 | Wt 143.0 lb

## 2021-03-15 DIAGNOSIS — R600 Localized edema: Secondary | ICD-10-CM | POA: Diagnosis not present

## 2021-03-15 DIAGNOSIS — I10 Essential (primary) hypertension: Secondary | ICD-10-CM

## 2021-03-15 DIAGNOSIS — Z0000 Encounter for general adult medical examination without abnormal findings: Secondary | ICD-10-CM | POA: Insufficient documentation

## 2021-03-15 DIAGNOSIS — Z23 Encounter for immunization: Secondary | ICD-10-CM | POA: Diagnosis not present

## 2021-03-15 MED ORDER — TETANUS-DIPHTH-ACELL PERTUSSIS 5-2.5-18.5 LF-MCG/0.5 IM SUSP: 0.5000 mL | Freq: Once | INTRAMUSCULAR | 0 refills | Status: AC

## 2021-03-15 NOTE — Progress Notes (Signed)
    SUBJECTIVE:   CHIEF COMPLAINT / HPI: "PCV, TDAP, ankle swelling"   Ms. Cheryl Anderson is a 87 year old female presenting with her daughter to discuss the following:  Ankle swelling: Worse since about end of last year. She has known chronic bilateral lower extremity edema in the setting of venous insufficiency and HFpEF.  Follows with cardiology, on Lasix 40 mg daily and additional 20 mg as needed-however she has been taking as 1.5 pills daily.  Mainly notices her swelling after she starts walking around for the day, least amount when she wakes up in the morning.  She wears her compression stockings intermittently, however frustrated because the swelling comes back whenever she is not wearing them.  Denies any associated shortness of breath, abdominal swelling, chest pain, or difficulty laying flat.  Sleeps well.  Well-balanced diet with low sodium.  She would also like to get her Tdap vaccine.  Otherwise reports that she has been doing well.  PERTINENT  PMH / PSH: Hypertension, PAD, HFpEF, CAD, COPD, chronic bilateral lower extremity edema  OBJECTIVE:   BP (!) 146/80   Pulse (!) 53   Wt 143 lb (64.9 kg)   SpO2 97%   BMI 22.74 kg/m   General: Alert, NAD, appears younger than stated age HEENT: NCAT, MMM Cardiac: RRR  Lungs: Clear bilaterally, no increased WOB, no crackles present Abdomen: soft Msk: Normal gait.  Ext: Warm, dry, palpable DP pulses bilaterally, Trace-1+ pitting edema to lower shin bilaterally and equally.  No overlying rash/erythema.  ASSESSMENT/PLAN:   Bilateral lower extremity edema Mild and dependent, likely largely due to venous insufficiency.  Known history of HFpEF, however given duration and in no additional respiratory/cardiac concerns, low suspicion for progression as etiology.  Weight currently below previous dry weight.  Discussed pathophysiology of dependent edema/venous insufficiency, encouraged to use compression stockings and elevate legs when possible with  the understanding that she will still likely have some swelling at baseline.  Use Lasix 40 mg daily, 20 mg as needed.  Check CBC, CMP, P/Cr ratio since last labs in 2021 to ensure no alternative contribution.  HYPERTENSION, BENIGN ESSENTIAL Slightly elevated today, however has a history of elevated office readings in comparison to home blood pressure monitoring.  Continue medications as is.  Healthcare maintenance Provided prescription for Tdap at her local pharmacy    Follow-up if not improving/worsening, otherwise follow-up in 3 months for general check in.   Allayne Stack, DO [facility]

## 2021-03-15 NOTE — Assessment & Plan Note (Signed)
Provided prescription for Tdap at her local pharmacy

## 2021-03-15 NOTE — Patient Instructions (Signed)
Wonderful to see you!   Keep elevating your legs as often as possible. DO laxix 40mg  everyday, the 20mg  as needed when swelling gets worse or weight gain at home.   Wear compression stockings to help with getting blood back up. You will likely still have swelling despite some of these just from the nature of the swelling cause.

## 2021-03-15 NOTE — Assessment & Plan Note (Signed)
Mild and dependent, likely largely due to venous insufficiency.  Known history of HFpEF, however given duration and in no additional respiratory/cardiac concerns, low suspicion for progression as etiology.  Weight currently below previous dry weight.  Discussed pathophysiology of dependent edema/venous insufficiency, encouraged to use compression stockings and elevate legs when possible with the understanding that she will still likely have some swelling at baseline.  Use Lasix 40 mg daily, 20 mg as needed.  Check CBC, CMP, P/Cr ratio since last labs in 2021 to ensure no alternative contribution.

## 2021-03-15 NOTE — Assessment & Plan Note (Signed)
Slightly elevated today, however has a history of elevated office readings in comparison to home blood pressure monitoring.  Continue medications as is.

## 2021-03-16 LAB — CBC WITH DIFFERENTIAL/PLATELET
Basophils Absolute: 0.1 10*3/uL (ref 0.0–0.2)
Basos: 1 %
EOS (ABSOLUTE): 0.1 10*3/uL (ref 0.0–0.4)
Eos: 2 %
Hematocrit: 40.6 % (ref 34.0–46.6)
Hemoglobin: 13.2 g/dL (ref 11.1–15.9)
Immature Grans (Abs): 0 10*3/uL (ref 0.0–0.1)
Immature Granulocytes: 0 %
Lymphocytes Absolute: 2 10*3/uL (ref 0.7–3.1)
Lymphs: 33 %
MCH: 30.2 pg (ref 26.6–33.0)
MCHC: 32.5 g/dL (ref 31.5–35.7)
MCV: 93 fL (ref 79–97)
Monocytes Absolute: 0.5 10*3/uL (ref 0.1–0.9)
Monocytes: 8 %
Neutrophils Absolute: 3.4 10*3/uL (ref 1.4–7.0)
Neutrophils: 56 %
Platelets: 277 10*3/uL (ref 150–450)
RBC: 4.37 x10E6/uL (ref 3.77–5.28)
RDW: 12.4 % (ref 11.7–15.4)
WBC: 6.1 10*3/uL (ref 3.4–10.8)

## 2021-03-16 LAB — COMPREHENSIVE METABOLIC PANEL
ALT: 16 IU/L (ref 0–32)
AST: 15 IU/L (ref 0–40)
Albumin/Globulin Ratio: 1.5 (ref 1.2–2.2)
Albumin: 4 g/dL (ref 3.5–4.6)
Alkaline Phosphatase: 70 IU/L (ref 44–121)
BUN/Creatinine Ratio: 18 (ref 12–28)
BUN: 18 mg/dL (ref 10–36)
Bilirubin Total: 0.5 mg/dL (ref 0.0–1.2)
CO2: 25 mmol/L (ref 20–29)
Calcium: 9.3 mg/dL (ref 8.7–10.3)
Chloride: 106 mmol/L (ref 96–106)
Creatinine, Ser: 1.02 mg/dL — ABNORMAL HIGH (ref 0.57–1.00)
Globulin, Total: 2.6 g/dL (ref 1.5–4.5)
Glucose: 90 mg/dL (ref 65–99)
Potassium: 4.5 mmol/L (ref 3.5–5.2)
Sodium: 144 mmol/L (ref 134–144)
Total Protein: 6.6 g/dL (ref 6.0–8.5)
eGFR: 52 mL/min/{1.73_m2} — ABNORMAL LOW (ref >59)

## 2021-03-16 LAB — PROTEIN / CREATININE RATIO, URINE
Creatinine, Urine: 22.3 mg/dL
Protein, Ur: <4 mg/dL

## 2021-05-18 NOTE — Progress Notes (Signed)
HPI: FU HTN and diastolic CHF. Echocardiogram January 2019 showed vigorous LV systolic function, mild left ventricular hypertrophy, moderate diastolic dysfunction and mild right atrial enlargement. Since last seen, she denies dyspnea, chest pain, palpitations or syncope.  She has chronic mild pedal edema.  Current Outpatient Medications  Medication Sig Dispense Refill   acetaminophen-codeine (TYLENOL #3) 300-30 MG tablet Take 1-2 tablets by mouth every 8 (eight) hours as needed for severe pain. 15 tablet 0   aspirin EC 81 MG tablet Take 1 tablet by mouth daily.     atorvastatin (LIPITOR) 80 MG tablet TAKE 1 TABLET BY MOUTH EVERY DAY 90 tablet 1   Calcium Carbonate-Vitamin D 600-400 MG-UNIT chew tablet Chew 1 tablet by mouth 2 (two) times daily. 120 tablet 12   ezetimibe (ZETIA) 10 MG tablet Take 1 tablet (10 mg total) by mouth daily. 90 tablet 3   fluticasone (FLONASE) 50 MCG/ACT nasal spray SPRAY 2 SPRAYS INTO EACH NOSTRIL EVERY DAY 48 mL 3   furosemide (LASIX) 40 MG tablet TAKE 1 TABLET BY MOUTH EVERY DAY 90 tablet 1   nitroGLYCERIN (NITROSTAT) 0.4 MG SL tablet Take every 5 minutes for 3 doses. If pain not resolved, call 911 or go to ER. 30 tablet 2   ramipril (ALTACE) 5 MG capsule TAKE 1 CAPSULE BY MOUTH EVERY DAY 90 capsule 1   risedronate (ACTONEL) 35 MG tablet Take 1 tablet (35 mg total) by mouth every 7 (seven) days. with water on empty stomach, nothing by mouth or lie down for next 30 minutes. 4 tablet 3   famotidine (PEPCID) 20 MG tablet Take 1 tablet (20 mg total) by mouth daily. 30 tablet 3   isosorbide mononitrate (IMDUR) 30 MG 24 hr tablet TAKE 1 TABLET BY MOUTH EVERY DAY (Patient not taking: Reported on 05/20/2021) 90 tablet 1   No current facility-administered medications for this visit.     Past Medical History:  Diagnosis Date   BPPV (benign paroxysmal positional vertigo) 03/16/2016   Callus of foot 04/01/2014   CHF (congestive heart failure) (HCC)    Heart disorder     Hyperlipidemia    Hypertension    Leg cramps, sleep related 05/07/2012   Osteoporosis    Plantar fasciitis 04/01/2014   Subcutaneous nodule of chest wall 12/15/2010    Past Surgical History:  Procedure Laterality Date   No prior surgery      Social History   Socioeconomic History   Marital status: Widowed    Spouse name: Not on file   Number of children: 6   Years of education: 33   Highest education level: Not on file  Occupational History   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  Tobacco Use   Smoking status: Every Day    Packs/day: 1.00    Years: 69.00    Pack years: 69.00    Types: Cigarettes   Smokeless tobacco: Never  Substance and Sexual Activity   Alcohol use: Yes    Alcohol/week: 0.0 standard drinks    Comment: OCCASIONALLY   Drug use: No   Sexual activity: Never  Other Topics Concern   Not on file  Social History Narrative   Health Care POA:    Emergency Contact: daughter, Rinaldo Cloud [phone]   End of Life Plan: gave pt AD pamphlet   Who lives with you: daughter, 4 grandchildren   Any pets: 4 dogs   Diet: Pt has a varied diet of protein, starch and vegetables.  Exercise: Pt does not have regular exercise routine but does work in garden daily.   Seatbelts: Pt reports wearing seatbelt when in vehicles.    Hobbies: gardening         Social Determinants of Corporate investment banker Strain: Not on file  Food Insecurity: Not on file  Transportation Needs: Not on file  Physical Activity: Not on file  Stress: Not on file  Social Connections: Not on file  Intimate Partner Violence: Not on file    Family History  Problem Relation Age of Onset   Cancer Sister    Cancer Brother        Throat CA   Diabetes Daughter     ROS: no fevers or chills, productive cough, hemoptysis, dysphasia, odynophagia, melena, hematochezia, dysuria, hematuria, rash, seizure activity, orthopnea, PND, pedal edema, claudication. Remaining systems are  negative.  Physical Exam: Well-developed well-nourished in no acute distress.  Skin is warm and dry.  HEENT is normal.  Neck is supple.  Chest is clear to auscultation with normal expansion.  Cardiovascular exam is regular rate and rhythm.  Abdominal exam nontender or distended. No masses palpated. Extremities show trace ankle edema. neuro grossly intact  ECG-normal sinus rhythm with PACs, normal axis, no ST changes.  Personally reviewed  A/P  1 chronic diastolic congestive heart failure-patient appears to be euvolemic on examination.  We will continue Lasix at present dose.    2 hypertension-patient's blood pressure is controlled.  We will continue present medical regimen and follow.  3 hyperlipidemia-continue statin.  4 tobacco abuse-patient again counseled on discontinuing.  Olga Millers, MD

## 2021-05-20 ENCOUNTER — Ambulatory Visit: Payer: Medicare Other | Admitting: Cardiology

## 2021-05-20 ENCOUNTER — Encounter: Payer: Self-pay | Admitting: Cardiology

## 2021-05-20 ENCOUNTER — Other Ambulatory Visit: Payer: Self-pay

## 2021-05-20 VITALS — BP 140/72 | HR 59 | Ht 66.5 in | Wt 146.8 lb

## 2021-05-20 DIAGNOSIS — E7800 Pure hypercholesterolemia, unspecified: Secondary | ICD-10-CM

## 2021-05-20 DIAGNOSIS — I5032 Chronic diastolic (congestive) heart failure: Secondary | ICD-10-CM

## 2021-05-20 DIAGNOSIS — I10 Essential (primary) hypertension: Secondary | ICD-10-CM | POA: Diagnosis not present

## 2021-05-20 DIAGNOSIS — Z720 Tobacco use: Secondary | ICD-10-CM

## 2021-05-20 NOTE — Patient Instructions (Signed)

## 2021-07-16 ENCOUNTER — Other Ambulatory Visit: Payer: Self-pay

## 2021-07-16 ENCOUNTER — Ambulatory Visit (INDEPENDENT_AMBULATORY_CARE_PROVIDER_SITE_OTHER): Payer: Medicare Other

## 2021-07-16 DIAGNOSIS — Z23 Encounter for immunization: Secondary | ICD-10-CM

## 2021-07-17 ENCOUNTER — Other Ambulatory Visit: Payer: Self-pay

## 2021-07-17 ENCOUNTER — Emergency Department (HOSPITAL_COMMUNITY)
Admission: EM | Admit: 2021-07-17 | Discharge: 2021-07-17 | Disposition: A | Discharge status: Home / Self Care | Payer: Medicare Other | Attending: Emergency Medicine | Admitting: Emergency Medicine

## 2021-07-17 ENCOUNTER — Emergency Department (HOSPITAL_COMMUNITY): Payer: Medicare Other

## 2021-07-17 ENCOUNTER — Ambulatory Visit
Admission: EM | Admit: 2021-07-17 | Discharge: 2021-07-17 | Disposition: A | Discharge status: Other Acute Inpatient Hospital | Payer: Medicare Other

## 2021-07-17 ENCOUNTER — Encounter (HOSPITAL_COMMUNITY): Payer: Self-pay | Admitting: Emergency Medicine

## 2021-07-17 DIAGNOSIS — F17210 Nicotine dependence, cigarettes, uncomplicated: Secondary | ICD-10-CM | POA: Insufficient documentation

## 2021-07-17 DIAGNOSIS — Z79899 Other long term (current) drug therapy: Secondary | ICD-10-CM | POA: Diagnosis not present

## 2021-07-17 DIAGNOSIS — I2510 Atherosclerotic heart disease of native coronary artery without angina pectoris: Secondary | ICD-10-CM | POA: Insufficient documentation

## 2021-07-17 DIAGNOSIS — R42 Dizziness and giddiness: Secondary | ICD-10-CM

## 2021-07-17 DIAGNOSIS — R1084 Generalized abdominal pain: Secondary | ICD-10-CM

## 2021-07-17 DIAGNOSIS — I110 Hypertensive heart disease with heart failure: Secondary | ICD-10-CM | POA: Insufficient documentation

## 2021-07-17 DIAGNOSIS — I509 Heart failure, unspecified: Secondary | ICD-10-CM | POA: Insufficient documentation

## 2021-07-17 DIAGNOSIS — Z7982 Long term (current) use of aspirin: Secondary | ICD-10-CM | POA: Insufficient documentation

## 2021-07-17 DIAGNOSIS — I10 Essential (primary) hypertension: Secondary | ICD-10-CM

## 2021-07-17 DIAGNOSIS — I161 Hypertensive emergency: Secondary | ICD-10-CM

## 2021-07-17 LAB — CBC
HCT: 44.2 % (ref 36.0–46.0)
Hemoglobin: 13.8 g/dL (ref 12.0–15.0)
MCH: 30.5 pg (ref 26.0–34.0)
MCHC: 31.2 g/dL (ref 30.0–36.0)
MCV: 97.6 fL (ref 80.0–100.0)
Platelets: 254 10*3/uL (ref 150–400)
RBC: 4.53 MIL/uL (ref 3.87–5.11)
RDW: 13.7 % (ref 11.5–15.5)
WBC: 5.5 10*3/uL (ref 4.0–10.5)
nRBC: 0 % (ref 0.0–0.2)

## 2021-07-17 LAB — BASIC METABOLIC PANEL
Anion gap: 7 (ref 5–15)
BUN: 20 mg/dL (ref 8–23)
CO2: 29 mmol/L (ref 22–32)
Calcium: 9.2 mg/dL (ref 8.9–10.3)
Chloride: 105 mmol/L (ref 98–111)
Creatinine, Ser: 0.93 mg/dL (ref 0.44–1.00)
GFR, Estimated: 58 mL/min — ABNORMAL LOW (ref >60)
Glucose, Bld: 96 mg/dL (ref 70–99)
Potassium: 4.3 mmol/L (ref 3.5–5.1)
Sodium: 141 mmol/L (ref 135–145)

## 2021-07-17 LAB — URINALYSIS, ROUTINE W REFLEX MICROSCOPIC
Bilirubin Urine: NEGATIVE
Glucose, UA: NEGATIVE mg/dL
Hgb urine dipstick: NEGATIVE
Ketones, ur: NEGATIVE mg/dL
Leukocytes,Ua: NEGATIVE
Nitrite: NEGATIVE
Protein, ur: NEGATIVE mg/dL
Specific Gravity, Urine: 1.01 (ref 1.005–1.030)
pH: 7 (ref 5.0–8.0)

## 2021-07-17 LAB — TROPONIN I (HIGH SENSITIVITY)
Troponin I (High Sensitivity): 7 ng/L (ref <18)
Troponin I (High Sensitivity): 11 ng/L (ref <18)

## 2021-07-17 MED ORDER — SODIUM CHLORIDE 0.9 % IV BOLUS
500.0000 mL | Freq: Once | INTRAVENOUS | Status: AC
  Administered 2021-07-17: 500 mL via INTRAVENOUS

## 2021-07-17 MED ORDER — HYDRALAZINE HCL 20 MG/ML IJ SOLN
20.0000 mg | Freq: Once | INTRAMUSCULAR | Status: AC
  Administered 2021-07-17: 20 mg via INTRAVENOUS
  Filled 2021-07-17: qty 1

## 2021-07-17 NOTE — ED Provider Notes (Signed)
Emergency Medicine Provider Triage Evaluation Note  Cheryl Anderson , a 87 y.o. female  was evaluated in triage.  Pt complains of elevated blood pressure.  Evaluated in urgent care today, where she noted to be feeling very dizzy.  Has not taken blood pressure medication since last week.  Review of Systems  Positive: dizzy Negative: Shortness of breath, chest pain  Physical Exam  There were no vitals taken for this visit. Gen:   Awake, no distress   Resp:  Normal effort  MSK:   Moves extremities without difficulty  Other:  1+ pitting edema, lungs diminished to auscultation   Medical Decision Making  Medically screening exam initiated at 2:13 PM.  Appropriate orders placed.  [name] was informed that the remainder of the evaluation will be completed by another provider, this initial triage assessment does not replace that evaluation, and the importance of remaining in the ED until their evaluation is complete.  Patient here for elevated blood pressure, noncompliance with medication.  Felt dizzy this morning therefore evaluated in urgent care and sent to the ED for further evaluation.   Claude Manges, PA-C 07/17/21 1416    Gerhard Munch, MD 07/17/21 1601

## 2021-07-17 NOTE — ED Provider Notes (Signed)
[facility]    CSN: [id number] Arrival date & time: 07/17/21  1233      History   Chief Complaint Chief Complaint  Patient presents with   Dizziness    HPI TASHUNDA VANDEZANDE is a 87 y.o. female.   Patient presents with dizziness and abdominal pain that started today.  Patient reports that dizziness and abdominal pain have present for a few minutes while she was sitting at a table doing a puzzle earlier today.  They have now resolved.  Patient is not able to characterize abdominal pain but states that it was in the lower abdominal area.  Dizziness is intermittent and is exacerbated by position change.  Denies any headache, blurred vision, nausea, vomiting, diarrhea.  Last bowel movement was this morning and is described as "hard in consistency".  Denies noticing blood in the stool.  Denies chest pain or shortness of breath.  Patient also has elevated blood pressure reading but states that she has not taken her blood pressure medication in multiple days.   Dizziness  Past Medical History:  Diagnosis Date   BPPV (benign paroxysmal positional vertigo) 03/16/2016   Callus of foot 04/01/2014   CHF (congestive heart failure) (HCC)    Heart disorder    Hyperlipidemia    Hypertension    Leg cramps, sleep related 05/07/2012   Osteoporosis    Plantar fasciitis 04/01/2014   Subcutaneous nodule of chest wall 12/15/2010    Patient Active Problem List   Diagnosis Date Noted   Healthcare maintenance 03/15/2021   Arthritis of right shoulder region 09/01/2020   Back pain 05/25/2019   Constipation 05/14/2019   Seasonal allergies 04/10/2019   Nondisplaced fracture of fifth right metatarsal bone with routine healing 11/06/2018   Encounter for smoking cessation counseling 10/26/2018   CAD (coronary artery disease) 02/08/2018   (HFpEF) heart failure with preserved ejection fraction (HCC) 11/07/2017   Bilateral lower extremity edema 05/24/2017   Hearing loss 03/16/2016   Visual  impairment 03/16/2016   Peripheral arterial disease (HCC) 03/13/2015   Osteoarthritis of right knee 02/01/2015   Unspecified vitamin D deficiency 08/20/2013   Osteoporosis, unspecified 07/25/2013   Positive PPD 02/10/2011   Edema 11/27/2009   Hyperlipidemia LDL goal < 160 12/27/2006   TOBACCO USE 12/27/2006   HYPERTENSION, BENIGN ESSENTIAL 12/27/2006   CHRONIC OBSTRUCTIVE PULMONARY DISEASE 12/27/2006   GASTROESOPHAGEAL REFLUX, NO ESOPHAGITIS 12/07/2006    Past Surgical History:  Procedure Laterality Date   No prior surgery      OB History   No obstetric history on file.      Home Medications    Prior to Admission medications   Medication Sig Start Date End Date Taking? Authorizing Provider  acetaminophen-codeine (TYLENOL #3) 300-30 MG tablet Take 1-2 tablets by mouth every 8 (eight) hours as needed for severe pain. 11/05/18   Wallis Bamberg, PA-C  aspirin EC 81 MG tablet Take 1 tablet by mouth daily.    [provider]  atorvastatin (LIPITOR) 80 MG tablet TAKE 1 TABLET BY MOUTH EVERY DAY 07/17/20   Jackelyn Poling, DO  Calcium Carbonate-Vitamin D 600-400 MG-UNIT chew tablet Chew 1 tablet by mouth 2 (two) times daily. 11/07/17   Leland Her, DO  ezetimibe (ZETIA) 10 MG tablet Take 1 tablet (10 mg total) by mouth daily. 02/17/20   Dana Allan, MD  famotidine (PEPCID) 20 MG tablet Take 1 tablet (20 mg total) by mouth daily. 05/22/19 06/21/19  Dana Allan, MD  fluticasone (FLONASE) 50 MCG/ACT  nasal spray SPRAY 2 SPRAYS INTO EACH NOSTRIL EVERY DAY 09/06/19   Dana Allan, MD  furosemide (LASIX) 40 MG tablet TAKE 1 TABLET BY MOUTH EVERY DAY 11/02/20   Dana Allan, MD  isosorbide mononitrate (IMDUR) 30 MG 24 hr tablet TAKE 1 TABLET BY MOUTH EVERY DAY Patient not taking: Reported on 05/20/2021 08/03/20   Dana Allan, MD  nitroGLYCERIN (NITROSTAT) 0.4 MG SL tablet Take every 5 minutes for 3 doses. If pain not resolved, call 911 or go to ER. 11/02/16   Leland Her, DO  ramipril  (ALTACE) 5 MG capsule TAKE 1 CAPSULE BY MOUTH EVERY DAY 07/17/20   Jackelyn Poling, DO  risedronate (ACTONEL) 35 MG tablet Take 1 tablet (35 mg total) by mouth every 7 (seven) days. with water on empty stomach, nothing by mouth or lie down for next 30 minutes. 02/12/19   Leland Her, DO    Family History Family History  Problem Relation Age of Onset   Cancer Sister    Cancer Brother        Throat CA   Diabetes Daughter     Social History Social History   Tobacco Use   Smoking status: Every Day    Packs/day: 1.00    Years: 69.00    Pack years: 69.00    Types: Cigarettes   Smokeless tobacco: Never  Substance Use Topics   Alcohol use: Yes    Alcohol/week: 0.0 standard drinks    Comment: OCCASIONALLY   Drug use: No     Allergies   Codeine   Review of Systems Review of Systems Per HPI  Physical Exam Triage Vital Signs ED Triage Vitals [07/17/21 1247]  Enc Vitals Group     BP (!) 208/93     Pulse Rate (!) 59     Resp 18     Temp 97.6 F (36.4 C)     Temp Source Oral     SpO2 97 %     Weight      Height      Head Circumference      Peak Flow      Pain Score 0     Pain Loc      Pain Edu?      Excl. in GC?    No data found.  Updated Vital Signs BP (!) 208/93 (BP Location: Right Arm)   Pulse (!) 59   Temp 97.6 F (36.4 C) (Oral)   Resp 18   SpO2 97%   Visual Acuity Right Eye Distance:   Left Eye Distance:   Bilateral Distance:    Right Eye Near:   Left Eye Near:    Bilateral Near:     Physical Exam Constitutional:      General: She is not in acute distress.    Appearance: Normal appearance. She is not toxic-appearing or diaphoretic.  HENT:     Head: Normocephalic and atraumatic.     Right Ear: Tympanic membrane and ear canal normal.     Left Ear: Tympanic membrane and ear canal normal.     Nose: Nose normal.     Mouth/Throat:     Mouth: Mucous membranes are moist.  Eyes:     Extraocular Movements: Extraocular movements intact.      Conjunctiva/sclera: Conjunctivae normal.     Pupils: Pupils are equal, round, and reactive to light.  Cardiovascular:     Rate and Rhythm: Normal rate and regular rhythm.     Pulses: Normal pulses.  Heart sounds: Normal heart sounds.  Pulmonary:     Effort: Pulmonary effort is normal. No respiratory distress.     Breath sounds: Normal breath sounds.  Abdominal:     General: Bowel sounds are normal. There is no distension.     Palpations: Abdomen is soft.     Tenderness: There is no abdominal tenderness.  Skin:    General: Skin is warm and dry.  Neurological:     General: No focal deficit present.     Mental Status: She is alert and oriented to person, place, and time. Mental status is at baseline.     Cranial Nerves: Cranial nerves are intact.     Sensory: Sensation is intact.     Motor: Motor function is intact.     Coordination: Coordination is intact.     Gait: Gait is intact.  Psychiatric:        Mood and Affect: Mood normal.        Behavior: Behavior normal.        Thought Content: Thought content normal.        Judgment: Judgment normal.     UC Treatments / Results  Labs (all labs ordered are listed, but only abnormal results are displayed) Labs Reviewed - No data to display  EKG   Radiology No results found.  Procedures Procedures (including critical care time)  Medications Ordered in UC Medications - No data to display  Initial Impression / Assessment and Plan / UC Course  I have reviewed the triage vital signs and the nursing notes.  Pertinent labs & imaging results that were available during my care of the patient were reviewed by me and considered in my medical decision making (see chart for details).     Neuro exam was fairly normal but patient is exhibiting signs of hypertensive emergency.  Due to patient's age, risk factors, and associated symptoms, patient was advised to go the hospital for further evaluation and management.  Patient was  agreeable with plan.  Patient left via EMS due to elevated blood pressure. Final Clinical Impressions(s) / UC Diagnoses   Final diagnoses:  Hypertensive emergency  Dizziness and giddiness  Generalized abdominal pain     Discharge Instructions      Patient was sent to the hospital via EMS.     ED Prescriptions   None    PDMP not reviewed this encounter.   Lance Muss, FNP 07/17/21 1324

## 2021-07-17 NOTE — ED Triage Notes (Signed)
Pt to triage via [facility] from [facility]  Reports dizziness since this morning.  Hasn't taken her BP medications x 1 week.  She has her BP medication, states she just stopped taking it.  Dizziness has now resolved.

## 2021-07-17 NOTE — Discharge Instructions (Addendum)
Take your medications as directed

## 2021-07-17 NOTE — Discharge Instructions (Signed)
Patient was sent to the hospital via EMS.  

## 2021-07-17 NOTE — ED Notes (Signed)
The pt to mri  iv bolus  stopped for mri   will complete when she returns to her room

## 2021-07-17 NOTE — ED Provider Notes (Signed)
[facility] EMERGENCY DEPARTMENT Provider Note   CSN: [id number] Arrival date & time: 07/17/21  1401     History Chief Complaint  Patient presents with   Hypertension    Cheryl Anderson is a 87 y.o. female.  Pt presents to the ED today with dizziness.  Pt said Cheryl Anderson woke up and felt normal.  Around noon, Cheryl Anderson felt very dizzy.  Cheryl Anderson initially went to UC who sent her here.  Cheryl Anderson said Cheryl Anderson has not taken any of her meds for the past week.  Cheryl Anderson said Cheryl Anderson has the meds, Cheryl Anderson has just forgotten to take them.  Pt said Cheryl Anderson feels fine now.  Cheryl Anderson denies any f/c.  No cp or sob.      Past Medical History:  Diagnosis Date   BPPV (benign paroxysmal positional vertigo) 03/16/2016   Callus of foot 04/01/2014   CHF (congestive heart failure) (HCC)    Heart disorder    Hyperlipidemia    Hypertension    Leg cramps, sleep related 05/07/2012   Osteoporosis    Plantar fasciitis 04/01/2014   Subcutaneous nodule of chest wall 12/15/2010    Patient Active Problem List   Diagnosis Date Noted   Healthcare maintenance 03/15/2021   Arthritis of right shoulder region 09/01/2020   Back pain 05/25/2019   Constipation 05/14/2019   Seasonal allergies 04/10/2019   Nondisplaced fracture of fifth right metatarsal bone with routine healing 11/06/2018   Encounter for smoking cessation counseling 10/26/2018   CAD (coronary artery disease) 02/08/2018   (HFpEF) heart failure with preserved ejection fraction (HCC) 11/07/2017   Bilateral lower extremity edema 05/24/2017   Hearing loss 03/16/2016   Visual impairment 03/16/2016   Peripheral arterial disease (HCC) 03/13/2015   Osteoarthritis of right knee 02/01/2015   Unspecified vitamin D deficiency 08/20/2013   Osteoporosis, unspecified 07/25/2013   Positive PPD 02/10/2011   Edema 11/27/2009   Hyperlipidemia LDL goal < 160 12/27/2006   TOBACCO USE 12/27/2006   HYPERTENSION, BENIGN ESSENTIAL 12/27/2006   CHRONIC OBSTRUCTIVE PULMONARY DISEASE 12/27/2006    GASTROESOPHAGEAL REFLUX, NO ESOPHAGITIS 12/07/2006    Past Surgical History:  Procedure Laterality Date   No prior surgery       OB History   No obstetric history on file.     Family History  Problem Relation Age of Onset   Cancer Sister    Cancer Brother        Throat CA   Diabetes Daughter     Social History   Tobacco Use   Smoking status: Every Day    Packs/day: 1.00    Years: 69.00    Pack years: 69.00    Types: Cigarettes   Smokeless tobacco: Never  Substance Use Topics   Alcohol use: Yes    Alcohol/week: 0.0 standard drinks    Comment: OCCASIONALLY   Drug use: No    Home Medications Prior to Admission medications   Medication Sig Start Date End Date Taking? Authorizing Provider  aspirin EC 81 MG tablet Take 1 tablet by mouth daily.   Yes [provider]  atorvastatin (LIPITOR) 80 MG tablet TAKE 1 TABLET BY MOUTH EVERY DAY 07/17/20  Yes Welborn, Ryan, DO  brimonidine-timolol (COMBIGAN) 0.2-0.5 % ophthalmic solution Place 1 drop into both eyes every 12 (twelve) hours.   Yes [provider]  fluticasone (FLONASE) 50 MCG/ACT nasal spray SPRAY 2 SPRAYS INTO EACH NOSTRIL EVERY DAY Patient taking differently: Place 1 spray into both nostrils daily as needed for allergies  or rhinitis. 09/06/19  Yes Dana Allan, MD  furosemide (LASIX) 40 MG tablet TAKE 1 TABLET BY MOUTH EVERY DAY 11/02/20  Yes Dana Allan, MD  isosorbide mononitrate (IMDUR) 30 MG 24 hr tablet TAKE 1 TABLET BY MOUTH EVERY DAY Patient taking differently: Take 30 mg by mouth daily. 08/03/20  Yes Dana Allan, MD  ramipril (ALTACE) 5 MG capsule TAKE 1 CAPSULE BY MOUTH EVERY DAY 07/17/20  Yes Jackelyn Poling, DO  risedronate (ACTONEL) 35 MG tablet Take 1 tablet (35 mg total) by mouth every 7 (seven) days. with water on empty stomach, nothing by mouth or lie down for next 30 minutes. 02/12/19  Yes Leland Her, DO  Calcium Carbonate-Vitamin D 600-400 MG-UNIT chew tablet Chew 1 tablet by mouth  2 (two) times daily. Patient not taking: No sig reported 11/07/17   Leland Her, DO  ezetimibe (ZETIA) 10 MG tablet Take 1 tablet (10 mg total) by mouth daily. Patient not taking: No sig reported 02/17/20   Dana Allan, MD  nitroGLYCERIN (NITROSTAT) 0.4 MG SL tablet Take every 5 minutes for 3 doses. If pain not resolved, call 911 or go to ER. Patient not taking: No sig reported 11/02/16   Leland Her, DO    Allergies    Codeine  Review of Systems   Review of Systems  Neurological:  Positive for dizziness.  All other systems reviewed and are negative.  Physical Exam Updated Vital Signs BP (!) 181/63   Pulse 81   Temp 98 F (36.7 C) (Oral)   Resp 16   Ht 5\' 6"  (1.676 m)   Wt 70.8 kg   SpO2 96%   BMI 25.18 kg/m   Physical Exam Vitals and nursing note reviewed.  Constitutional:      Appearance: Normal appearance.  HENT:     Head: Normocephalic and atraumatic.     Right Ear: External ear normal.     Left Ear: External ear normal.     Nose: Nose normal.     Mouth/Throat:     Mouth: Mucous membranes are moist.     Pharynx: Oropharynx is clear.  Eyes:     Extraocular Movements: Extraocular movements intact.     Conjunctiva/sclera: Conjunctivae normal.     Pupils: Pupils are equal, round, and reactive to light.  Cardiovascular:     Rate and Rhythm: Normal rate and regular rhythm.     Pulses: Normal pulses.     Heart sounds: Normal heart sounds.  Pulmonary:     Effort: Pulmonary effort is normal.     Breath sounds: Normal breath sounds.  Abdominal:     General: Abdomen is flat. Bowel sounds are normal.     Palpations: Abdomen is soft.  Musculoskeletal:        General: Normal range of motion.     Cervical back: Normal range of motion and neck supple.  Skin:    General: Skin is warm.     Capillary Refill: Capillary refill takes less than 2 seconds.  Neurological:     General: No focal deficit present.     Mental Status: Cheryl Anderson is alert and oriented to person, place,  and time.  Psychiatric:        Mood and Affect: Mood normal.        Behavior: Behavior normal.    ED Results / Procedures / Treatments   Labs (all labs ordered are listed, but only abnormal results are displayed) Labs Reviewed  BASIC METABOLIC PANEL - Abnormal;  Notable for the following components:      Result Value   GFR, Estimated 58 (*)    All other components within normal limits  URINALYSIS, ROUTINE W REFLEX MICROSCOPIC - Abnormal; Notable for the following components:   Color, Urine STRAW (*)    All other components within normal limits  CBC  TROPONIN I (HIGH SENSITIVITY)  TROPONIN I (HIGH SENSITIVITY)    EKG None  Radiology DG Chest 2 View  Result Date: 07/17/2021 CLINICAL DATA:  Chest pain EXAM: CHEST - 2 VIEW COMPARISON:  None. FINDINGS: The heart size and mediastinal contours are within normal limits. Both lungs are clear. The visualized skeletal structures are unremarkable. IMPRESSION: No active cardiopulmonary disease. Electronically Signed   By: Ted Mcalpine M.D.   On: 07/17/2021 14:53   CT Head Wo Contrast  Result Date: 07/17/2021 CLINICAL DATA:  Headache, dizziness, hypertension EXAM: CT HEAD WITHOUT CONTRAST TECHNIQUE: Contiguous axial images were obtained from the base of the skull through the vertex without intravenous contrast. COMPARISON:  06/24/2005 FINDINGS: Brain: No acute infarct or hemorrhage. Scattered hypodensities within the periventricular white matter consistent with chronic small vessel ischemic changes. Lateral ventricles and midline structures are otherwise unremarkable. No acute extra-axial fluid collections. No mass effect. Vascular: Mild atherosclerosis.  No hyperdense vessel. Skull: Normal. Negative for fracture or focal lesion. Sinuses/Orbits: No acute finding. Other: None. IMPRESSION: 1. No acute intracranial process. Electronically Signed   By: Sharlet Salina M.D.   On: 07/17/2021 18:49   MR BRAIN WO CONTRAST  Result Date:  07/17/2021 CLINICAL DATA:  Acute neurologic deficit EXAM: MRI HEAD WITHOUT CONTRAST TECHNIQUE: Multiplanar, multiecho pulse sequences of the brain and surrounding structures were obtained without intravenous contrast. COMPARISON:  09/10/2018 FINDINGS: Brain: No acute infarct, mass effect or extra-axial collection. No acute or chronic hemorrhage. Hyperintense T2-weighted signal is moderately widespread throughout the white matter. Generalized volume loss without a clear lobar predilection. The midline structures are normal. Vascular: Major flow voids are preserved. Skull and upper cervical spine: Normal calvarium and skull base. Visualized upper cervical spine and soft tissues are normal. Sinuses/Orbits:No paranasal sinus fluid levels or advanced mucosal thickening. No mastoid or middle ear effusion. Normal orbits. IMPRESSION: 1. No acute intracranial abnormality. 2. Generalized volume loss and findings of chronic small vessel ischemia. Electronically Signed   By: Deatra Robinson M.D.   On: 07/17/2021 22:56    Procedures Procedures   Medications Ordered in ED Medications  hydrALAZINE (APRESOLINE) injection 20 mg (20 mg Intravenous Given 07/17/21 1902)  sodium chloride 0.9 % bolus 500 mL (500 mLs Intravenous New Bag/Given 07/17/21 2139)    ED Course  I have reviewed the triage vital signs and the nursing notes.  Pertinent labs & imaging results that were available during my care of the patient were reviewed by me and considered in my medical decision making (see chart for details).    MDM Rules/Calculators/A&P                           Pt's sx are likely due to htn.  I did give her 20 mg of hydralazine and it did drop her bp.  MRI nl and work up is negative.  Pt able to ambulate down the hall.  The pt lives with her daughter.  Her daughter will check to make sure that pt is taking her meds.  Pt is stable for d/c.  Cheryl Anderson is to f/u with her pcp.  Return if  worse. Final Clinical Impression(s) / ED  Diagnoses Final diagnoses:  Hypertension, unspecified type    Rx / DC Orders ED Discharge Orders     None        Jacalyn Lefevre, MD 07/17/21 2308

## 2021-07-17 NOTE — ED Triage Notes (Signed)
Pt c/o dizziness, abdominal pain (periumbilical area, transient). States does not always take medications as prescribed.

## 2021-07-17 NOTE — ED Notes (Signed)
Ems called per provider  

## 2021-07-19 ENCOUNTER — Telehealth: Payer: Self-pay

## 2021-07-19 NOTE — Telephone Encounter (Signed)
Received phone call from NP with House calls. NP reports abnormal PAD screening in both legs.   She also reports elevated BP at 178/58. Patient is asymptomatic.   Attempted to call patient. No answer, left VM for patient to return call to office to schedule for follow up.   Veronda Prude, RN

## 2021-07-29 ENCOUNTER — Ambulatory Visit (INDEPENDENT_AMBULATORY_CARE_PROVIDER_SITE_OTHER): Payer: Medicare Other | Admitting: Family Medicine

## 2021-07-29 ENCOUNTER — Other Ambulatory Visit: Payer: Self-pay

## 2021-07-29 VITALS — BP 131/80 | HR 60 | Wt 146.2 lb

## 2021-07-29 DIAGNOSIS — F17200 Nicotine dependence, unspecified, uncomplicated: Secondary | ICD-10-CM | POA: Diagnosis not present

## 2021-07-29 DIAGNOSIS — I10 Essential (primary) hypertension: Secondary | ICD-10-CM | POA: Diagnosis not present

## 2021-07-29 DIAGNOSIS — I5032 Chronic diastolic (congestive) heart failure: Secondary | ICD-10-CM | POA: Diagnosis not present

## 2021-07-29 DIAGNOSIS — E785 Hyperlipidemia, unspecified: Secondary | ICD-10-CM | POA: Diagnosis not present

## 2021-07-29 DIAGNOSIS — I739 Peripheral vascular disease, unspecified: Secondary | ICD-10-CM

## 2021-07-29 MED ORDER — FUROSEMIDE 40 MG PO TABS: 40.0000 mg | ORAL_TABLET | Freq: Every day | ORAL | 1 refills | Status: DC

## 2021-07-29 MED ORDER — ATORVASTATIN CALCIUM 80 MG PO TABS: 80.0000 mg | ORAL_TABLET | Freq: Every day | ORAL | 3 refills | Status: DC

## 2021-07-29 NOTE — Patient Instructions (Addendum)
It was nice seeing you today!  Blood pressure looks good today.  We will check your cholesterol levels today. I will plan to send a letter in the mail with results.  Please call if you are having a lot of high blood pressure readings > 150/90.  Please arrive at least 15 minutes prior to your scheduled appointments.  Stay well, Littie Deeds, MD [facility] [phone]

## 2021-07-29 NOTE — Progress Notes (Signed)
    SUBJECTIVE:   CHIEF COMPLAINT / HPI: ED follow-up  Recently seen in urgent care, then sent to the ED on 10/8 for severe hypertension.  She had presented with dizziness and had not taken her medications for the past week.  Work-up, including MRI brain, CT head, CXR, troponins were negative.  Initial BP 208/93, given hydralazine with improvement.  Per phone note 10/10, NP with housecalls reported abnormal PAD screening in both legs.  Per chart review, she had ABIs done in 2016 with borderline arterial insufficiency and monophasic flow. She continues to smoke less than 1 ppd.  She has chronic pain in her bilateral legs which is not worsening.  Medications include ramipril 5 mg, furosemide 40 mg daily. Has not been checking BP at home. She has been adherent with her medications and her family members have been helping to make sure she takes them. Daughter asking about BP watch.  Also asking about getting cholesterol checked.   PERTINENT  PMH / PSH: HTN, HFpEF, CAD, PAD, COPD, tobacco use  OBJECTIVE:   BP 131/80   Pulse 60   Wt 146 lb 3.2 oz (66.3 kg)   SpO2 98%   BMI 23.60 kg/m   General: Elderly female, NAD CV: RRR, no murmurs Pulm: CTAB, no wheezes or rales  ASSESSMENT/PLAN:   Hyperlipidemia with target low density lipoprotein (LDL) cholesterol less than 160 mg/dL Repeat lipid panel today per patient request.  She is on high-dose statin but has not taken this for months.  Statin refilled today.  Ezetimibe discontinued as she had not been taking this.  TOBACCO USE Currently smoking less than 1 pack/day.  Recommended cessation but patient is not interested at this time.  HYPERTENSION, BENIGN ESSENTIAL Appears to be controlled now that she is compliant with medications.  Recommended to monitor BP at home more regularly.  Discussed that wrist cuffs are less accurate than arm cuffs but any BP monitoring is better than none.  Peripheral arterial disease (HCC) Borderline  arterial insufficiency and monophasic flow on ABI 2016.  She is already on a statin and aspirin.  Recommended smoking cessation.     Littie Deeds, MD [facility]

## 2021-07-29 NOTE — Assessment & Plan Note (Signed)
Currently smoking less than 1 pack/day.  Recommended cessation but patient is not interested at this time.

## 2021-07-29 NOTE — Assessment & Plan Note (Signed)
Borderline arterial insufficiency and monophasic flow on ABI 2016.  She is already on a statin and aspirin.  Recommended smoking cessation.

## 2021-07-29 NOTE — Assessment & Plan Note (Signed)
Appears to be controlled now that she is compliant with medications.  Recommended to monitor BP at home more regularly.  Discussed that wrist cuffs are less accurate than arm cuffs but any BP monitoring is better than none.

## 2021-07-29 NOTE — Assessment & Plan Note (Signed)
Repeat lipid panel today per patient request.  She is on high-dose statin but has not taken this for months.  Statin refilled today.  Ezetimibe discontinued as she had not been taking this.

## 2021-07-30 ENCOUNTER — Encounter: Payer: Self-pay | Admitting: Family Medicine

## 2021-07-30 LAB — LIPID PANEL
Chol/HDL Ratio: 4 ratio (ref 0.0–4.4)
Cholesterol, Total: 234 mg/dL — ABNORMAL HIGH (ref 100–199)
HDL: 58 mg/dL (ref >39)
LDL Chol Calc (NIH): 165 mg/dL — ABNORMAL HIGH (ref 0–99)
Triglycerides: 65 mg/dL (ref 0–149)
VLDL Cholesterol Cal: 11 mg/dL (ref 5–40)

## 2021-08-22 ENCOUNTER — Emergency Department (HOSPITAL_COMMUNITY): Payer: Medicare Other

## 2021-08-22 ENCOUNTER — Emergency Department (HOSPITAL_COMMUNITY)
Admission: EM | Admit: 2021-08-22 | Discharge: 2021-08-22 | Disposition: A | Discharge status: Home / Self Care | Payer: Medicare Other | Attending: Emergency Medicine | Admitting: Emergency Medicine

## 2021-08-22 ENCOUNTER — Other Ambulatory Visit: Payer: Self-pay

## 2021-08-22 DIAGNOSIS — W01198A Fall on same level from slipping, tripping and stumbling with subsequent striking against other object, initial encounter: Secondary | ICD-10-CM | POA: Diagnosis not present

## 2021-08-22 DIAGNOSIS — Z5321 Procedure and treatment not carried out due to patient leaving prior to being seen by health care provider: Secondary | ICD-10-CM | POA: Insufficient documentation

## 2021-08-22 DIAGNOSIS — Y92000 Kitchen of unspecified non-institutional (private) residence as  the place of occurrence of the external cause: Secondary | ICD-10-CM | POA: Insufficient documentation

## 2021-08-22 DIAGNOSIS — S0990XA Unspecified injury of head, initial encounter: Secondary | ICD-10-CM | POA: Diagnosis present

## 2021-08-22 DIAGNOSIS — S0191XA Laceration without foreign body of unspecified part of head, initial encounter: Secondary | ICD-10-CM | POA: Insufficient documentation

## 2021-08-22 LAB — CBC WITH DIFFERENTIAL/PLATELET
Abs Immature Granulocytes: 0.02 10*3/uL (ref 0.00–0.07)
Basophils Absolute: 0 10*3/uL (ref 0.0–0.1)
Basophils Relative: 1 %
Eosinophils Absolute: 0.1 10*3/uL (ref 0.0–0.5)
Eosinophils Relative: 2 %
HCT: 41 % (ref 36.0–46.0)
Hemoglobin: 13.2 g/dL (ref 12.0–15.0)
Immature Granulocytes: 0 %
Lymphocytes Relative: 34 %
Lymphs Abs: 2 10*3/uL (ref 0.7–4.0)
MCH: 30.8 pg (ref 26.0–34.0)
MCHC: 32.2 g/dL (ref 30.0–36.0)
MCV: 95.8 fL (ref 80.0–100.0)
Monocytes Absolute: 0.5 10*3/uL (ref 0.1–1.0)
Monocytes Relative: 8 %
Neutro Abs: 3.3 10*3/uL (ref 1.7–7.7)
Neutrophils Relative %: 55 %
Platelets: 264 10*3/uL (ref 150–400)
RBC: 4.28 MIL/uL (ref 3.87–5.11)
RDW: 13.3 % (ref 11.5–15.5)
WBC: 6 10*3/uL (ref 4.0–10.5)
nRBC: 0 % (ref 0.0–0.2)

## 2021-08-22 LAB — URINALYSIS, ROUTINE W REFLEX MICROSCOPIC
Bilirubin Urine: NEGATIVE
Glucose, UA: NEGATIVE mg/dL
Hgb urine dipstick: NEGATIVE
Ketones, ur: NEGATIVE mg/dL
Nitrite: NEGATIVE
Protein, ur: NEGATIVE mg/dL
Specific Gravity, Urine: 1.016 (ref 1.005–1.030)
pH: 5 (ref 5.0–8.0)

## 2021-08-22 LAB — BASIC METABOLIC PANEL
Anion gap: 7 (ref 5–15)
BUN: 26 mg/dL — ABNORMAL HIGH (ref 8–23)
CO2: 30 mmol/L (ref 22–32)
Calcium: 9 mg/dL (ref 8.9–10.3)
Chloride: 101 mmol/L (ref 98–111)
Creatinine, Ser: 1.23 mg/dL — ABNORMAL HIGH (ref 0.44–1.00)
GFR, Estimated: 42 mL/min — ABNORMAL LOW (ref >60)
Glucose, Bld: 115 mg/dL — ABNORMAL HIGH (ref 70–99)
Potassium: 4.3 mmol/L (ref 3.5–5.1)
Sodium: 138 mmol/L (ref 135–145)

## 2021-08-22 LAB — CBG MONITORING, ED: Glucose-Capillary: 120 mg/dL — ABNORMAL HIGH (ref 70–99)

## 2021-08-22 NOTE — ED Provider Notes (Signed)
Emergency Medicine Provider Triage Evaluation Note  Cheryl Anderson , a 87 y.o. female  was evaluated in triage.  Pt complains of unwitnessed fall at home. Reports she "blacked out" causing her to fall in the kitchen. Hit head on hutch and has since had a constant headache in her frontal region and L temple. No medications PTA. Not chronically anticoagulated. Denies any associated CP, SOB, vomiting, urinary symptoms. No recent changes to her daily medications.  Review of Systems  Positive: Fall, laceration Negative: As above  Physical Exam  BP (!) 161/75   Pulse 61   Temp 98 F (36.7 C) (Oral)   Resp 12   SpO2 99%  Gen:   Awake, no distress   Resp:  Normal effort MSK:   Moves extremities without difficulty  Other:  Well approximated laceration to lateral L eyebrow; associated hematoma. No gross focal deficits. Moving all extremities purposefully.  Medical Decision Making  Medically screening exam initiated at 12:47 AM.  Appropriate orders placed.  [name] was informed that the remainder of the evaluation will be completed by another provider, this initial triage assessment does not replace that evaluation, and the importance of remaining in the ED until their evaluation is complete.  Headache, laceration s/p fall - plan for head CT. Fall in the setting of syncope. Basic labs, UA, EKG ordered.   Antony Madura, PA-C 08/22/21 4097    Shon Baton, MD 08/22/21 [phone]

## 2021-08-22 NOTE — ED Triage Notes (Signed)
Pt states around 11:20 tonight she "blacked out" and fell forward hitting her head on the hutch.  Family came in and helped her up and that is when the patient states she came to.  Pt is not on blood thinners.

## 2021-08-22 NOTE — ED Notes (Signed)
Pt's family informed this tech that they wish not to wait any longer and will be leaving at this time.

## 2021-08-30 ENCOUNTER — Other Ambulatory Visit: Payer: Self-pay

## 2021-08-30 ENCOUNTER — Encounter: Payer: Self-pay | Admitting: Family Medicine

## 2021-08-30 ENCOUNTER — Ambulatory Visit (INDEPENDENT_AMBULATORY_CARE_PROVIDER_SITE_OTHER): Payer: Medicare Other | Admitting: Family Medicine

## 2021-08-30 VITALS — BP 127/59 | HR 105 | Ht 66.0 in | Wt 148.0 lb

## 2021-08-30 DIAGNOSIS — R55 Syncope and collapse: Secondary | ICD-10-CM

## 2021-08-30 NOTE — Patient Instructions (Addendum)
It was wonderful to see you today.  Please bring ALL of your medications with you to every visit.   Today we talked about:  - For your passing out, lets stop your Lasix and repeat your lab work today - Weigh yourself daily, if you gain > 3lbs in a day or 5 lbs in 2 days please restart your lasix and call me, or if you are getting short of breath - I have ordered a cardiac event monitor and an ultrasound of your heart, will send you a message with that appointment - Please call your heart doctor to be seen by him as well - Schedule f/u in 1 month, and I will call you as results come abck - If you have seizure like activity, pass out again, chest pain or heart racing, trouble breathing, go to ED   Thank you for choosing [facility].   Please call [phone] with any questions about today's appointment.  Please be sure to schedule follow up at the front  desk before you leave today.   Burley Saver, MD  Family Medicine

## 2021-08-30 NOTE — Progress Notes (Signed)
O   SUBJECTIVE:   CHIEF COMPLAINT / HPI:   Syncope-she notes last Saturday she had an episode of complete syncope where she fell and hit her head against a [place] [place] this was witnessed by her daughter.  She notes she had been walking to the kitchen to get some ice cream and then walking back when she felt "swimmy headed," and then was standing by a chair and fell.  She denied any preceding sweating, chest pain, palpitations, shortness of breath.  Family did not notice any twitching or seizure-like activity.  Since she fell she had a black eye and a small laceration over her left eyebrow that have now resolved and healed.  She denies any numbness or weakness or vision changes.  She notes she always has some mild swelling in her leg and this is what she takes her Lasix for.  She has never had anything like this before.  Went to the ER on 11/13 when this happened and had a negative CT head, lab work noted for mildly elevated creatinine of 1.23 with a baseline around 1, no electrolyte abnormalities. She had an EKG in the ED that showed sinus rhythm with premature supraventricular complexes, no ST segment changes or T wave inversion.  PERTINENT  PMH / PSH: CAD, HFpEF, COPD, HTN  OBJECTIVE:   BP (!) 127/59   Pulse (!) 105   Ht 5\' 6"  (1.676 m)   Wt 148 lb (67.1 kg)   SpO2 100%   BMI 23.89 kg/m   General: A&O, NAD HEENT: No sign of trauma, EOM grossly intact Cardiac: RRR, no m/r/g Respiratory: CTAB, normal WOB, no w/c/r GI: Soft, NTTP, non-distended  Extremities: NTTP, no peripheral edema. Neuro: Normal gait, moves all four extremities appropriately. Psych: Appropriate mood and affect   ASSESSMENT/PLAN:   Syncope - Unclear cause, patient neurovascularly intact today without any focal neurodeficits, seizure less likely due to no witnessed activity - Orthostatic vitals today positive, with blood pressure while lying of 142/68 and heart rate 50 bpm, dropping to a standing blood pressure of 119/67  with a heart rate of 63 bpm.  With shared decision making discussed that patient has never had shortness of breath or heart failure exacerbation, and was started on her Lasix for her edema.  Agreement made to trial discontinuing the Lasix and she will weigh herself daily, if weight gain greater than 3 pounds in a day or 5 pounds in 2 days she is to restart her Lasix and give a call - She is going to call her cardiologist to make an appointment as well, in the meantime I have ordered a 14-day event monitor in case of arrhythmia contributing to this as she does have premature complexes on her EKG, as well as a repeat echo as she has a history of heart failure with preserved ejection fraction has not had echo since 2019 -Strict ED return precautions discussed - We will repeat BMP today to ensure creatinine and electrolytes stable     2020, MD [facility]

## 2021-08-30 NOTE — Assessment & Plan Note (Signed)
-   Unclear cause, patient neurovascularly intact today without any focal neurodeficits, seizure less likely due to no witnessed activity - Orthostatic vitals today positive, with blood pressure while lying of 142/68 and heart rate 50 bpm, dropping to a standing blood pressure of 119/67 with a heart rate of 63 bpm.  With shared decision making discussed that patient has never had shortness of breath or heart failure exacerbation, and was started on her Lasix for her edema.  Agreement made to trial discontinuing the Lasix and she will weigh herself daily, if weight gain greater than 3 pounds in a day or 5 pounds in 2 days she is to restart her Lasix and give [place] a call - She is going to call her cardiologist to make an appointment as well, in the meantime I have ordered a 14-day event monitor in case of arrhythmia contributing to this as she does have premature complexes on her EKG, as well as a repeat echo as she has a history of heart failure with preserved ejection fraction has not had echo since 2019 -Strict ED return precautions discussed - We will repeat BMP today to ensure creatinine and electrolytes stable

## 2021-08-31 LAB — BASIC METABOLIC PANEL
BUN/Creatinine Ratio: 18 (ref 12–28)
BUN: 20 mg/dL (ref 10–36)
CO2: 29 mmol/L (ref 20–29)
Calcium: 9.4 mg/dL (ref 8.7–10.3)
Chloride: 104 mmol/L (ref 96–106)
Creatinine, Ser: 1.13 mg/dL — ABNORMAL HIGH (ref 0.57–1.00)
Glucose: 92 mg/dL (ref 70–99)
Potassium: 5.2 mmol/L (ref 3.5–5.2)
Sodium: 142 mmol/L (ref 134–144)
eGFR: 46 mL/min/{1.73_m2} — ABNORMAL LOW (ref >59)

## 2021-09-24 ENCOUNTER — Other Ambulatory Visit: Payer: Self-pay | Admitting: Family Medicine

## 2021-09-24 DIAGNOSIS — I10 Essential (primary) hypertension: Secondary | ICD-10-CM

## 2021-10-27 ENCOUNTER — Ambulatory Visit (INDEPENDENT_AMBULATORY_CARE_PROVIDER_SITE_OTHER): Payer: Medicare Other | Admitting: Family Medicine

## 2021-10-27 ENCOUNTER — Ambulatory Visit
Admission: RE | Admit: 2021-10-27 | Discharge: 2021-10-27 | Disposition: A | Discharge status: Home / Self Care | Payer: Medicare Other | Source: Ambulatory Visit | Attending: Family Medicine | Admitting: Family Medicine

## 2021-10-27 ENCOUNTER — Other Ambulatory Visit: Payer: Self-pay

## 2021-10-27 VITALS — BP 200/68 | HR 45 | Temp 97.7°F | Wt 146.8 lb

## 2021-10-27 DIAGNOSIS — M25552 Pain in left hip: Secondary | ICD-10-CM

## 2021-10-27 DIAGNOSIS — I10 Essential (primary) hypertension: Secondary | ICD-10-CM

## 2021-10-27 MED ORDER — RAMIPRIL 10 MG PO CAPS: 10.0000 mg | ORAL_CAPSULE | Freq: Every day | ORAL | 3 refills | Status: DC

## 2021-10-27 NOTE — Progress Notes (Signed)
° ° ° °  SUBJECTIVE:   CHIEF COMPLAINT / HPI:   Cheryl Anderson is a 88 y.o. female presents for hip pain  Hip pain  Left hip pain started 2 weeks ago. Onset was gradual. Radiates down to buttock. Described as sharp type of pain. Constant in nature. Alleviated by resting. Exacerbated by walking and laying on that side. Did have a fall a few months ago. Denies weakness, parasthesia, numbness, myalgia, other joint pains, fevers etc etc   Hypertension Patient's current antihypertensive  medications include: ramipril 5mg , lasix 40mg . Daughter reports her diet has been off over holidays and she has a high salt diet. Compliant with medications and tolerating well without side effects.  Checking BP at home with readings between 168 and 188. Her normal BP  Denies any SOB, CP, vision changes, LE edema, medication SEs, or symptoms of hypotension. Still smokes 2/3 of a pack a day.   Most recent creatinine trend:  Lab Results  Component Value Date   CREATININE 1.13 (H) 08/30/2021   CREATININE 1.23 (H) 08/22/2021   CREATININE 0.93 07/17/2021    Patient has had a BMP in the past 1 year.  Flowsheet Row Office Visit from 07/29/2021 in [facility]  PHQ-9 Total Score 0         PERTINENT  PMH / PSH: HTN, PAD, CHF  OBJECTIVE:   BP (!) 200/68    Pulse (!) 45    Temp 97.7 F (36.5 C)    Wt 146 lb 12.8 oz (66.6 kg)    SpO2 100%    BMI 23.69 kg/m    General: Alert, no acute distress, well appearing Cardio: Normal S1 and S2, RRR, no r/m/g Pulm: CTAB, normal work of breathing Abdomen: Bowel sounds normal. Abdomen soft and non-tender.  Extremities: No peripheral edema.  Neuro: Cranial nerves grossly intact   Hip:  - Inspection: No gross deformity, no swelling, erythema, or ecchymosis - Palpation: TTP over left hip joint  - ROM: Normal range of motion on Flexion, extension, abduction, internal and external rotation - Strength: Normal strength. - Neuro/vasc: NV intact  distally   ASSESSMENT/PLAN:   HYPERTENSION, BENIGN ESSENTIAL Severe HTN, uncontrolled. BP 187/69. On recheck >200/68. Pt has been asymptomatic with this BP. Last doses of anti-hypertensive were yesterday morning/early afternoon. She has also missed her medications today due to coming to clinic. BP readings at home over the holidays have been 160-180s and she has also had a high sodium diet over this time.  Recommended pt increases lisinopril 10mg  and returns for RN visit tomorrow. If still elevated >160 systolic pt will need another antihypertensive. Strict ER precautions given to pt and her daughter who expressed understanding.    Left hip pain Left hip pain likely 2/2 OA. Could also have soft tissue injury after fall a few months ago. Fracture unlikely. No obvious deformity on exam. Recommended left hip xray, tylenol and diclofenac gel. Follow up with PCP for the results.     07/31/2021, MD PGY-3 [facility]

## 2021-10-27 NOTE — Assessment & Plan Note (Addendum)
Left hip pain likely 2/2 OA. Could also have soft tissue injury after fall a few months ago. Fracture unlikely. No obvious deformity on exam. Recommended left hip xray, tylenol and diclofenac gel. Follow up with PCP for the results.

## 2021-10-27 NOTE — Patient Instructions (Addendum)
Thank you for coming to see me today. It was a pleasure. Today we discussed your left hip. It could be arthritis pain. Unlikely to be a fracture I recommend hip xray, tylenol and diclofenac gel to the area.   I have placed an order for left hip xray.  Please go to [facility] at [employer] or at [facility] to have this completed.  You do not need an appointment, but if you would like to call them beforehand, their number is [phone].  We will contact you with your results afterwards.    Your BP is very high, please increase ramipril dose to 10mg today. I am sending a new prescription. Come back to nurse clinic tomorrow for recheck. If it still high then we need to add another medication.  Please follow-up with PCP with hip xray results and BP follow up   If you have any questions or concerns, please do not hesitate to call the office at [phone].  Best wishes,   Dr [phone]

## 2021-10-27 NOTE — Assessment & Plan Note (Addendum)
Severe HTN, uncontrolled. BP 187/69. On recheck >200/68. Pt has been asymptomatic with this BP. Last doses of anti-hypertensive were yesterday morning/early afternoon. She has also missed her medications today due to coming to clinic. BP readings at home over the holidays have been 160-180s and she has also had a high sodium diet over this time.  Recommended pt increases lisinopril 10mg  and returns for RN visit tomorrow. If still elevated [id number] systolic pt will need another antihypertensive. Strict ER precautions given to pt and her daughter who expressed understanding.

## 2021-10-28 ENCOUNTER — Ambulatory Visit (INDEPENDENT_AMBULATORY_CARE_PROVIDER_SITE_OTHER): Payer: Medicare Other

## 2021-10-28 DIAGNOSIS — I10 Essential (primary) hypertension: Secondary | ICD-10-CM

## 2021-10-28 LAB — BASIC METABOLIC PANEL
BUN/Creatinine Ratio: 19 (ref 12–28)
BUN: 20 mg/dL (ref 10–36)
CO2: 20 mmol/L (ref 20–29)
Calcium: 9 mg/dL (ref 8.7–10.3)
Chloride: 102 mmol/L (ref 96–106)
Creatinine, Ser: 1.07 mg/dL — ABNORMAL HIGH (ref 0.57–1.00)
Glucose: 89 mg/dL (ref 70–99)
Potassium: 4.6 mmol/L (ref 3.5–5.2)
Sodium: 141 mmol/L (ref 134–144)
eGFR: 49 mL/min/{1.73_m2} — ABNORMAL LOW (ref >59)

## 2021-10-28 NOTE — Progress Notes (Signed)
Patient here today for BP check.      Last BP was on 10/27/2021 and was 187/69, 200/68.  BP today is 116/60 (manual in left arm), 115/53 (automatic in left arm) with a pulse of 56.    Checked BP in left arm with regular adult cuff.    Symptoms present: none.   Daughter reports that patient went home yesterday and took BP medications. Shortly after, BP had normalized to 120's/70's. Patient did not increase ramipril dosage.      Spoke with Dr. Leveda Anna regarding BP in office today. Recommended that patient not increase ramipril and continue current medication regimen.    Veronda Prude, RN

## 2021-11-08 ENCOUNTER — Encounter: Payer: Self-pay | Admitting: Family Medicine

## 2021-11-09 ENCOUNTER — Other Ambulatory Visit: Payer: Self-pay | Admitting: Family Medicine

## 2021-11-09 DIAGNOSIS — I10 Essential (primary) hypertension: Secondary | ICD-10-CM

## 2021-11-09 MED ORDER — RAMIPRIL 5 MG PO CAPS: ORAL_CAPSULE | ORAL | 1 refills | Status: DC

## 2021-11-09 NOTE — Telephone Encounter (Signed)
Discontinued Ramipril 10 mg. Resent in prescription for Ramipril 5 mg daily.  Please schedule patient for follow up BP check in 1 week in nurse clinic.  Thank you

## 2021-11-16 ENCOUNTER — Other Ambulatory Visit: Payer: Self-pay

## 2021-11-16 ENCOUNTER — Ambulatory Visit (HOSPITAL_COMMUNITY)
Admission: RE | Admit: 2021-11-16 | Discharge: 2021-11-16 | Disposition: A | Discharge status: Home / Self Care | Payer: Medicare Other | Source: Ambulatory Visit | Attending: Family Medicine | Admitting: Family Medicine

## 2021-11-16 DIAGNOSIS — R55 Syncope and collapse: Secondary | ICD-10-CM | POA: Insufficient documentation

## 2021-11-16 DIAGNOSIS — I10 Essential (primary) hypertension: Secondary | ICD-10-CM | POA: Diagnosis not present

## 2021-11-16 DIAGNOSIS — E785 Hyperlipidemia, unspecified: Secondary | ICD-10-CM | POA: Insufficient documentation

## 2021-11-16 LAB — ECHOCARDIOGRAM COMPLETE
Area-P 1/2: 3.89 cm2
S' Lateral: 2.2 cm

## 2021-11-22 NOTE — Progress Notes (Signed)
Thank you :)

## 2021-11-22 NOTE — Progress Notes (Signed)
Wasn't sure if you seen these results.

## 2021-12-10 ENCOUNTER — Other Ambulatory Visit: Payer: Self-pay | Admitting: Family Medicine

## 2021-12-10 DIAGNOSIS — I5032 Chronic diastolic (congestive) heart failure: Secondary | ICD-10-CM

## 2022-03-15 ENCOUNTER — Encounter: Payer: Self-pay | Admitting: *Deleted

## 2022-03-28 ENCOUNTER — Other Ambulatory Visit: Payer: Self-pay | Admitting: Family Medicine

## 2022-03-28 DIAGNOSIS — I10 Essential (primary) hypertension: Secondary | ICD-10-CM

## 2022-04-22 ENCOUNTER — Other Ambulatory Visit: Payer: Self-pay

## 2022-04-22 ENCOUNTER — Ambulatory Visit (HOSPITAL_COMMUNITY): Payer: Medicare Other | Attending: Family Medicine

## 2022-04-22 ENCOUNTER — Ambulatory Visit (INDEPENDENT_AMBULATORY_CARE_PROVIDER_SITE_OTHER): Payer: Medicare Other | Admitting: Family Medicine

## 2022-04-22 ENCOUNTER — Encounter: Payer: Self-pay | Admitting: Family Medicine

## 2022-04-22 VITALS — BP 124/68 | HR 74 | Wt 142.0 lb

## 2022-04-22 DIAGNOSIS — I5032 Chronic diastolic (congestive) heart failure: Secondary | ICD-10-CM

## 2022-04-22 DIAGNOSIS — R609 Edema, unspecified: Secondary | ICD-10-CM

## 2022-04-22 DIAGNOSIS — I499 Cardiac arrhythmia, unspecified: Secondary | ICD-10-CM

## 2022-04-22 NOTE — Patient Instructions (Signed)
Thank you for coming to see me today. It was a pleasure. Today we discussed your lower leg swelling, it could be due to your heart, please take your lasix 40mg  twice a day. If you have any difficulty breathing, chest pain, dizziness then please go to the ER immediately.  Use compression stockings.   We will get some labs today.  If they are abnormal or we need to do something about them, I will call you.  If they are normal, I will send you a message on MyChart (if it is active) or a letter in the mail.  If you don't hear from in 2 weeks, please call the office at the number below.   Please follow-up with me in 4 days   if you have any concerns, please do not hesitate to call the office at [phone].  Best wishes,   Dr [phone]

## 2022-04-22 NOTE — Progress Notes (Signed)
     SUBJECTIVE:   CHIEF COMPLAINT / HPI:   Cheryl Anderson is a 88 y.o. female presents for follow up  Daughter present at visit  Leg swelling  Patient reports worsening of bilateral leg swelling over the last few months. Takes lasix 40mg  for HFpEF daily.  Denies rashes or insect bites to lower extremities. Denies chest pain, palpitations, dizziness, cough, dyspnea or fevers.  Uses compression stockings sometimes.  No history of thyroid disorder.  No recent travel.  Flowsheet Row Office Visit from 07/29/2021 in [facility]  PHQ-9 Total Score 0       Health Maintenance Due  Topic   Zoster Vaccines- Shingrix (1 of 2)   COVID-19 Vaccine (4 - Pfizer series)    PERTINENT  PMH / PSH: HFpEF,  PAD, HTN, CAD  OBJECTIVE:   BP 124/68   Pulse 74   Wt 142 lb (64.4 kg)   SpO2 98%   BMI 22.92 kg/m    General: Alert, no acute distress Cardio: Normal S1 and S2, RRR, no r/m/g, elevated JVD  Pulm: CTAB, normal work of breathing Abdomen: Bowel sounds normal. Abdomen soft and non-tender.  Extremities: mild 2 + pitting edema  Neuro: Cranial nerves grossly intact   ASSESSMENT/PLAN:   (HFpEF) heart failure with preserved ejection fraction (HCC) Likely mild worsening of chronic CHF with chronic venous insufficiency. Last echo was 11/16/21 showing EF 60-65% and G1DD. Doubt other organic cause of her worsening edema. She usually takes lasix 40mg  once daily with a good diuretic response. Obtained BMP, BNP and TSH today. EKG obtained to check rhythm as I was initially concerned for arrthymia on examination, however pt is in sinus rhythm. Recommended pt increases her lasix to 40mg  BID and follows up with me in 4 days. Also recommend she continues wearing compression stockings. Return precautions provided to pt and her daughter who expressed understanding.    01/14/22, MD PGY-3 [facility]

## 2022-04-23 LAB — COMPREHENSIVE METABOLIC PANEL
ALT: 10 IU/L (ref 0–32)
AST: 11 IU/L (ref 0–40)
Albumin/Globulin Ratio: 1.7 (ref 1.2–2.2)
Albumin: 3.9 g/dL (ref 3.6–4.6)
Alkaline Phosphatase: 63 IU/L (ref 44–121)
BUN/Creatinine Ratio: 20 (ref 12–28)
BUN: 23 mg/dL (ref 10–36)
Bilirubin Total: 0.4 mg/dL (ref 0.0–1.2)
CO2: 25 mmol/L (ref 20–29)
Calcium: 9.1 mg/dL (ref 8.7–10.3)
Chloride: 106 mmol/L (ref 96–106)
Creatinine, Ser: 1.15 mg/dL — ABNORMAL HIGH (ref 0.57–1.00)
Globulin, Total: 2.3 g/dL (ref 1.5–4.5)
Glucose: 93 mg/dL (ref 70–99)
Potassium: 4.5 mmol/L (ref 3.5–5.2)
Sodium: 143 mmol/L (ref 134–144)
Total Protein: 6.2 g/dL (ref 6.0–8.5)
eGFR: 45 mL/min/{1.73_m2} — ABNORMAL LOW (ref >59)

## 2022-04-23 LAB — BRAIN NATRIURETIC PEPTIDE: BNP: 92.8 pg/mL (ref 0.0–100.0)

## 2022-04-23 LAB — TSH: TSH: 1.74 u[IU]/mL (ref 0.450–4.500)

## 2022-04-26 ENCOUNTER — Ambulatory Visit (INDEPENDENT_AMBULATORY_CARE_PROVIDER_SITE_OTHER): Payer: Medicare Other | Admitting: Family Medicine

## 2022-04-26 VITALS — BP 124/60 | HR 60 | Wt 141.4 lb

## 2022-04-26 DIAGNOSIS — R600 Localized edema: Secondary | ICD-10-CM

## 2022-04-26 DIAGNOSIS — I5030 Unspecified diastolic (congestive) heart failure: Secondary | ICD-10-CM | POA: Diagnosis not present

## 2022-04-26 NOTE — Progress Notes (Signed)
     SUBJECTIVE:   CHIEF COMPLAINT / HPI:   Cheryl Anderson is a 88 y.o. female presents for follow up  Daughter present at visit.  Leg edema  Seen on 04/22/2022 for worsening of chronic bilateral leg edema.  I recommended pt increases her Lasix to 40 mg twice daily.  Patient has noticed an improvement with her edema with Lasix 40 mg twice daily, adequate diuretic response.  Weight today 64.1 kg.  Weight on 7/14 64.4 kg.  Usually sees [facility] every year.  Has not had a follow-up this year.  Flowsheet Row Office Visit from 07/29/2021 in [facility]  PHQ-9 Total Score 0        PERTINENT  PMH / PSH: HFpEF, CAD, hypertension, PAD  OBJECTIVE:   BP 124/60   Pulse 60   Wt 141 lb 6.4 oz (64.1 kg)   SpO2 100%   BMI 22.82 kg/m    General: Alert, no acute distress Cardio: Well-perfused, no JVD Pulm: normal work of breathing Extremities: 1+ peripheral edema bilaterally Neuro: Cranial nerves grossly intact   ASSESSMENT/PLAN:   (HFpEF) heart failure with preserved ejection fraction (HCC) Peripheral edema improved with Lasix 40 mg twice daily.  Now just 1+ however per daughter they are still more edematous than her baseline..  Recommended patient continues Lasix 40 mg twice daily until the edema resolves/until she sees cardiology in the next 1 week.  If no improvement in symptoms she should come back to see [place].  She should also continue compression stockings.  Obtain BMP today.    Towanda Octave, MD PGY-3 [facility]

## 2022-04-26 NOTE — Patient Instructions (Signed)
Thank you for coming to see me today. It was a pleasure. Today we discussed your leg swelling I am glad it improved with the lasix. I recommend lasix 40mg  twice a day until the swelling  improves or until you see the cardiologist.  We will get some labs today.  If they are abnormal or we need to do something about them, I will call you.  If they are normal, I will send you a message on MyChart (if it is active) or a letter in the mail.  If you don't hear from in 2 weeks, please call the office at the number below.   Please follow-up with [place] in 1 week if no improvement in swelling  If you have any questions or concerns, please do not hesitate to call the office at [phone].  Best wishes,   Dr [phone]

## 2022-04-26 NOTE — Assessment & Plan Note (Signed)
Peripheral edema improved with Lasix 40 mg twice daily.  Now just 1+ however per daughter they are still more edematous than her baseline..  Recommended patient continues Lasix 40 mg twice daily until the edema resolves/until she sees cardiology in the next 1 week.  If no improvement in symptoms she should come back to see [place].  She should also continue compression stockings.  Obtain BMP today.

## 2022-04-27 LAB — BASIC METABOLIC PANEL
BUN/Creatinine Ratio: 22 (ref 12–28)
BUN: 26 mg/dL (ref 10–36)
CO2: 25 mmol/L (ref 20–29)
Calcium: 9.2 mg/dL (ref 8.7–10.3)
Chloride: 102 mmol/L (ref 96–106)
Creatinine, Ser: 1.19 mg/dL — ABNORMAL HIGH (ref 0.57–1.00)
Glucose: 95 mg/dL (ref 70–99)
Potassium: 4.2 mmol/L (ref 3.5–5.2)
Sodium: 139 mmol/L (ref 134–144)
eGFR: 43 mL/min/{1.73_m2} — ABNORMAL LOW (ref >59)

## 2022-04-27 NOTE — Assessment & Plan Note (Addendum)
Likely mild worsening of chronic CHF with chronic venous insufficiency. Last echo was 11/16/21 showing EF 60-65% and G1DD. Doubt other organic cause of her worsening edema. She usually takes lasix 40mg  once daily with a good diuretic response. Obtained BMP, BNP and TSH today. EKG obtained to check rhythm as I was initially concerned for arrthymia on examination, however pt is in sinus rhythm. Recommended pt increases her lasix to 40mg  BID and follows up with me in 4 days. Also recommend she continues wearing compression stockings. Return precautions provided to pt and her daughter who expressed understanding.

## 2022-05-05 ENCOUNTER — Other Ambulatory Visit: Payer: Self-pay | Admitting: Family Medicine

## 2022-05-05 DIAGNOSIS — I10 Essential (primary) hypertension: Secondary | ICD-10-CM

## 2022-05-16 ENCOUNTER — Telehealth: Payer: Self-pay

## 2022-05-16 DIAGNOSIS — I200 Unstable angina: Secondary | ICD-10-CM

## 2022-05-16 MED ORDER — NITROGLYCERIN 0.4 MG SL SUBL: SUBLINGUAL_TABLET | SUBLINGUAL | 2 refills | Status: AC

## 2022-05-16 NOTE — Telephone Encounter (Signed)
Patient's daughter calls nurse line requesting refill on nitroglycerin. Daughter reports new onset chest pain that started about one hour ago.   Patient has history of CAD and was previously being prescribed nitroglycerin, this is not on current medication list.   Denies radiating pain, nausea, vomiting, or SHOB. Reports that pain is on the left side of the chest.   Precepted with Dr. Leveda Anna. Due to patient having current chest pain and no nitroglycerin on hand, recommends ED eval. Patient refusing to go to the ED at this time and would like refill for nitroglycerin to be sent over as soon as possible.   Strongly encouraged ED eval and to also reach out to cardiology.   Daughter reports that if pain worsens she will take mother to the ED.   Spoke with Dr. Pollie Meyer (preceptor) regarding request for nitroglycerin. Received verbal order per Dr. Pollie Meyer to send nitroglycerin to pharmacy.   Called and updated daughter. Reiterated importance of evaluation for chest pain. Daughter verbalizes understanding and states she will monitor mother closely and will take to the ED with any worsening or new symptoms.   Veronda Prude, RN

## 2022-06-26 NOTE — Progress Notes (Unsigned)
Cardiology Office Note:    Date:  06/28/2022   ID:  Cheryl Anderson, DOB August 13, 1929, MRN [id number]  PCP:  Cheryl Chard, DO    HeartCare Providers Cardiologist:  Cheryl Millers, MD Cardiology APP:  Cheryl Duster, PA     Referring MD: Cheryl Chard, DO   Chief Complaint  Patient presents with   Follow-up    HFpEF    History of Present Illness:    Cheryl Anderson is a 88 y.o. female with a hx of HFpEF, HTN, and hyperlipidemia. Last echo 11/2021 with LVEF 60-65%, grade 1 DD, normal RV function, and no significant valvular disease. She was last seen by Dr. Jens Anderson 05/20/21 and was doing well at that time.   She presents for routine follow up. She is here with her daughter. She has no cardiac complaints, but does have mild LE edema. This is described as dependent edema. She uses compression stockings but does not elevate her legs. She otherwise appears euvolemic on exam. She takes 40 mg lasix at baseline. Daughter states she does not walk due to neuropathy in her right foot and she feels unstable. I discussed HH PT, but they do not want to start this at this time.   Past Medical History:  Diagnosis Date   BPPV (benign paroxysmal positional vertigo) 03/16/2016   Callus of foot 04/01/2014   CHF (congestive heart failure) (HCC)    Heart disorder    Hyperlipidemia    Hypertension    Leg cramps, sleep related 05/07/2012   Osteoporosis    Plantar fasciitis 04/01/2014   Subcutaneous nodule of chest wall 12/15/2010    Past Surgical History:  Procedure Laterality Date   No prior surgery      Current Medications: Current Meds  Medication Sig   aspirin EC 81 MG tablet Take 1 tablet by mouth daily.   atorvastatin (LIPITOR) 80 MG tablet Take 1 tablet (80 mg total) by mouth daily.   brimonidine-timolol (COMBIGAN) 0.2-0.5 % ophthalmic solution Place 1 drop into both eyes every 12 (twelve) hours.   fluticasone (FLONASE) 50 MCG/ACT nasal spray SPRAY 2 SPRAYS INTO EACH NOSTRIL  EVERY DAY (Patient taking differently: Place 1 spray into both nostrils daily as needed for allergies or rhinitis.)   furosemide (LASIX) 40 MG tablet TAKE 1 TABLET BY MOUTH EVERY DAY   isosorbide mononitrate (IMDUR) 30 MG 24 hr tablet TAKE 1 TABLET BY MOUTH EVERY DAY   nitroGLYCERIN (NITROSTAT) 0.4 MG SL tablet Take every 5 minutes for 3 doses. If pain not resolved, call 911 or go to ER.   ramipril (ALTACE) 5 MG capsule TAKE 1 CAPSULE BY MOUTH EVERY DAY   risedronate (ACTONEL) 35 MG tablet Take 1 tablet (35 mg total) by mouth every 7 (seven) days. with water on empty stomach, nothing by mouth or lie down for next 30 minutes.     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  Tobacco Use   Smoking status: Every Day    Packs/day: 1.00    Years: 69.00    Total pack years: 69.00    Types: Cigarettes   Smokeless tobacco: Never  Substance and Sexual Activity   Alcohol use: Yes  Alcohol/week: 0.0 standard drinks of alcohol    Comment: OCCASIONALLY   Drug use: No   Sexual activity: Never  Other Topics Concern   Not on file  Social History Narrative   Health Care POA:    Emergency Contact: daughter, Cheryl Anderson [phone]   End of Life Plan: gave pt AD pamphlet   Who lives with you: daughter, 4 grandchildren   Any pets: 4 dogs   Diet: Pt has a varied diet of protein, starch and vegetables.   Exercise: Pt does not have regular exercise routine but does work in garden daily.   Seatbelts: Pt reports wearing seatbelt when in vehicles.    Hobbies: gardening         Social Determinants of Health   Financial Resource Strain: Not on file  Food Insecurity: Not on file  Transportation Needs: Not on file  Physical Activity: Not on file  Stress: Not  on file  Social Connections: Not on file     Family History: The patient's family history includes Cancer in her brother and sister; Diabetes in her daughter.  ROS:   Please see the history of present illness.     All other systems reviewed and are negative.  EKGs/Labs/Other Studies Reviewed:    The following studies were reviewed today:  Echo 11/16/21:  1. Left ventricular ejection fraction, by estimation, is 60 to 65%. The  left ventricle has normal function. The left ventricle has no regional  wall motion abnormalities. Left ventricular diastolic parameters are  consistent with Grade I diastolic  dysfunction (impaired relaxation).   2. Right ventricular systolic function is normal. The right ventricular  size is normal. There is normal pulmonary artery systolic pressure.   3. The mitral valve is normal in structure. No evidence of mitral valve  regurgitation. No evidence of mitral stenosis.   4. The aortic valve is tricuspid. Aortic valve regurgitation is not  visualized. No aortic stenosis is present.   5. The inferior vena cava is normal in size with greater than 50%  respiratory variability, suggesting right atrial pressure of 3 mmHg.   EKG:  EKG is not ordered today.    Recent Labs: 08/22/2021: Hemoglobin 13.2; Platelets 264 04/22/2022: ALT 10; BNP 92.8; TSH 1.740 04/26/2022: BUN 26; Creatinine, Ser 1.19; Potassium 4.2; Sodium 139  Recent Lipid Panel    Component Value Date/Time   CHOL 234 (H) 07/29/2021 1152   TRIG 65 07/29/2021 1152   HDL 58 07/29/2021 1152   CHOLHDL 4.0 07/29/2021 1152   CHOLHDL 4.8 07/25/2013 1234   VLDL 19 07/25/2013 1234   LDLCALC 165 (H) 07/29/2021 1152     Risk Assessment/Calculations:      HYPERTENSION CONTROL Vitals:   06/28/22 1437 06/28/22 1608  BP: (!) 146/72 (!) 145/70    The patient's blood pressure is elevated above target today.  In order to address the patient's elevated BP: Blood pressure will be monitored at home to  determine if medication changes need to be made.      She did not take her BP medications this morning.      Physical Exam:    VS:  BP (!) 145/70   Pulse 64   Ht 5\' 6"  (1.676 m)   Wt 144 lb 9.6 oz (65.6 kg)   SpO2 95%   BMI 23.34 kg/m     Wt Readings from Last 3 Encounters:  06/28/22 144 lb 9.6 oz (65.6 kg)  04/26/22 141 lb 6.4 oz (64.1 kg)  04/22/22 142  lb (64.4 kg)     GEN:  Well nourished, well developed in no acute distress HEENT: Normal NECK: No JVD; No carotid bruits LYMPHATICS: No lymphadenopathy CARDIAC: RRR, no murmurs, rubs, gallops RESPIRATORY:  Clear to auscultation without rales, wheezing or rhonchi  ABDOMEN: Soft, non-tender, non-distended MUSCULOSKELETAL:  Mild LE edema; No deformity  SKIN: Warm and dry NEUROLOGIC:  Alert and oriented x 3 PSYCHIATRIC:  Normal affect   ASSESSMENT:    1. Chronic diastolic CHF (congestive heart failure) (HCC)   2. HYPERTENSION, BENIGN ESSENTIAL   3. Hyperlipidemia with target low density lipoprotein (LDL) cholesterol less than 160 mg/dL    PLAN:    In order of problems listed above:  Chronic diastolic dysfunction Echo 11/2021 with grade 1 DD Takes 40 mg lasix daily Dependent edema in lower extremities, otherwise euvolemic   Hypertension Ramipril, imdur BP 146/72, but she rushed out of the house and forgot morning medication today No medication changes   Hyperlipidemia Continue lipitor 80 mg and zetia I do not feel strongly about checking a lipid panel  Follow up in 1 year.     Medication Adjustments/Labs and Tests Ordered: Current medicines are reviewed at length with the patient today.  Concerns regarding medicines are outlined above.  No orders of the defined types were placed in this encounter.  No orders of the defined types were placed in this encounter.   Patient Instructions  Medication Instructions:  Your physician recommends that you continue on your current medications as directed. Please  refer to the Current Medication list given to you today.  *If you need a refill on your cardiac medications before your next appointment, please call your pharmacy*   Lab Work: NONE ordered at this time of appointment  If you have labs (blood work) drawn today and your tests are completely normal, you will receive your results only by: MyChart Message (if you have MyChart) OR A paper copy in the mail If you have any lab test that is abnormal or we need to change your treatment, we will call you to review the results.   Testing/Procedures: NONE ordered at this time of appointment    Follow-Up: At [facility], you and your health needs are our priority.  As part of our continuing mission to provide you with exceptional heart care, we have created designated Provider Care Teams.  These Care Teams include your primary Cardiologist (physician) and Advanced Practice Providers (APPs -  Physician Assistants and Nurse Practitioners) who all work together to provide you with the care you need, when you need it.  We recommend signing up for the patient portal called "MyChart".  Sign up information is provided on this After Visit Summary.  MyChart is used to connect with patients for Virtual Visits (Telemedicine).  Patients are able to view lab/test results, encounter notes, upcoming appointments, etc.  Non-urgent messages can be sent to your provider as well.   To learn more about what you can do with MyChart, go to [email or web address].    Your next appointment:   1 year(s)  The format for your next appointment:   In Person  Provider:   Olga Millers, MD     Signed, Roe Rutherford [place] Record, [place]  06/28/2022 4:09 PM    [facility]

## 2022-06-28 ENCOUNTER — Ambulatory Visit: Payer: Medicare Other | Attending: Physician Assistant | Admitting: Physician Assistant

## 2022-06-28 ENCOUNTER — Encounter: Payer: Self-pay | Admitting: Physician Assistant

## 2022-06-28 VITALS — BP 145/70 | HR 64 | Ht 66.0 in | Wt 144.6 lb

## 2022-06-28 DIAGNOSIS — E785 Hyperlipidemia, unspecified: Secondary | ICD-10-CM | POA: Diagnosis not present

## 2022-06-28 DIAGNOSIS — I5032 Chronic diastolic (congestive) heart failure: Secondary | ICD-10-CM

## 2022-06-28 DIAGNOSIS — I10 Essential (primary) hypertension: Secondary | ICD-10-CM | POA: Diagnosis not present

## 2022-06-28 NOTE — Patient Instructions (Signed)
Medication Instructions:  Your physician recommends that you continue on your current medications as directed. Please refer to the Current Medication list given to you today.  *If you need a refill on your cardiac medications before your next appointment, please call your pharmacy*   Lab Work: NONE ordered at this time of appointment  If you have labs (blood work) drawn today and your tests are completely normal, you will receive your results only by: [place] (if you have MyChart) OR A paper copy in the mail If you have any lab test that is abnormal or we need to change your treatment, we will call you to review the results.   Testing/Procedures: NONE ordered at this time of appointment    Follow-Up: At [facility], you and your health needs are our priority.  As part of our continuing mission to provide you with exceptional heart care, we have created designated Provider Care Teams.  These Care Teams include your primary Cardiologist (physician) and Advanced Practice Providers (APPs -  Physician Assistants and Nurse Practitioners) who all work together to provide you with the care you need, when you need it.  We recommend signing up for the patient portal called "MyChart".  Sign up information is provided on this After Visit Summary.  MyChart is used to connect with patients for Virtual Visits (Telemedicine).  Patients are able to view lab/test results, encounter notes, upcoming appointments, etc.  Non-urgent messages can be sent to your provider as well.   To learn more about what you can do with MyChart, go to [email or web address].    Your next appointment:   1 year(s)  The format for your next appointment:   In Person  Provider:   Kirk Ruths, MD

## 2022-07-15 ENCOUNTER — Ambulatory Visit (INDEPENDENT_AMBULATORY_CARE_PROVIDER_SITE_OTHER): Payer: Medicare Other | Admitting: Family Medicine

## 2022-07-15 VITALS — BP 179/67 | HR 66 | Ht 66.0 in | Wt 147.0 lb

## 2022-07-15 DIAGNOSIS — R600 Localized edema: Secondary | ICD-10-CM

## 2022-07-15 DIAGNOSIS — Z23 Encounter for immunization: Secondary | ICD-10-CM

## 2022-07-15 NOTE — Assessment & Plan Note (Signed)
Tolerated flu vaccine well without any adverse events.

## 2022-07-15 NOTE — Patient Instructions (Signed)
It was wonderful to meet you today. Thank you for allowing me to be a part of your care. Below is a short summary of what we discussed at your visit today:  Leg swelling I agree with the cardiologist that we do not need to add any medications as I do not believe your heart is the cause.  I do not believe you are generally fluid overloaded.  Additional medication will put you at risk for side effects.  Please take the printed compression stockings prescription to the medical supply store of your choice.  They should provide the proper size of stocking based on your foot and calf measurements.  You to compression level of 20 to 30 mmHg (which provides moderate support).  Please call us or send a MyChart message if you are not any problems at the medical supply store.  I have also referred you to the vascular specialists to discuss this problem.  They may have some additional treatments or insight to offer.  If you do not hear from someone in their office in 1 to 2 weeks, please give [place] a call as I may need to nudge along the referral.   Please bring all of your medications to every appointment!  If you have any questions or concerns, please do not hesitate to contact us via phone or MyChart message.   Ezequiel Essex, MD

## 2022-07-15 NOTE — Assessment & Plan Note (Signed)
Likely chronic venous insufficiency.  Previously increased Lasix over the summer without much effect on leg swelling.  Not currently on medications medical swelling.  She is currently at her dry weight without evidence of fluid overload, so cardiac etiology likely noncontributory.  Discussed prescription compression stockings fitted for her foot and calf measurements with moderate compression of 20 to 30 mmHg.  Referred to vascular specialist for further evaluation and treatment.

## 2022-07-15 NOTE — Progress Notes (Signed)
    SUBJECTIVE:   CHIEF COMPLAINT / HPI:   Cheryl Anderson is a pleasant 88 year old female who presents today with her daughter for the complaint of bilateral leg and ankle swelling.  She reports this is been going on for about a year and she has been seen multiple times for it.  She has not gained much improvement with her past interventions.  She was seen here at [facility] 7/18, was told to increase Lasix to 40 mg twice daily.  This only gave a little bit of benefit, not much.  Was also recommended to wear compression stockings, which again she notes are without benefit.  She placed them in the morning first thing and wears them all throughout the day, only take them off at bedtime.  She reports she still has swelling during the day despite wearing the compression stockings.  She last saw cardiology 9/19 had reported this problem with the bilateral leg swelling to them.  No medication changes were made, they continued her on the current lasix.   She believes she has not gained any noticeable weight.  Denies shortness of breath, wet cough, and orthopnea.  Sleeps with 1 pillow at night.  PERTINENT  PMH / PSH: HFpEF 60-55%, CAD, HTN, PAD, COPD, GERD, osteoarthritis, tobacco use  OBJECTIVE:   BP (!) 179/67   Pulse 66   Ht 5\' 6"  (1.676 m)   Wt 147 lb (66.7 kg)   SpO2 99%   BMI 23.73 kg/m    PHQ-9:     07/29/2021    9:57 AM 09/01/2020    8:58 AM 05/29/2020    2:13 PM  Depression screen PHQ 2/9  Decreased Interest 0 0 0  Down, Depressed, Hopeless 0 0 0  PHQ - 2 Score 0 0 0  Altered sleeping 0 0 0  Tired, decreased energy 0 0 0  Change in appetite 0 0 0  Feeling bad or failure about yourself  0 0 0  Trouble concentrating 0 0 0  Moving slowly or fidgety/restless 0 0 0  Suicidal thoughts 0 0 0  PHQ-9 Score 0 0 0   Physical Exam General: Awake, alert, oriented Cardiovascular: Regular rate and rhythm, S1 and S2 present, no murmurs auscultated, no JVD appreciated, brisk cap refill, normal  skin turgor Respiratory: Lung fields clear to auscultation bilaterally, no crackles evident Extremities: Dependent 3+ pitting edema of ankles, notably feet where shoes were squeezing have no edema, 1+ pitting edema to level of mid shin, bilateral feet cooler than cavus but with palpable pretibial and dorsalis pedis pulses  ASSESSMENT/PLAN:   Need for immunization against influenza Tolerated flu vaccine well without any adverse events.  Bilateral lower extremity edema Likely chronic venous insufficiency.  Previously increased Lasix over the summer without much effect on leg swelling.  Not currently on medications medical swelling.  She is currently at her dry weight without evidence of fluid overload, so cardiac etiology likely noncontributory.  Discussed prescription compression stockings fitted for her foot and calf measurements with moderate compression of 20 to 30 mmHg.  Referred to vascular specialist for further evaluation and treatment.     Ezequiel Essex, MD [place]

## 2022-07-23 ENCOUNTER — Other Ambulatory Visit: Payer: Self-pay | Admitting: Family Medicine

## 2022-07-23 DIAGNOSIS — E785 Hyperlipidemia, unspecified: Secondary | ICD-10-CM

## 2022-08-17 ENCOUNTER — Other Ambulatory Visit: Payer: Self-pay | Admitting: *Deleted

## 2022-08-17 DIAGNOSIS — R609 Edema, unspecified: Secondary | ICD-10-CM

## 2022-08-20 ENCOUNTER — Other Ambulatory Visit: Payer: Self-pay | Admitting: Family Medicine

## 2022-08-20 DIAGNOSIS — I5032 Chronic diastolic (congestive) heart failure: Secondary | ICD-10-CM

## 2022-08-25 ENCOUNTER — Ambulatory Visit (HOSPITAL_COMMUNITY)
Admission: RE | Admit: 2022-08-25 | Discharge: 2022-08-25 | Disposition: A | Discharge status: Home / Self Care | Payer: Medicare Other | Source: Ambulatory Visit | Attending: Vascular Surgery | Admitting: Vascular Surgery

## 2022-08-25 ENCOUNTER — Ambulatory Visit: Payer: Medicare Other | Admitting: Physician Assistant

## 2022-08-25 VITALS — BP 193/77 | HR 69 | Temp 97.2°F | Resp 14 | Ht 65.0 in | Wt 140.0 lb

## 2022-08-25 DIAGNOSIS — R609 Edema, unspecified: Secondary | ICD-10-CM | POA: Diagnosis present

## 2022-08-25 DIAGNOSIS — I872 Venous insufficiency (chronic) (peripheral): Secondary | ICD-10-CM

## 2022-08-25 DIAGNOSIS — M7989 Other specified soft tissue disorders: Secondary | ICD-10-CM

## 2022-08-25 NOTE — Progress Notes (Signed)
[facility] [place]   Reason for referral: Swollen B leg  History of Present Illness  Cheryl Anderson is a 88 y.o. female who presents with chief complaint: swollen leg.  Patient notes, onset of swelling  months , associated with prolonged sitting and standing.  The patient has had no history of DVT, no history of varicose vein, no history of venous stasis ulcers, no history of  Lymphedema and no history of skin changes in lower legs.  There is unknown family history of venous disorders.  The patient has  used compression stockings knee highs in the past.  Past Medical History:  Diagnosis Date   BPPV (benign paroxysmal positional vertigo) 03/16/2016   Callus of foot 04/01/2014   CHF (congestive heart failure) (HCC)    Heart disorder    Hyperlipidemia    Hypertension    Leg cramps, sleep related 05/07/2012   Osteoporosis    Plantar fasciitis 04/01/2014   Subcutaneous nodule of chest wall 12/15/2010    Past Surgical History:  Procedure Laterality Date   No prior surgery      Social History   Socioeconomic History   Marital status: Widowed    Spouse name: Not on file   Number of children: 6   Years of education: 79   Highest education level: Not on file  Occupational History   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  Tobacco Use   Smoking status: Every Day    Packs/day: 1.00    Years: 69.00    Total pack years: 69.00    Types: Cigarettes   Smokeless tobacco: Never  Substance and Sexual Activity   Alcohol use: Yes    Alcohol/week: 0.0 standard drinks of alcohol    Comment: OCCASIONALLY   Drug use: No   Sexual activity: Never  Other Topics Concern   Not on file  Social History Narrative   Health Care POA:    Emergency Contact: daughter, Rinaldo Cloud [phone]   End of Life Plan: gave pt AD pamphlet   Who lives with you: daughter, 4 grandchildren   Any pets: 4 dogs   Diet: Pt has a varied diet of protein, starch and vegetables.    Exercise: Pt does not have regular exercise routine but does work in garden daily.   Seatbelts: Pt reports wearing seatbelt when in vehicles.    Hobbies: gardening         Social Determinants of Health   Financial Resource Strain: Not on file  Food Insecurity: Not on file  Transportation Needs: Not on file  Physical Activity: Not on file  Stress: Not on file  Social Connections: Not on file  Intimate Partner Violence: Not on file    Family History  Problem Relation Age of Onset   Cancer Sister    Cancer Brother        Throat CA   Diabetes Daughter     Current Outpatient Medications on File Prior to Visit  Medication Sig Dispense Refill   aspirin EC 81 MG tablet Take 1 tablet by mouth daily.     atorvastatin (LIPITOR) 80 MG tablet TAKE 1 TABLET BY MOUTH EVERY DAY 90 tablet 3   brimonidine-timolol (COMBIGAN) 0.2-0.5 % ophthalmic solution Place 1 drop into both eyes every 12 (twelve) hours.     fluticasone (FLONASE) 50 MCG/ACT nasal spray SPRAY 2 SPRAYS INTO EACH NOSTRIL EVERY DAY (Patient taking differently: Place 1 spray into both nostrils daily as needed for allergies or  rhinitis.) 48 mL 3   furosemide (LASIX) 40 MG tablet TAKE 1 TABLET BY MOUTH EVERY DAY 90 tablet 1   isosorbide mononitrate (IMDUR) 30 MG 24 hr tablet TAKE 1 TABLET BY MOUTH EVERY DAY 90 tablet 1   nitroGLYCERIN (NITROSTAT) 0.4 MG SL tablet Take every 5 minutes for 3 doses. If pain not resolved, call 911 or go to ER. 30 tablet 2   ramipril (ALTACE) 5 MG capsule TAKE 1 CAPSULE BY MOUTH EVERY DAY 90 capsule 1   risedronate (ACTONEL) 35 MG tablet Take 1 tablet (35 mg total) by mouth every 7 (seven) days. with water on empty stomach, nothing by mouth or lie down for next 30 minutes. 4 tablet 3   No current facility-administered medications on file prior to visit.    Allergies as of 08/25/2022 - Review Complete 08/25/2022  Allergen Reaction Noted   Codeine Nausea Only      ROS:   General:  No weight loss,  Fever, chills  HEENT: No recent headaches, no nasal bleeding, no visual changes, no sore throat  Neurologic: No dizziness, blackouts, seizures. No recent symptoms of stroke or mini- stroke. No recent episodes of slurred speech, or temporary blindness.  Cardiac: No recent episodes of chest pain/pressure, no shortness of breath at rest.  No shortness of breath with exertion.  Denies history of atrial fibrillation or irregular heartbeat  Vascular: No history of rest pain in feet.  No history of claudication.  No history of non-healing ulcer, No history of DVT   Pulmonary: No home oxygen, no productive cough, no hemoptysis,  No asthma or wheezing  Musculoskeletal:  [ x] Arthritis, [ ]  Low back pain,  [ ]  Joint pain  Hematologic:No history of hypercoagulable state.  No history of easy bleeding.  No history of anemia  Gastrointestinal: No hematochezia or melena,  No gastroesophageal reflux, no trouble swallowing  Urinary: [ ]  chronic Kidney disease, [ ]  on HD - [ ]  MWF or [ ]  TTHS, [ ]  Burning with urination, [ ]  Frequent urination, [ ]  Difficulty urinating;   Skin: No rashes  Psychological: No history of anxiety,  No history of depression  Physical Examination  Vitals:   08/25/22 1344 08/25/22 1348  BP: (!) 201/76 (!) 193/77  Pulse: 69   Resp: 14   Temp: (!) 97.2 F (36.2 C)   TempSrc: Temporal   SpO2: 100%   Weight: 140 lb (63.5 kg)   Height: 5\' 5"  (1.651 m)     Body mass index is 23.3 kg/m.  General:  Alert and oriented, no acute distress HEENT: Normal Neck: No bruit or JVD Pulmonary: Clear to auscultation bilaterally Cardiac: Regular Rate and Rhythm without murmur Abdomen: Soft, non-tender, non-distended, no mass, no scars Skin: No rash Extremity Pulses:  2+ radial,  femoral, dorsalis pedis,  pulses bilaterally Musculoskeletal: No deformity or edema  Neurologic: Upper and lower extremity motor intact and symmetric  DATA: Venous Reflux Times   +--------------+---------+------+-----------+------------+--------+  RIGHT        Reflux NoRefluxReflux TimeDiameter cmsComments                          Yes                                   +--------------+---------+------+-----------+------------+--------+  CFV  yes   >1 second                       +--------------+---------+------+-----------+------------+--------+  FV mid                  yes   >1 second                       +--------------+---------+------+-----------+------------+--------+  Popliteal    no                                              +--------------+---------+------+-----------+------------+--------+  [facility]              yes    >500 ms      0.75              +--------------+---------+------+-----------+------------+--------+  GSV prox thigh          yes    >500 ms      0.41              +--------------+---------+------+-----------+------------+--------+  GSV mid thigh no                            0.28              +--------------+---------+------+-----------+------------+--------+  GSV dist thighno                            0.27              +--------------+---------+------+-----------+------------+--------+  GSV at knee   no                            0.31              +--------------+---------+------+-----------+------------+--------+  GSV prox calf no                            0.16              +--------------+---------+------+-----------+------------+--------+  GSV mid calf  no                            0.13              +--------------+---------+------+-----------+------------+--------+  SSV Pop Fossa no                            0.29              +--------------+---------+------+-----------+------------+--------+  SSV prox calf no                            0.16               +--------------+---------+------+-----------+------------+--------+  SSV mid calf  no                            0.21              +--------------+---------+------+-----------+------------+--------+  AASV o  no                            0.30              +--------------+---------+------+-----------+------------+--------+               no                            0.21              +--------------+---------+------+-----------+------------+--------+      Summary:  Right:  - No evidence of deep vein thrombosis seen in the right lower extremity,  from the common femoral through the popliteal veins.  - No evidence of superficial venous reflux seen in the right short  saphenous vein.  - Venous reflux is noted in the right common femoral vein.  - Venous reflux is noted in the right greater saphenous vein in the thigh.  - Venous reflux is noted in the right femoral vein.   - No evidence of reflux in the AASV.       Assessment: Venous reflux mixed Deep and GSV with reflux at the [facility] No evidence of DVT She has palpable pedal pulses B LE and is not at risk of limb loss.  No skin changes or history of venous wounds.   Plan:She was measured for thigh high compression 20-30 mm hg.  She will continue to be active daily and elevate her legs multiple time a day as needed.  She is not interested in laser ablation at this time and wishes to manage this conservatively.  If she has problems or concerns she will call our office.    Mosetta Pigeon PA-C [facility] [place] Office: [phone]  MD in clinic [place]

## 2022-09-25 ENCOUNTER — Other Ambulatory Visit: Payer: Self-pay | Admitting: Family Medicine

## 2022-09-25 DIAGNOSIS — I10 Essential (primary) hypertension: Secondary | ICD-10-CM

## 2022-11-18 ENCOUNTER — Other Ambulatory Visit: Payer: Self-pay | Admitting: Student

## 2022-11-18 DIAGNOSIS — I10 Essential (primary) hypertension: Secondary | ICD-10-CM

## 2022-12-07 ENCOUNTER — Ambulatory Visit (INDEPENDENT_AMBULATORY_CARE_PROVIDER_SITE_OTHER): Payer: Medicare Other | Admitting: Family Medicine

## 2022-12-07 ENCOUNTER — Encounter: Payer: Self-pay | Admitting: Family Medicine

## 2022-12-07 VITALS — BP 176/69 | HR 80 | Ht 65.0 in | Wt 134.0 lb

## 2022-12-07 DIAGNOSIS — J329 Chronic sinusitis, unspecified: Secondary | ICD-10-CM

## 2022-12-07 DIAGNOSIS — B9689 Other specified bacterial agents as the cause of diseases classified elsewhere: Secondary | ICD-10-CM | POA: Diagnosis not present

## 2022-12-07 DIAGNOSIS — I10 Essential (primary) hypertension: Secondary | ICD-10-CM

## 2022-12-07 MED ORDER — AMOXICILLIN-POT CLAVULANATE 875-125 MG PO TABS: 1.0000 | ORAL_TABLET | Freq: Two times a day (BID) | ORAL | 0 refills | Status: AC

## 2022-12-07 NOTE — Progress Notes (Signed)
    SUBJECTIVE:   CHIEF COMPLAINT / HPI:  Chief Complaint  Patient presents with   Cough    Started feeling ill 2 weeks ago with cough, rhinorrhea, headache Still having some cough and rhinorrhea No congestion Denies chest pain, SOB, fever  Did not take her BP medications this AM Home BP readings unsure  PERTINENT  PMH / PSH: HFpEF, CAD, COPD, GERD, HTN, tobacco use  Patient Care Team: Darci Current, DO as PCP - General (Family Medicine) Stanford Breed Denice Bors, MD as PCP - Cardiology (Cardiology) [facility], Tami Lin, PA as Physician Assistant (Cardiology)   OBJECTIVE:   BP (!) 176/69   Pulse 80   Ht 5' 5"$  (1.651 m)   Wt 134 lb (60.8 kg)   SpO2 100%   BMI 22.30 kg/m   Physical Exam Constitutional:      General: She is not in acute distress. HENT:     Head:     Comments: No frontal or maxillary sinus tenderness    Nose: Congestion present.     Mouth/Throat:     Mouth: Mucous membranes are moist.     Pharynx: Oropharynx is clear. No oropharyngeal exudate or posterior oropharyngeal erythema.  Cardiovascular:     Rate and Rhythm: Normal rate and regular rhythm.  Pulmonary:     Effort: Pulmonary effort is normal. No respiratory distress.     Breath sounds: Normal breath sounds.  Musculoskeletal:     Cervical back: Neck supple.     Comments: Trace edema bilateral  Neurological:     Mental Status: She is alert.         12/07/2022   11:21 AM  Depression screen PHQ 2/9  Decreased Interest 0  Down, Depressed, Hopeless 0  PHQ - 2 Score 0  Altered sleeping 0  Tired, decreased energy 0  Change in appetite 0  Feeling bad or failure about yourself  0  Trouble concentrating 0  Moving slowly or fidgety/restless 0  Suicidal thoughts 0  PHQ-9 Score 0     {Show previous vital signs (optional):23777}    ASSESSMENT/PLAN:   HYPERTENSION, BENIGN ESSENTIAL Uncontrolled, did not take medications today.  Advised to follow-up in 1 month.   1. Bacterial  sinusitis Patient with 2 weeks of cough and congestion, possibly viral but given prolonged symptoms we will treat for bacterial sinusitis.  Euvolemic on exam. - amoxicillin-clavulanate (AUGMENTIN) 875-125 MG tablet; Take 1 tablet by mouth 2 (two) times daily for 5 days.  Dispense: 10 tablet; Refill: 0   Return in about 4 weeks (around 01/04/2023), or if symptoms worsen or fail to improve, for f/u HTN.   Zola Button, MD [place]

## 2022-12-07 NOTE — Assessment & Plan Note (Signed)
Uncontrolled, did not take medications today.  Advised to follow-up in 1 month.

## 2022-12-07 NOTE — Patient Instructions (Addendum)
It was nice seeing you today!  Take antibiotics as prescribed.  You can try honey for cough.  Try to check your blood pressure at least a few times a week and write them down.  Stay well, Cheryl Button, MD [place] [phone]  --  Make sure to check out at the front desk before you leave today.  Please arrive at least 15 minutes prior to your scheduled appointments.  If you had blood work today, I will send you a MyChart message or a letter if results are normal. Otherwise, I will give you a call.  If you had a referral placed, they will call you to set up an appointment. Please give [place] a call if you don't hear back in the next 2 weeks.  If you need additional refills before your next appointment, please call your pharmacy first.

## 2023-01-02 ENCOUNTER — Telehealth: Payer: Self-pay | Admitting: Student

## 2023-01-02 NOTE — Telephone Encounter (Signed)
Called patient to schedule Medicare Annual Wellness Visit (AWV). No voicemail available to leave a message.  Last date of AWV: 02/01/2017   Please schedule an AWVS appointment at any time with [facility].  If any questions, please contact me at [phone].    Thank you,  [place] Direct dial  [phone]

## 2023-01-04 ENCOUNTER — Other Ambulatory Visit: Payer: Self-pay | Admitting: Student

## 2023-01-04 DIAGNOSIS — I5032 Chronic diastolic (congestive) heart failure: Secondary | ICD-10-CM

## 2023-01-23 ENCOUNTER — Encounter: Payer: Self-pay | Admitting: Student

## 2023-01-23 ENCOUNTER — Ambulatory Visit (INDEPENDENT_AMBULATORY_CARE_PROVIDER_SITE_OTHER): Payer: Medicare Other | Admitting: Student

## 2023-01-23 VITALS — BP 132/60 | HR 58 | Wt 138.8 lb

## 2023-01-23 DIAGNOSIS — I739 Peripheral vascular disease, unspecified: Secondary | ICD-10-CM

## 2023-01-23 DIAGNOSIS — R600 Localized edema: Secondary | ICD-10-CM | POA: Diagnosis not present

## 2023-01-23 DIAGNOSIS — I10 Essential (primary) hypertension: Secondary | ICD-10-CM | POA: Diagnosis not present

## 2023-01-23 NOTE — Assessment & Plan Note (Addendum)
Blood pressure today is within goal at 132/60.  Patient's blood pressure seems to be well-controlled by reported home BP readings.  Endorses good compliance and tolerance to ramipril and imdur. -Encourage patient to continue her blood pressure medications as prescribed -Reviewed hypotensive risk with the patient. -Courage continued blood pressure monitoring.

## 2023-01-23 NOTE — Patient Instructions (Signed)
It was wonderful to meet you today. Thank you for allowing me to be a part of your care. Below is a short summary of what we discussed at your visit today:  Your blood pressure today is stable at 132/60  Due to concerns for your leg pain I will order ferritin level and labs to check your blood count and electrolytes.  Follow-up in 2 weeks with your PCP to reassess restarting gabapentin depending on the lab findings.  Please bring all of your medications to every appointment!  If you have any questions or concerns, please do not hesitate to contact us via phone or MyChart message.   Jerre Simon, MD [facility]

## 2023-01-23 NOTE — Progress Notes (Signed)
    SUBJECTIVE:   CHIEF COMPLAINT / HPI:   Patient is a 89 year old female with past medical history of HTN, BPV, CAD, CHF, and PAD presenting for blood pressure follow-up.  She states blood pressure was elevated and according to daughter she will have intermittent dizziness when she does not take blood pressure medication.  Last episode of dizziness was 3 days ago which has since resolved after taking her home medication.  Denies any headache, chest pain, vision changes, or shortness of breath. No history of falls. Home BPs meds include ramipril and Imdur.  Bilateral leg pain Patient reports she is hide chronic bilateral leg pain that was previously treated with gabapentin on 300 mg daily.  Zocor about 2 times weekly and 3 days ago was unable to sleep due to the pain on the right leg. Her Gabapentin was discontinued couple of years ago and she endorsed good tolerance.  PERTINENT  PMH / PSH: Reviewed  OBJECTIVE:   BP 132/60   Pulse (!) 58   Wt 138 lb 12.8 oz (63 kg)   SpO2 97%   BMI 23.10 kg/m    Physical Exam General: Alert, well appearing, NAD Cardiovascular: RRR, No Murmurs, Normal S2/S2 Respiratory: CTAB, No wheezing or Rales Abdomen: No distension or tenderness Extremities: +1 bilateral LE edema to the shin.  ASSESSMENT/PLAN:   HYPERTENSION, BENIGN ESSENTIAL Blood pressure today is within goal at 132/60.  Patient's blood pressure seems to be well-controlled by reported home BP readings.  Endorses good compliance and tolerance to ramipril and imdur. -Encourage patient to continue her blood pressure medications as prescribed -Reviewed hypotensive risk with the patient. -Courage continued blood pressure monitoring.   Bilateral lower extremity pain Patient with prior history of restless leg syndrome per chart review is complaining of leg pain occurring about twice a week.  Reluctance to restart her on gabapentin given her age and medical time from when it was discontinued.    -Will obtain labs to check CBC, mag, ferritin, and BMP. -Follow-up with PCP to reassess need for medical management.   Jerre Simon, MD [facility]

## 2023-01-24 LAB — BASIC METABOLIC PANEL
BUN/Creatinine Ratio: 14 (ref 12–28)
BUN: 17 mg/dL (ref 10–36)
CO2: 25 mmol/L (ref 20–29)
Calcium: 9.1 mg/dL (ref 8.7–10.3)
Chloride: 104 mmol/L (ref 96–106)
Creatinine, Ser: 1.22 mg/dL — ABNORMAL HIGH (ref 0.57–1.00)
Glucose: 97 mg/dL (ref 70–99)
Potassium: 4.1 mmol/L (ref 3.5–5.2)
Sodium: 142 mmol/L (ref 134–144)
eGFR: 42 mL/min/{1.73_m2} — ABNORMAL LOW (ref >59)

## 2023-01-24 LAB — CBC
Hematocrit: 37.4 % (ref 34.0–46.6)
Hemoglobin: 12.1 g/dL (ref 11.1–15.9)
MCH: 29.8 pg (ref 26.6–33.0)
MCHC: 32.4 g/dL (ref 31.5–35.7)
MCV: 92 fL (ref 79–97)
Platelets: 260 10*3/uL (ref 150–450)
RBC: 4.06 x10E6/uL (ref 3.77–5.28)
RDW: 13 % (ref 11.7–15.4)
WBC: 6.1 10*3/uL (ref 3.4–10.8)

## 2023-01-24 LAB — FERRITIN: Ferritin: 239 ng/mL — ABNORMAL HIGH (ref 15–150)

## 2023-01-24 LAB — MAGNESIUM: Magnesium: 2.2 mg/dL (ref 1.6–2.3)

## 2023-04-06 ENCOUNTER — Other Ambulatory Visit: Payer: Self-pay | Admitting: Student

## 2023-04-06 DIAGNOSIS — I10 Essential (primary) hypertension: Secondary | ICD-10-CM

## 2023-06-20 ENCOUNTER — Ambulatory Visit (INDEPENDENT_AMBULATORY_CARE_PROVIDER_SITE_OTHER): Payer: Medicare Other | Admitting: *Deleted

## 2023-06-20 DIAGNOSIS — Z0000 Encounter for general adult medical examination without abnormal findings: Secondary | ICD-10-CM

## 2023-06-20 NOTE — Progress Notes (Signed)
Subjective:   Cheryl Anderson is a 89 y.o. female who presents for Medicare Annual (Subsequent) preventive examination.  Visit Complete: Virtual  I connected with  Cheryl Anderson on 06/20/23 by a audio enabled telemedicine application and verified that I am speaking with the correct person using two identifiers.  Patient Location: Home  Provider Location: Home Office  I discussed the limitations of evaluation and management by telemedicine. The patient expressed understanding and agreed to proceed.  Vital Signs: Unable to obtain new vitals due to this being a telehealth visit.   Review of Systems     Cardiac Risk Factors include: advanced age (>19men, >81 women);hypertension     Objective:    Today's Vitals   06/20/23 1113  PainSc: 2    There is no height or weight on file to calculate BMI.     06/20/2023   11:17 AM 12/07/2022   11:20 AM 04/26/2022    3:14 PM 04/22/2022   10:49 AM 08/30/2021    9:16 AM 08/22/2021   12:44 AM 03/15/2021    2:01 PM  Advanced Directives  Does Patient Have a Medical Advance Directive? Yes No No No No No No  Type of Media planner of Healthcare Power of Attorney in Chart? No - copy requested        Would patient like information on creating a medical advance directive?  No - Patient declined  No - Patient declined   No - Patient declined    Current Medications (verified) Outpatient Encounter Medications as of 06/20/2023  Medication Sig   aspirin EC 81 MG tablet Take 1 tablet by mouth daily.   atorvastatin (LIPITOR) 80 MG tablet TAKE 1 TABLET BY MOUTH EVERY DAY   brimonidine-timolol (COMBIGAN) 0.2-0.5 % ophthalmic solution Place 1 drop into both eyes every 12 (twelve) hours.   fluticasone (FLONASE) 50 MCG/ACT nasal spray SPRAY 2 SPRAYS INTO EACH NOSTRIL EVERY DAY (Patient taking differently: Place 1 spray into both nostrils daily as needed for allergies or rhinitis.)   furosemide (LASIX) 40 MG  tablet TAKE 1 TABLET BY MOUTH EVERY DAY   isosorbide mononitrate (IMDUR) 30 MG 24 hr tablet TAKE 1 TABLET BY MOUTH EVERY DAY   nitroGLYCERIN (NITROSTAT) 0.4 MG SL tablet Take every 5 minutes for 3 doses. If pain not resolved, call 911 or go to ER.   ramipril (ALTACE) 5 MG capsule TAKE 1 CAPSULE BY MOUTH EVERY DAY   risedronate (ACTONEL) 35 MG tablet Take 1 tablet (35 mg total) by mouth every 7 (seven) days. with water on empty stomach, nothing by mouth or lie down for next 30 minutes.   No facility-administered encounter medications on file as of 06/20/2023.    Allergies (verified) Codeine   History: Past Medical History:  Diagnosis Date   BPPV (benign paroxysmal positional vertigo) 03/16/2016   Callus of foot 04/01/2014   CHF (congestive heart failure) (HCC)    Heart disorder    Hyperlipidemia    Hypertension    Leg cramps, sleep related 05/07/2012   Osteoporosis    Plantar fasciitis 04/01/2014   Subcutaneous nodule of chest wall 12/15/2010   Past Surgical History:  Procedure Laterality Date   No prior surgery     Family History  Problem Relation Age of Onset   Cancer Sister    Cancer Brother        Throat CA   Diabetes Daughter    Social  History   Socioeconomic History   Marital status: Widowed    Spouse name: Not on file   Number of children: 6   Years of education: 38   Highest education level: Not on file  Occupational History   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  Tobacco Use   Smoking status: Every Day    Current packs/day: 1.00    Average packs/day: 1 pack/day for 69.0 years (69.0 ttl pk-yrs)    Types: Cigarettes   Smokeless tobacco: Never  Substance and Sexual Activity   Alcohol use: Yes    Alcohol/week: 0.0 standard drinks of alcohol    Comment: OCCASIONALLY   Drug use: No   Sexual activity: Never  Other Topics Concern   Not on file  Social History Narrative   Health Care POA:    Emergency Contact: daughter, Rinaldo Cloud [phone]    End of Life Plan: gave pt AD pamphlet   Who lives with you: daughter, 4 grandchildren   Any pets: 4 dogs   Diet: Pt has a varied diet of protein, starch and vegetables.   Exercise: Pt does not have regular exercise routine but does work in garden daily.   Seatbelts: Pt reports wearing seatbelt when in vehicles.    Hobbies: gardening         Social Determinants of Health   Financial Resource Strain: Low Risk  (06/20/2023)   Overall Financial Resource Strain (CARDIA)    Difficulty of Paying Living Expenses: Not hard at all  Food Insecurity: No Food Insecurity (06/20/2023)   Hunger Vital Sign    Worried About Running Out of Food in the Last Year: Never true    Ran Out of Food in the Last Year: Never true  Transportation Needs: No Transportation Needs (06/20/2023)   PRAPARE - Administrator, Civil Service (Medical): No    Lack of Transportation (Non-Medical): No  Physical Activity: Inactive (06/20/2023)   Exercise Vital Sign    Days of Exercise per Week: 0 days    Minutes of Exercise per Session: 0 min  Stress: No Stress Concern Present (06/20/2023)   [employer] of Occupational Health - Occupational Stress Questionnaire    Feeling of Stress : Not at all  Social Connections: Socially Isolated (06/20/2023)   Social Connection and Isolation Panel [NHANES]    Frequency of Communication with Friends and Family: Twice a week    Frequency of Social Gatherings with Friends and Family: Twice a week    Attends Religious Services: Never    Database administrator or Organizations: No    Attends Banker Meetings: Never    Marital Status: Widowed    Tobacco Counseling Ready to quit: Not Answered Counseling given: Not Answered   Clinical Intake:  Pre-visit preparation completed: Yes  Pain : 0-10 Pain Score: 2  Pain Location: Other (Comment) Pain Descriptors / Indicators: Aching, Dull Pain Onset: 1 to 4 weeks ago Pain Frequency: Intermittent      Diabetes: No CBG done?: No Did pt. bring in CBG monitor from home?: No  How often do you need to have someone help you when you read instructions, pamphlets, or other written materials from your doctor or pharmacy?: 1 - Never  Interpreter Needed?: No  Information entered by :: Remi Haggard LPN   Activities of Daily Living    06/20/2023   11:17 AM  In your present state of health, do you have any difficulty performing the following activities:  Vision? 1  Difficulty concentrating or making decisions? 0  Walking or climbing stairs? 1  Dressing or bathing? 0  Doing errands, shopping? 1  Preparing Food and eating ? N  Using the Toilet? N  In the past six months, have you accidently leaked urine? N  Do you have problems with loss of bowel control? N  Managing your Medications? N  Managing your Finances? Y  Housekeeping or managing your Housekeeping? N    Patient Care Team: Glendale Chard, DO as PCP - General (Family Medicine) Jens Som Madolyn Frieze, MD as PCP - Cardiology (Cardiology) Duke, Roe Rutherford, PA as Physician Assistant (Cardiology)  Indicate any recent Medical Services you may have received from other than [facility] providers in the past year (date may be approximate).     Assessment:   This is a routine wellness examination for [place].  Hearing/Vision screen Hearing Screening - Comments:: Some trouble  hearing No hearing aids Vision Screening - Comments:: Up to date Unsure of name   Goals Addressed             This Visit's Progress    Patient Stated       Continue current lifestyle       Depression Screen    06/20/2023   11:23 AM 01/23/2023    2:30 PM 12/07/2022   11:21 AM 04/22/2022   10:51 AM 08/30/2021    9:17 AM 07/29/2021    9:57 AM 09/01/2020    8:58 AM  PHQ 2/9 Scores  PHQ - 2 Score 0  0   0 0  PHQ- 9 Score 0  0   0 0  Exception Documentation  Patient refusal  Patient refusal Patient refusal      Fall Risk    06/20/2023   11:15 AM  01/23/2023    2:25 PM 12/07/2022   11:20 AM 04/22/2022   10:49 AM 08/30/2021    9:17 AM  Fall Risk   Falls in the past year? 0 0 0 0 1  Number falls in past yr: 0 0 0  0  Injury with Fall? 0 0 0  1  Follow up Falls evaluation completed;Education provided;Falls prevention discussed        MEDICARE RISK AT HOME: Medicare Risk at Home Any stairs in or around the home?: No If so, are there any without handrails?: No Home free of loose throw rugs in walkways, pet beds, electrical cords, etc?: Yes Adequate lighting in your home to reduce risk of falls?: Yes Life alert?: No Use of a cane, walker or w/c?: No Grab bars in the bathroom?: Yes Shower chair or bench in shower?: No Elevated toilet seat or a handicapped toilet?: No  TIMED UP AND GO:  Was the test performed?  No    Cognitive Function:    03/13/2014    4:00 PM  MMSE - Mini Mental State Exam  Orientation to time 5  Orientation to Place 5  Registration 3  Attention/ Calculation 0  Recall 2  Language- name 2 objects 2  Language- repeat 1  Language- follow 3 step command 3  Language- read & follow direction 0  Write a sentence 0  Copy design 0  Total score 21        06/20/2023   11:19 AM  6CIT Screen  What Year? 0 points  What month? 0 points  What time? 0 points  Count back from 20 4 points  Months in reverse 4 points  Repeat phrase 0 points  Total Score 8 points    Immunizations Immunization History  Administered Date(s) Administered   Fluad Quad(high Dose 65+) 07/16/2021, 07/15/2022   Influenza Split 08/17/2012   Influenza Whole 11/02/2007, 07/16/2008, 08/28/2009, 08/23/2010   Influenza,inj,Quad PF,6+ Mos 11/07/2014, 07/24/2015, 09/14/2018, 07/12/2019, 07/15/2020   Influenza-Unspecified 07/22/2013, 08/10/2016, 08/10/2017   PFIZER(Purple Top)SARS-COV-2 Vaccination 11/22/2019, 12/17/2019, 08/06/2020   PPD Test 12/14/2010   Pneumococcal Conjugate-13 11/07/2017   Pneumococcal Polysaccharide-23 07/10/2000,  08/23/2010   Td 04/09/2002, 01/26/2010   Tdap 03/15/2021    TDAP status: Up to date  Flu Vaccine status: Due, Education has been provided regarding the importance of this vaccine. Advised may receive this vaccine at local pharmacy or Health Dept. Aware to provide a copy of the vaccination record if obtained from local pharmacy or Health Dept. Verbalized acceptance and understanding.  Pneumococcal vaccine status: Up to date  Covid-19 vaccine status: Information provided on how to obtain vaccines.   Qualifies for Shingles Vaccine? Yes   Zostavax completed No   Shingrix Completed?: No.    Education has been provided regarding the importance of this vaccine. Patient has been advised to call insurance company to determine out of pocket expense if they have not yet received this vaccine. Advised may also receive vaccine at local pharmacy or Health Dept. Verbalized acceptance and understanding.  Screening Tests Health Maintenance  Topic Date Due   INFLUENZA VACCINE  05/11/2023   COVID-19 Vaccine (4 - 2023-24 season) 06/11/2023   Zoster Vaccines- Shingrix (1 of 2) 09/19/2023 (Originally 10/14/1978)   Medicare Annual Wellness (AWV)  06/19/2024   DTaP/Tdap/Td (4 - Td or Tdap) 03/16/2031   Pneumonia Vaccine 79+ Years old  Completed   DEXA SCAN  Completed   HPV VACCINES  Aged Out    Health Maintenance  Health Maintenance Due  Topic Date Due   INFLUENZA VACCINE  05/11/2023   COVID-19 Vaccine (4 - 2023-24 season) 06/11/2023    Colorectal cancer screening: No longer required.   Mammogram status: No longer required due to  .  Bone Density status: Completed 208. Results reflect: Bone density results: NORMAL. Repeat every 0 years.  Lung Cancer Screening: (Low Dose CT Chest recommended if Age 22-80 years, 20 pack-year currently smoking OR have quit w/in 15years.) does qualify.   Lung Cancer Screening Referral:   will discuss if she wants just quit smoking  Additional  Screening:  Hepatitis C Screening: does not qualify;  Vision Screening: Recommended annual ophthalmology exams for early detection of glaucoma and other disorders of the eye. Is the patient up to date with their annual eye exam?  Yes  Who is the provider or what is the name of the office in which the patient attends annual eye exams? Unsure of name If pt is not established with a provider, would they like to be referred to a provider to establish care? No .   Dental Screening: Recommended annual dental exams for proper oral hygiene    Community Resource Referral / Chronic Care Management: CRR required this visit?  No   CCM required this visit?  No     Plan:     I have personally reviewed and noted the following in the patient's chart:   Medical and social history Use of alcohol, tobacco or illicit drugs  Current medications and supplements including opioid prescriptions. Patient is not currently taking opioid prescriptions. Functional ability and status Nutritional status Physical activity Advanced directives List of other physicians Hospitalizations, surgeries, and ER visits in previous 12 months Vitals  Screenings to include cognitive, depression, and falls Referrals and appointments  In addition, I have reviewed and discussed with patient certain preventive protocols, quality metrics, and best practice recommendations. A written personalized care plan for preventive services as well as general preventive health recommendations were provided to patient.     Remi Haggard, LPN   06/23/7828   After Visit Summary: (MyChart) Due to this being a telephonic visit, the after visit summary with patients personalized plan was offered to patient via MyChart   Nurse Notes:   patient stated she has had some stomach pain , not Acute it is ongoing, states it is off and on for a couple weeks feels it is due to Bowel movements she states she usually goes 1-2 times a week which is normal .    No blood stools are normal.  Will call to schedule if persists .

## 2023-06-21 ENCOUNTER — Encounter: Payer: Self-pay | Admitting: *Deleted

## 2023-07-07 ENCOUNTER — Ambulatory Visit: Payer: Medicare Other | Admitting: Student

## 2023-07-10 ENCOUNTER — Ambulatory Visit (INDEPENDENT_AMBULATORY_CARE_PROVIDER_SITE_OTHER): Payer: Medicare Other

## 2023-07-10 VITALS — BP 165/64 | HR 61 | Ht 65.0 in | Wt 138.8 lb

## 2023-07-10 DIAGNOSIS — R1030 Lower abdominal pain, unspecified: Secondary | ICD-10-CM | POA: Diagnosis not present

## 2023-07-10 DIAGNOSIS — R519 Headache, unspecified: Secondary | ICD-10-CM | POA: Diagnosis not present

## 2023-07-10 DIAGNOSIS — Z23 Encounter for immunization: Secondary | ICD-10-CM

## 2023-07-10 LAB — POCT UA - MICROSCOPIC ONLY: RBC, Urine, Miroscopic: NONE SEEN (ref 0–2)

## 2023-07-10 LAB — POCT URINALYSIS DIP (MANUAL ENTRY)
Bilirubin, UA: NEGATIVE
Blood, UA: NEGATIVE
Glucose, UA: NEGATIVE mg/dL
Ketones, POC UA: NEGATIVE mg/dL
Nitrite, UA: NEGATIVE
Protein Ur, POC: NEGATIVE mg/dL
Spec Grav, UA: 1.02 (ref 1.010–1.025)
Urobilinogen, UA: 0.2 U/dL
pH, UA: 5.5 (ref 5.0–8.0)

## 2023-07-10 NOTE — Patient Instructions (Signed)
It was great to see you! Thank you for allowing me to participate in your care!   I recommend that you always bring your medications to each appointment as this makes it easy to ensure we are on the correct medications and helps [place] not miss when refills are needed.  Our plans for today:  - Take miralax for constipation - Keep a journal of when abdomen pain is occurring, what you ate, when you had your last bowel movement, bowel movement textures. - Keep track of when your scalp pain occurs. If it swells, please try to be seen by our office  We are checking some labs today, I will call you if they are abnormal will send you a MyChart message or a letter if they are normal.  If you do not hear about your labs in the next 2 weeks please let us know.  Take care and seek immediate care sooner if you develop any concerns. Please remember to show up 15 minutes before your scheduled appointment time!  Tiffany Kocher, DO [facility]

## 2023-07-10 NOTE — Progress Notes (Signed)
    SUBJECTIVE:   CHIEF COMPLAINT / HPI:   Scalp pain Intermittent sensation on superior parietal scalp.  Not painful, not pruritic, patient unable to quantify exactly how it feels.  She states that she thinks it swells, she then scratches it and it improves within 3 minutes.  No clear aggravating factors.  No new exposures.  Denies trauma to the head.  No fevers, vision change, hearing change, nausea, dizziness.  Bilateral lower quadrant pain Bilateral lower quadrant pain for approximately 2 months.  Had episode of diarrhea 2 months ago, then resolved.  Reports decreased bowel movements, but normal caliber and consistency.  No clear aggravating factors, does not appear to worsen after eating.  Denies bloody stools.  No dysuria urinary frequency.  No systemic symptoms.  No prior colonoscopy.  Pain is intermittent, typically improves with bowel movements.   OBJECTIVE:   BP (!) 165/64   Pulse 61   Ht 5\' 5"  (1.651 m)   Wt 138 lb 12.8 oz (63 kg)   SpO2 98%   BMI 23.10 kg/m    General: NAD, pleasant Cardio: RRR, no MRG. Cap Refill <2s. Respiratory: CTAB, normal wob on RA GI: Abdomen is soft, not distended. Mild tenderness in BL lower quadrant and suprapubic region. BS present Skin: Warm and dry    ASSESSMENT/PLAN:   Assessment & Plan Scalp pain Unclear etiology. Well-appearing and asymptomatic in office. Recommend patient keep a record.  If swelling reoccurs, recommend seeking medical care or obtaining picture. - Continue observation - Follow-up if it fails to improve or worsens Lower abdominal pain Chronic intermittent bilateral lower quadrant pain.  Well-appearing and benign physical exam.  Differential is broad and includes: Constipation urinary tract infection.  UA with trace leukocytes, will obtain urine culture.  I do have concern for possible chronic bowel ischemia, and recommend close follow-up-if not improving would consider imaging.  Based on history and exam low suspicion  for: Inflammatory bowel disease, bowel obstruction, pancreatitis, cholecystitis. -Follow-up urine culture - Follow-up if pain worsens or fails to improve   Tiffany Kocher, DO [facility]

## 2023-07-13 LAB — URINE CULTURE

## 2023-11-23 ENCOUNTER — Other Ambulatory Visit: Payer: Self-pay | Admitting: Student

## 2023-11-23 DIAGNOSIS — I10 Essential (primary) hypertension: Secondary | ICD-10-CM

## 2024-03-15 ENCOUNTER — Emergency Department (HOSPITAL_COMMUNITY)

## 2024-03-15 ENCOUNTER — Encounter (HOSPITAL_COMMUNITY): Payer: Self-pay | Admitting: Internal Medicine

## 2024-03-15 ENCOUNTER — Inpatient Hospital Stay (HOSPITAL_COMMUNITY)
Admission: EM | Admit: 2024-03-15 | Discharge: 2024-03-22 | DRG: 330 | Disposition: A | Discharge status: Home Health | Attending: Internal Medicine | Admitting: Internal Medicine

## 2024-03-15 ENCOUNTER — Other Ambulatory Visit: Payer: Self-pay

## 2024-03-15 DIAGNOSIS — I2510 Atherosclerotic heart disease of native coronary artery without angina pectoris: Secondary | ICD-10-CM | POA: Diagnosis not present

## 2024-03-15 DIAGNOSIS — K561 Intussusception: Principal | ICD-10-CM | POA: Diagnosis present

## 2024-03-15 DIAGNOSIS — I5032 Chronic diastolic (congestive) heart failure: Secondary | ICD-10-CM | POA: Diagnosis present

## 2024-03-15 DIAGNOSIS — Z933 Colostomy status: Secondary | ICD-10-CM

## 2024-03-15 DIAGNOSIS — N179 Acute kidney failure, unspecified: Secondary | ICD-10-CM | POA: Diagnosis not present

## 2024-03-15 DIAGNOSIS — F17210 Nicotine dependence, cigarettes, uncomplicated: Secondary | ICD-10-CM | POA: Diagnosis present

## 2024-03-15 DIAGNOSIS — Z79899 Other long term (current) drug therapy: Secondary | ICD-10-CM

## 2024-03-15 DIAGNOSIS — H8110 Benign paroxysmal vertigo, unspecified ear: Secondary | ICD-10-CM | POA: Diagnosis present

## 2024-03-15 DIAGNOSIS — Z01810 Encounter for preprocedural cardiovascular examination: Secondary | ICD-10-CM | POA: Diagnosis not present

## 2024-03-15 DIAGNOSIS — Z7982 Long term (current) use of aspirin: Secondary | ICD-10-CM | POA: Diagnosis not present

## 2024-03-15 DIAGNOSIS — E782 Mixed hyperlipidemia: Secondary | ICD-10-CM | POA: Diagnosis present

## 2024-03-15 DIAGNOSIS — Z01818 Encounter for other preprocedural examination: Secondary | ICD-10-CM | POA: Diagnosis not present

## 2024-03-15 DIAGNOSIS — I110 Hypertensive heart disease with heart failure: Secondary | ICD-10-CM | POA: Diagnosis present

## 2024-03-15 DIAGNOSIS — I10 Essential (primary) hypertension: Secondary | ICD-10-CM | POA: Diagnosis not present

## 2024-03-15 DIAGNOSIS — K56600 Partial intestinal obstruction, unspecified as to cause: Secondary | ICD-10-CM | POA: Diagnosis present

## 2024-03-15 DIAGNOSIS — C184 Malignant neoplasm of transverse colon: Secondary | ICD-10-CM | POA: Diagnosis present

## 2024-03-15 DIAGNOSIS — G5790 Unspecified mononeuropathy of unspecified lower limb: Secondary | ICD-10-CM | POA: Diagnosis present

## 2024-03-15 DIAGNOSIS — Z833 Family history of diabetes mellitus: Secondary | ICD-10-CM

## 2024-03-15 DIAGNOSIS — I5030 Unspecified diastolic (congestive) heart failure: Secondary | ICD-10-CM | POA: Diagnosis not present

## 2024-03-15 HISTORY — DX: Unspecified diastolic (congestive) heart failure: I50.30

## 2024-03-15 LAB — URINALYSIS, ROUTINE W REFLEX MICROSCOPIC
Bacteria, UA: NONE SEEN
Bilirubin Urine: NEGATIVE
Glucose, UA: NEGATIVE mg/dL
Ketones, ur: NEGATIVE mg/dL
Nitrite: NEGATIVE
Protein, ur: NEGATIVE mg/dL
Specific Gravity, Urine: >1.046 — ABNORMAL HIGH (ref 1.005–1.030)
pH: 6 (ref 5.0–8.0)

## 2024-03-15 LAB — COMPREHENSIVE METABOLIC PANEL WITH GFR
ALT: 11 U/L (ref 0–44)
AST: 17 U/L (ref 15–41)
Albumin: 3.5 g/dL (ref 3.5–5.0)
Alkaline Phosphatase: 58 U/L (ref 38–126)
Anion gap: 9 (ref 5–15)
BUN: 20 mg/dL (ref 8–23)
CO2: 24 mmol/L (ref 22–32)
Calcium: 8.9 mg/dL (ref 8.9–10.3)
Chloride: 106 mmol/L (ref 98–111)
Creatinine, Ser: 1.03 mg/dL — ABNORMAL HIGH (ref 0.44–1.00)
GFR, Estimated: 51 mL/min — ABNORMAL LOW (ref >60)
Glucose, Bld: 113 mg/dL — ABNORMAL HIGH (ref 70–99)
Potassium: 3.8 mmol/L (ref 3.5–5.1)
Sodium: 139 mmol/L (ref 135–145)
Total Bilirubin: 0.9 mg/dL (ref 0.0–1.2)
Total Protein: 6.7 g/dL (ref 6.5–8.1)

## 2024-03-15 LAB — LIPASE, BLOOD: Lipase: 31 U/L (ref 11–51)

## 2024-03-15 LAB — CBC
HCT: 39.2 % (ref 36.0–46.0)
Hemoglobin: 12 g/dL (ref 12.0–15.0)
MCH: 30.2 pg (ref 26.0–34.0)
MCHC: 30.6 g/dL (ref 30.0–36.0)
MCV: 98.5 fL (ref 80.0–100.0)
Platelets: 284 10*3/uL (ref 150–400)
RBC: 3.98 MIL/uL (ref 3.87–5.11)
RDW: 14 % (ref 11.5–15.5)
WBC: 8.2 10*3/uL (ref 4.0–10.5)
nRBC: 0 % (ref 0.0–0.2)

## 2024-03-15 LAB — I-STAT CG4 LACTIC ACID, ED: Lactic Acid, Venous: 0.7 mmol/L (ref 0.5–1.9)

## 2024-03-15 MED ORDER — MORPHINE SULFATE (PF) 2 MG/ML IV SOLN
2.0000 mg | Freq: Once | INTRAVENOUS | Status: AC
  Administered 2024-03-15: 2 mg via INTRAVENOUS
  Filled 2024-03-15: qty 1

## 2024-03-15 MED ORDER — ENOXAPARIN SODIUM 40 MG/0.4ML IJ SOSY
40.0000 mg | PREFILLED_SYRINGE | INTRAMUSCULAR | Status: DC
  Administered 2024-03-15 – 2024-03-18 (×4): 40 mg via SUBCUTANEOUS
  Filled 2024-03-15 (×4): qty 0.4

## 2024-03-15 MED ORDER — ACETAMINOPHEN 325 MG PO TABS
650.0000 mg | ORAL_TABLET | Freq: Four times a day (QID) | ORAL | Status: DC | PRN
  Administered 2024-03-15: 650 mg via ORAL
  Filled 2024-03-15: qty 2

## 2024-03-15 MED ORDER — ONDANSETRON HCL 4 MG PO TABS: 4.0000 mg | ORAL_TABLET | Freq: Four times a day (QID) | ORAL | Status: DC | PRN

## 2024-03-15 MED ORDER — ACETAMINOPHEN 650 MG RE SUPP: 650.0000 mg | Freq: Four times a day (QID) | RECTAL | Status: DC | PRN

## 2024-03-15 MED ORDER — IOHEXOL 300 MG/ML  SOLN
100.0000 mL | Freq: Once | INTRAMUSCULAR | Status: AC | PRN
  Administered 2024-03-15: 100 mL via INTRAVENOUS

## 2024-03-15 MED ORDER — ALBUTEROL SULFATE (2.5 MG/3ML) 0.083% IN NEBU: 2.5000 mg | INHALATION_SOLUTION | RESPIRATORY_TRACT | Status: DC | PRN

## 2024-03-15 MED ORDER — ONDANSETRON HCL 4 MG/2ML IJ SOLN
4.0000 mg | Freq: Four times a day (QID) | INTRAMUSCULAR | Status: DC | PRN
  Administered 2024-03-15: 4 mg via INTRAVENOUS
  Filled 2024-03-15: qty 2

## 2024-03-15 MED ORDER — OXYCODONE HCL 5 MG PO TABS
5.0000 mg | ORAL_TABLET | ORAL | Status: DC | PRN
  Administered 2024-03-15 – 2024-03-22 (×9): 5 mg via ORAL
  Filled 2024-03-15 (×10): qty 1

## 2024-03-15 MED ORDER — SODIUM CHLORIDE 0.9 % IV SOLN
INTRAVENOUS | Status: AC
Start: 2024-03-15 — End: 2024-03-16

## 2024-03-15 MED ORDER — ONDANSETRON HCL 4 MG/2ML IJ SOLN
4.0000 mg | Freq: Once | INTRAMUSCULAR | Status: AC
  Administered 2024-03-15: 4 mg via INTRAVENOUS
  Filled 2024-03-15: qty 2

## 2024-03-15 MED ORDER — MORPHINE SULFATE (PF) 2 MG/ML IV SOLN
2.0000 mg | INTRAVENOUS | Status: DC | PRN
  Administered 2024-03-15 – 2024-03-18 (×3): 2 mg via INTRAVENOUS
  Filled 2024-03-15 (×3): qty 1

## 2024-03-15 MED ORDER — ALUM & MAG HYDROXIDE-SIMETH 200-200-20 MG/5ML PO SUSP
30.0000 mL | Freq: Once | ORAL | Status: AC
  Administered 2024-03-15: 30 mL via ORAL
  Filled 2024-03-15: qty 30

## 2024-03-15 MED ORDER — MORPHINE SULFATE (PF) 2 MG/ML IV SOLN
1.0000 mg | Freq: Once | INTRAVENOUS | Status: AC
  Administered 2024-03-15: 1 mg via INTRAVENOUS
  Filled 2024-03-15: qty 1

## 2024-03-15 NOTE — Plan of Care (Signed)
   Problem: Education: Goal: Knowledge of General Education information will improve Description Including pain rating scale, medication(s)/side effects and non-pharmacologic comfort measures Outcome: Progressing

## 2024-03-15 NOTE — Consult Note (Signed)
 Cheryl Anderson 1929-08-11  [id number].    Requesting MD: Mir Jannette Mend  Chief Complaint/Reason for Consult: Abdominal pain with nausea  HPI: Cheryl Anderson is a 89 y.o. with PMH significant for HTN, CHF, Hyperlipidemia and BPPV who presents to [facility] ED with concerns for abdominal pain w/nausea that started last evening which turns out to be colonic intussusception.  Patient stated that this has been an intermittent issue for the past year and that it typically self resolves w/o medical or surgical intervention. However, the abdominal pain remained constant this time around. Per the patient, the pain is concentrated at the LLQ, suprapubic and RLQ regions of the abdomen. Patient denies having emesis but endorses nausea. Patient also endorses loose stools which she states is not unusual. Last bowel movement and flatulence were last night.  Past Medical History: HTN, CHF, Hyperlipidemia and BPPV. Prior Abdominal Surgeries: None Blood Thinners: None Last PO intake: Last evening (06/05) Last Colonoscopy: Never done Allergies: Codeine  Tobacco Use: None, quit last year Alcohol Use: None Substance use: None    ROS: Review of Systems  Constitutional:  Negative for chills, diaphoresis and fever.  HENT:         Difficulty hearing   Cardiovascular:  Negative for chest pain.  Gastrointestinal:  Positive for abdominal pain, diarrhea and nausea. Negative for vomiting.    Family History  Problem Relation Age of Onset   Cancer Sister    Cancer Brother        Throat CA   Diabetes Daughter     Past Medical History:  Diagnosis Date   BPPV (benign paroxysmal positional vertigo) 03/16/2016   Callus of foot 04/01/2014   CHF (congestive heart failure) (HCC)    Heart disorder    Hyperlipidemia    Hypertension    Leg cramps, sleep related 05/07/2012   Osteoporosis    Plantar fasciitis 04/01/2014   Subcutaneous nodule of chest wall 12/15/2010    Past Surgical History:  Procedure Laterality  Date   No prior surgery      Social History:  reports that she has been smoking cigarettes. She has a 69 pack-year smoking history. She has never used smokeless tobacco. She reports current alcohol use. She reports that she does not use drugs.  Allergies:  Allergies  Allergen Reactions   Codeine  Nausea Only    (Not in a hospital admission)    Physical Exam: Blood pressure (!) 160/66, pulse 80, temperature 97.7 F (36.5 C), temperature source Oral, resp. rate 17, SpO2 99%. Physical Exam Constitutional:      General: She is not in acute distress.    Appearance: She is not diaphoretic.  Cardiovascular:     Rate and Rhythm: Rhythm irregular.  Pulmonary:     Effort: Pulmonary effort is normal.  Abdominal:     General: Abdomen is flat. There is no distension.     Palpations: Abdomen is soft.     Tenderness: There is abdominal tenderness in the right lower quadrant, periumbilical area, suprapubic area and left lower quadrant.     Hernia: No hernia is present.  Skin:    General: Skin is warm and dry.  Neurological:     General: No focal deficit present.     Mental Status: She is alert.      Results for orders placed or performed during the hospital encounter of 03/15/24 (from the past 48 hours)  Lipase, blood     Status: None   Collection Time: 03/15/24  9:57 AM  Result Value Ref Range   Lipase 31 11 - 51 U/L    Comment: Performed at [facility], [address] [place], [place] [address]  Comprehensive metabolic panel     Status: Abnormal   Collection Time: 03/15/24  9:57 AM  Result Value Ref Range   Sodium 139 135 - 145 mmol/L   Potassium 3.8 3.5 - 5.1 mmol/L   Chloride 106 98 - 111 mmol/L   CO2 24 22 - 32 mmol/L   Glucose, Bld 113 (H) 70 - 99 mg/dL    Comment: Glucose reference range applies only to samples taken after fasting for at least 8 hours.   BUN 20 8 - 23 mg/dL   Creatinine, Ser 8.29 (H) 0.44 - 1.00 mg/dL   Calcium  8.9 8.9 - 10.3 mg/dL    Total Protein 6.7 6.5 - 8.1 g/dL   Albumin 3.5 3.5 - 5.0 g/dL   AST 17 15 - 41 U/L   ALT 11 0 - 44 U/L   Alkaline Phosphatase 58 38 - 126 U/L   Total Bilirubin 0.9 0.0 - 1.2 mg/dL   GFR, Estimated 51 (L) >60 mL/min    Comment: (NOTE) Calculated using the CKD-EPI Creatinine Equation (2021)    Anion gap 9 5 - 15    Comment: Performed at [facility], [address], [place], [place] [address]  CBC     Status: None   Collection Time: 03/15/24  9:57 AM  Result Value Ref Range   WBC 8.2 4.0 - 10.5 K/uL   RBC 3.98 3.87 - 5.11 MIL/uL   Hemoglobin 12.0 12.0 - 15.0 g/dL   HCT 08.6 57.8 - 46.9 %   MCV 98.5 80.0 - 100.0 fL   MCH 30.2 26.0 - 34.0 pg   MCHC 30.6 30.0 - 36.0 g/dL   RDW 62.9 52.8 - 41.3 %   Platelets 284 150 - 400 K/uL   nRBC 0.0 0.0 - 0.2 %    Comment: Performed at [facility], [address] [place], [place] [address]  I-Stat CG4 Lactic Acid     Status: None   Collection Time: 03/15/24 11:26 AM  Result Value Ref Range   Lactic Acid, Venous 0.7 0.5 - 1.9 mmol/L   CT ABDOMEN PELVIS W CONTRAST Result Date: 03/15/2024 CLINICAL DATA:  Bowel obstruction suspected EXAM: CT ABDOMEN AND PELVIS WITH CONTRAST TECHNIQUE: Multidetector CT imaging of the abdomen and pelvis was performed using the standard protocol following bolus administration of intravenous contrast. RADIATION DOSE REDUCTION: This exam was performed according to the departmental dose-optimization program which includes automated exposure control, adjustment of the mA and/or kV according to patient size and/or use of iterative reconstruction technique. CONTRAST:  100mL OMNIPAQUE IOHEXOL 300 MG/ML  SOLN COMPARISON:  None Available. FINDINGS: Lower chest: No focal consolidation or pulmonary nodule in the lung bases. No pleural effusion or pneumothorax demonstrated. Partially imaged heart size is normal. Hepatobiliary: No focal hepatic lesions. No intra or extrahepatic biliary ductal  dilation. Normal gallbladder. Pancreas: No focal lesions or main ductal dilation. Spleen: Normal in size without focal abnormality. Adrenals/Urinary Tract: No adrenal nodules. Bilateral renal cysts, including an upper pole left renal cyst measuring 1.4 cm (2:20) which demonstrates intermittent density. A smaller, subcentimeter focus in the anterior left interpolar kidney (2:25) also appears mildly hyperattenuating. No hydronephrosis or calculi. No focal bladder wall thickening. Stomach/Bowel: Normal appearance of the stomach. Colonic intussusception involving the mid transverse colon with intussusceptum extending  to the level of the distal descending colon, where there is lobulated enhancing tissue (2:53). Mild gas-filled dilation of the upstream proximal transverse colon. Normal appendix. Vascular/Lymphatic: Aortic atherosclerosis. No enlarged abdominal or pelvic lymph nodes. Reproductive: No adnexal masses. Other: Small volume pelvic free fluid. No free air or fluid collection. Musculoskeletal: No acute or abnormal lytic or blastic osseous lesions. Multilevel degenerative changes of the partially imaged thoracic and lumbar spine. Grade 1 anterolisthesis at L4-5. Small fat-containing right inguinal hernia. IMPRESSION: 1. Colonic intussusception involving the mid transverse colon with intussusceptum extending to the level of the distal descending colon, where there is lobulated enhancing tissue, which may represent neoplasm acting as a lead point. 2. Resulting obstruction with mild gas-filled dilation of the upstream proximal transverse colon. 3. Bilateral renal cysts, including an upper pole left renal cyst measuring 1.4 cm which demonstrates intermittent density. A smaller, subcentimeter focus in the anterior left interpolar kidney also appears mildly hyperattenuating. These may represent hemorrhagic or proteinaceous cysts, but are incompletely characterized. Recommend further evaluation with nonemergent renal  protocol MRI or CT abdomen. 4.  Aortic Atherosclerosis (ICD10-I70.0). Electronically Signed   By: Limin  Xu M.D.   On: 03/15/2024 12:01    Anti-infectives (From admission, onward)    None       Assessment/Plan Colonic intussusception involving the mid transverse colon  Patient is HDS and labs WNL. CT shows Colonic intussusception involving the mid transverse colon causing LBO w/o evidence of severe Cecal dilation/small bowel dilation and w/o evidence of perforation. This is concerning for a lead point d/t colonic mass. I do not think pt would tolerate a bowel prep. Pt will need a partial colectomy and end colostomy.  Plan is to consult cardiology for cardiac clearance considering patient's cardiac hx. Will discuss further with my attending.    FEN - NPO VTE - Lovenox, SCDs ID - None Foley -None Dispo - Admit to [facility].   I reviewed nursing notes, ED provider notes, last 48 h intake and output, last 24 h labs and trends, and last 24 h imaging results.  This care required high  level of medical decision making.   Mirta Ammon, PA-C [facility] Surgery 03/15/2024, 2:16 PM Please see Amion for pager number during day hours 7:00am-4:30pm

## 2024-03-15 NOTE — H&P (Signed)
 History and Physical  IVIONA HOLE Anderson:[id number] DOB: March 18, 1929 DOA: 03/15/2024  PCP: Clem Currier, DO   Chief Complaint: Abdominal pain, nausea  HPI: Cheryl Anderson is a 89 y.o. female with medical history significant for hypertension, hyperlipidemia, CHF, BPPV being admitted to the hospital with abdominal pain and nausea found to have colonic intussusception.  History is provided by the patient as well as her daughter who is at the bedside, they state that over the last year she has had intermittent bouts of lower abdominal pain with associated mild nausea.  These typically were not incredibly severe, and got better in a few hours if she cut back on her p.o. intake.  In the last 24 to 48 hours, she has had similar pain, but it has been more intense and unrelenting.  For this reason, she came to the ER for evaluation.  She has had a small loose bowel movement yesterday, is not sure if she has continued to pass gas.  She has some associated nausea, but has not vomited.  She denies any fevers or chills, patient and daughter deny any black stools or unintentional weight loss.  Review of Systems: Please see HPI for pertinent positives and negatives. A complete 10 system review of systems are otherwise negative.  Past Medical History:  Diagnosis Date   BPPV (benign paroxysmal positional vertigo) 03/16/2016   Callus of foot 04/01/2014   CHF (congestive heart failure) (HCC)    Heart disorder    Hyperlipidemia    Hypertension    Leg cramps, sleep related 05/07/2012   Osteoporosis    Plantar fasciitis 04/01/2014   Subcutaneous nodule of chest wall 12/15/2010   Past Surgical History:  Procedure Laterality Date   No prior surgery     Social History:  reports that she has been smoking cigarettes. She has a 69 pack-year smoking history. She has never used smokeless tobacco. She reports current alcohol use. She reports that she does not use drugs.  Allergies  Allergen Reactions   Codeine  Nausea  Only    Family History  Problem Relation Age of Onset   Cancer Sister    Cancer Brother        Throat CA   Diabetes Daughter      Prior to Admission medications   Medication Sig Start Date End Date Taking? Authorizing Provider  aspirin EC 81 MG tablet Take 1 tablet by mouth daily.    [provider]  atorvastatin  (LIPITOR) 80 MG tablet TAKE 1 TABLET BY MOUTH EVERY DAY 07/25/22   Clem Currier, DO  brimonidine-timolol (COMBIGAN) 0.2-0.5 % ophthalmic solution Place 1 drop into both eyes every 12 (twelve) hours.    [provider]  fluticasone  (FLONASE ) 50 MCG/ACT nasal spray SPRAY 2 SPRAYS INTO EACH NOSTRIL EVERY DAY Patient taking differently: Place 1 spray into both nostrils daily as needed for allergies or rhinitis. 09/06/19   Valli Gaw, MD  furosemide  (LASIX ) 40 MG tablet TAKE 1 TABLET BY MOUTH EVERY DAY 01/05/23   Clem Currier, DO  isosorbide  mononitrate (IMDUR ) 30 MG 24 hr tablet TAKE 1 TABLET BY MOUTH EVERY DAY 04/06/23   Clem Currier, DO  nitroGLYCERIN  (NITROSTAT ) 0.4 MG SL tablet Take every 5 minutes for 3 doses. If pain not resolved, call 911 or go to ER. 05/16/22   Candee Cha, MD  ramipril  (ALTACE ) 5 MG capsule TAKE 1 CAPSULE BY MOUTH EVERY DAY 11/23/23   Clem Currier, DO  risedronate  (ACTONEL ) 35 MG tablet Take 1 tablet (  35 mg total) by mouth every 7 (seven) days. with water on empty stomach, nothing by mouth or lie down for next 30 minutes. 02/12/19   Claire Crick, DO    Physical Exam: BP (!) 160/71 (BP Location: Left Arm)   Pulse 83   Temp 97.8 F (36.6 C) (Oral)   Resp 16   SpO2 99%  General:  Alert, oriented, calm, in no acute distress, she looks about 25 years younger than her stated age Cardiovascular: RRR, no murmurs or rubs, no peripheral edema  Respiratory: clear to auscultation bilaterally, no wheezes, no crackles  Abdomen: soft, nontender, minimally distended, normal bowel tones heard  Skin: dry, no rashes  Musculoskeletal: no joint  effusions, normal range of motion  Psychiatric: appropriate affect, normal speech  Neurologic: extraocular muscles intact, clear speech, moving all extremities with intact sensorium         Labs on Admission:  Basic Metabolic Panel: Recent Labs  Lab 03/15/24 0957  NA 139  K 3.8  CL 106  CO2 24  GLUCOSE 113*  BUN 20  CREATININE 1.03*  CALCIUM  8.9   Liver Function Tests: Recent Labs  Lab 03/15/24 0957  AST 17  ALT 11  ALKPHOS 58  BILITOT 0.9  PROT 6.7  ALBUMIN 3.5   Recent Labs  Lab 03/15/24 0957  LIPASE 31   No results for input(s): "AMMONIA" in the last 168 hours. CBC: Recent Labs  Lab 03/15/24 0957  WBC 8.2  HGB 12.0  HCT 39.2  MCV 98.5  PLT 284   Cardiac Enzymes: No results for input(s): "CKTOTAL", "CKMB", "CKMBINDEX", "TROPONINI" in the last 168 hours. BNP (last 3 results) No results for input(s): "BNP" in the last 8760 hours.  ProBNP (last 3 results) No results for input(s): "PROBNP" in the last 8760 hours.  CBG: No results for input(s): "GLUCAP" in the last 168 hours.  Radiological Exams on Admission: CT ABDOMEN PELVIS W CONTRAST Result Date: 03/15/2024 CLINICAL DATA:  Bowel obstruction suspected EXAM: CT ABDOMEN AND PELVIS WITH CONTRAST TECHNIQUE: Multidetector CT imaging of the abdomen and pelvis was performed using the standard protocol following bolus administration of intravenous contrast. RADIATION DOSE REDUCTION: This exam was performed according to the departmental dose-optimization program which includes automated exposure control, adjustment of the mA and/or kV according to patient size and/or use of iterative reconstruction technique. CONTRAST:  100mL OMNIPAQUE IOHEXOL 300 MG/ML  SOLN COMPARISON:  None Available. FINDINGS: Lower chest: No focal consolidation or pulmonary nodule in the lung bases. No pleural effusion or pneumothorax demonstrated. Partially imaged heart size is normal. Hepatobiliary: No focal hepatic lesions. No intra or  extrahepatic biliary ductal dilation. Normal gallbladder. Pancreas: No focal lesions or main ductal dilation. Spleen: Normal in size without focal abnormality. Adrenals/Urinary Tract: No adrenal nodules. Bilateral renal cysts, including an upper pole left renal cyst measuring 1.4 cm (2:20) which demonstrates intermittent density. A smaller, subcentimeter focus in the anterior left interpolar kidney (2:25) also appears mildly hyperattenuating. No hydronephrosis or calculi. No focal bladder wall thickening. Stomach/Bowel: Normal appearance of the stomach. Colonic intussusception involving the mid transverse colon with intussusceptum extending to the level of the distal descending colon, where there is lobulated enhancing tissue (2:53). Mild gas-filled dilation of the upstream proximal transverse colon. Normal appendix. Vascular/Lymphatic: Aortic atherosclerosis. No enlarged abdominal or pelvic lymph nodes. Reproductive: No adnexal masses. Other: Small volume pelvic free fluid. No free air or fluid collection. Musculoskeletal: No acute or abnormal lytic or blastic osseous lesions. Multilevel degenerative changes  of the partially imaged thoracic and lumbar spine. Grade 1 anterolisthesis at L4-5. Small fat-containing right inguinal hernia. IMPRESSION: 1. Colonic intussusception involving the mid transverse colon with intussusceptum extending to the level of the distal descending colon, where there is lobulated enhancing tissue, which may represent neoplasm acting as a lead point. 2. Resulting obstruction with mild gas-filled dilation of the upstream proximal transverse colon. 3. Bilateral renal cysts, including an upper pole left renal cyst measuring 1.4 cm which demonstrates intermittent density. A smaller, subcentimeter focus in the anterior left interpolar kidney also appears mildly hyperattenuating. These may represent hemorrhagic or proteinaceous cysts, but are incompletely characterized. Recommend further  evaluation with nonemergent renal protocol MRI or CT abdomen. 4.  Aortic Atherosclerosis (ICD10-I70.0). Electronically Signed   By: Limin  Xu M.D.   On: 03/15/2024 12:01   Assessment/Plan VELECIA OVITT is a 89 y.o. female with medical history significant for hypertension, hyperlipidemia, CHF, BPPV being admitted to the hospital with abdominal pain and nausea found to have colonic intussusception.    Colonic intussusception-with suspicion of possible colonic neoplasm seen on CT. -Inpatient admission -Pain and nausea control -N.p.o. except for sips with meds -IV fluids -General Surgery consult -NG tube in case of severe nausea  Hypertension-Imdur   Hyperlipidemia-Lipitor  DVT prophylaxis: Lovenox     Code Status: Full Code  Consults called: ER provider has consulted general surgery  Admission status: The appropriate patient status for this patient is INPATIENT. Inpatient status is judged to be reasonable and necessary in order to provide the required intensity of service to ensure the patient's safety. The patient's presenting symptoms, physical exam findings, and initial radiographic and laboratory data in the context of their chronic comorbidities is felt to place them at high risk for further clinical deterioration. Furthermore, it is not anticipated that the patient will be medically stable for discharge from the hospital within 2 midnights of admission.    I certify that at the point of admission it is my clinical judgment that the patient will require inpatient hospital care spanning beyond 2 midnights from the point of admission due to high intensity of service, high risk for further deterioration and high frequency of surveillance required  Time spent: 49 minutes  Hassell Patras Rickey Charm MD [facility] Pager [phone]  If 7PM-7AM, please contact night-coverage [email or web address] Password [email or web address]  03/15/2024, 1:08 PM

## 2024-03-15 NOTE — ED Provider Notes (Signed)
 [facility] E[facility] GENERAL SURGERY Provider Note   CSN: [id number] Arrival date & time: 03/15/24  0941     History  Chief Complaint  Patient presents with   Abdominal Pain    Pt complains of lower quadrant abdominal pain that began last evening has been having increased loose bowel movements over the past week. Took pepto bismo and miralax  last evening, denies having to strain, but endorses she is getting less and less amount of stool voided. Has hx of this happening to pt     Cheryl Anderson is a 89 y.o. female.  This is a 89 year old female present emergency department for lower extremity abdominal pain.  Has seemingly had issues on and off for the past several months.  Worsening symptoms for the past 2 days.  Increased nausea with intake, but no vomiting.  Had a bowel movement this morning that was soft.  Worsening abdominal pain overnight.   Abdominal Pain      Home Medications Prior to Admission medications   Medication Sig Start Date End Date Taking? Authorizing Provider  aspirin EC 81 MG tablet Take 1 tablet by mouth daily.    [provider]  atorvastatin  (LIPITOR) 80 MG tablet TAKE 1 TABLET BY MOUTH EVERY DAY 07/25/22   Clem Currier, DO  brimonidine-timolol (COMBIGAN) 0.2-0.5 % ophthalmic solution Place 1 drop into both eyes every 12 (twelve) hours.    [provider]  fluticasone  (FLONASE ) 50 MCG/ACT nasal spray SPRAY 2 SPRAYS INTO EACH NOSTRIL EVERY DAY Patient taking differently: Place 1 spray into both nostrils daily as needed for allergies or rhinitis. 09/06/19   Valli Gaw, MD  furosemide  (LASIX ) 40 MG tablet TAKE 1 TABLET BY MOUTH EVERY DAY 01/05/23   Clem Currier, DO  isosorbide  mononitrate (IMDUR ) 30 MG 24 hr tablet TAKE 1 TABLET BY MOUTH EVERY DAY 04/06/23   Clem Currier, DO  nitroGLYCERIN  (NITROSTAT ) 0.4 MG SL tablet Take every 5 minutes for 3 doses. If pain not resolved, call 911 or go to ER. 05/16/22   Candee Cha, MD  ramipril   (ALTACE ) 5 MG capsule TAKE 1 CAPSULE BY MOUTH EVERY DAY 11/23/23   Clem Currier, DO  risedronate  (ACTONEL ) 35 MG tablet Take 1 tablet (35 mg total) by mouth every 7 (seven) days. with water on empty stomach, nothing by mouth or lie down for next 30 minutes. 02/12/19   Yoo, Elsia J, DO      Allergies    Codeine     Review of Systems   Review of Systems  Gastrointestinal:  Positive for abdominal pain.    Physical Exam Updated Vital Signs BP (!) 160/72 (BP Location: Left Arm)   Pulse 79   Temp 98 F (36.7 C)   Resp 17   SpO2 97%  Physical Exam Vitals and nursing note reviewed.  Constitutional:      General: She is not in acute distress.    Appearance: She is not toxic-appearing.  HENT:     Head: Normocephalic.  Cardiovascular:     Rate and Rhythm: Normal rate and regular rhythm.  Pulmonary:     Effort: Pulmonary effort is normal.     Breath sounds: Normal breath sounds.  Abdominal:     Palpations: Abdomen is soft.     Tenderness: There is abdominal tenderness in the right lower quadrant, suprapubic area and left lower quadrant.  Skin:    General: Skin is warm and dry.  Neurological:     Mental Status: She is alert.  ED Results / Procedures / Treatments   Labs (all labs ordered are listed, but only abnormal results are displayed) Labs Reviewed  COMPREHENSIVE METABOLIC PANEL WITH GFR - Abnormal; Notable for the following components:      Result Value   Glucose, Bld 113 (*)    Creatinine, Ser 1.03 (*)    GFR, Estimated 51 (*)    All other components within normal limits  URINALYSIS, ROUTINE W REFLEX MICROSCOPIC - Abnormal; Notable for the following components:   Specific Gravity, Urine >1.046 (*)    Hgb urine dipstick SMALL (*)    Leukocytes,Ua TRACE (*)    All other components within normal limits  LIPASE, BLOOD  CBC  I-STAT CG4 LACTIC ACID, ED  I-STAT CG4 LACTIC ACID, ED    EKG EKG Interpretation Date/Time:  Friday March 15 2024 10:07:30 EDT Ventricular  Rate:  69 PR Interval:  183 QRS Duration:  86 QT Interval:  379 QTC Calculation: 406 R Axis:   70  Text Interpretation: Sinus rhythm Atrial premature complexes Probable left atrial enlargement No significant change since last tracing Confirmed by Trish Furl [phone]) on 03/15/2024 10:12:05 AM  Radiology CT ABDOMEN PELVIS W CONTRAST Result Date: 03/15/2024 CLINICAL DATA:  Bowel obstruction suspected EXAM: CT ABDOMEN AND PELVIS WITH CONTRAST TECHNIQUE: Multidetector CT imaging of the abdomen and pelvis was performed using the standard protocol following bolus administration of intravenous contrast. RADIATION DOSE REDUCTION: This exam was performed according to the departmental dose-optimization program which includes automated exposure control, adjustment of the mA and/or kV according to patient size and/or use of iterative reconstruction technique. CONTRAST:  100mL OMNIPAQUE IOHEXOL 300 MG/ML  SOLN COMPARISON:  None Available. FINDINGS: Lower chest: No focal consolidation or pulmonary nodule in the lung bases. No pleural effusion or pneumothorax demonstrated. Partially imaged heart size is normal. Hepatobiliary: No focal hepatic lesions. No intra or extrahepatic biliary ductal dilation. Normal gallbladder. Pancreas: No focal lesions or main ductal dilation. Spleen: Normal in size without focal abnormality. Adrenals/Urinary Tract: No adrenal nodules. Bilateral renal cysts, including an upper pole left renal cyst measuring 1.4 cm (2:20) which demonstrates intermittent density. A smaller, subcentimeter focus in the anterior left interpolar kidney (2:25) also appears mildly hyperattenuating. No hydronephrosis or calculi. No focal bladder wall thickening. Stomach/Bowel: Normal appearance of the stomach. Colonic intussusception involving the mid transverse colon with intussusceptum extending to the level of the distal descending colon, where there is lobulated enhancing tissue (2:53). Mild gas-filled dilation of the  upstream proximal transverse colon. Normal appendix. Vascular/Lymphatic: Aortic atherosclerosis. No enlarged abdominal or pelvic lymph nodes. Reproductive: No adnexal masses. Other: Small volume pelvic free fluid. No free air or fluid collection. Musculoskeletal: No acute or abnormal lytic or blastic osseous lesions. Multilevel degenerative changes of the partially imaged thoracic and lumbar spine. Grade 1 anterolisthesis at L4-5. Small fat-containing right inguinal hernia. IMPRESSION: 1. Colonic intussusception involving the mid transverse colon with intussusceptum extending to the level of the distal descending colon, where there is lobulated enhancing tissue, which may represent neoplasm acting as a lead point. 2. Resulting obstruction with mild gas-filled dilation of the upstream proximal transverse colon. 3. Bilateral renal cysts, including an upper pole left renal cyst measuring 1.4 cm which demonstrates intermittent density. A smaller, subcentimeter focus in the anterior left interpolar kidney also appears mildly hyperattenuating. These may represent hemorrhagic or proteinaceous cysts, but are incompletely characterized. Recommend further evaluation with nonemergent renal protocol MRI or CT abdomen. 4.  Aortic Atherosclerosis (ICD10-I70.0). Electronically Signed  By: Limin  Xu M.D.   On: 03/15/2024 12:01    Procedures Procedures    Medications Ordered in ED Medications  enoxaparin (LOVENOX) injection 40 mg (has no administration in time range)  0.9 %  sodium chloride  infusion ( Intravenous New Bag/Given 03/15/24 1347)  acetaminophen  (TYLENOL ) tablet 650 mg (has no administration in time range)    Or  acetaminophen  (TYLENOL ) suppository 650 mg (has no administration in time range)  oxyCODONE (Oxy IR/ROXICODONE) immediate release tablet 5 mg (5 mg Oral Given 03/15/24 1515)  morphine (PF) 2 MG/ML injection 2 mg (has no administration in time range)  ondansetron (ZOFRAN) tablet 4 mg (has no  administration in time range)    Or  ondansetron (ZOFRAN) injection 4 mg (has no administration in time range)  albuterol (PROVENTIL) (2.5 MG/3ML) 0.083% nebulizer solution 2.5 mg (has no administration in time range)  morphine (PF) 2 MG/ML injection 1 mg (1 mg Intravenous Given 03/15/24 1122)  ondansetron (ZOFRAN) injection 4 mg (4 mg Intravenous Given 03/15/24 1122)  alum & mag hydroxide-simeth (MAALOX/MYLANTA) 200-200-20 MG/5ML suspension 30 mL (30 mLs Oral Given 03/15/24 1122)  iohexol (OMNIPAQUE) 300 MG/ML solution 100 mL (100 mLs Intravenous Contrast Given 03/15/24 1133)  morphine (PF) 2 MG/ML injection 2 mg (2 mg Intravenous Given 03/15/24 1347)    ED Course/ Medical Decision Making/ A&P Clinical Course as of 03/15/24 1538  Fri Mar 15, 2024  1022 Per PCP note in September 2024 " Lower abdominal pain Chronic intermittent bilateral lower quadrant pain.  Well-appearing and benign physical exam.  Differential is broad and includes: Constipation urinary tract infection.  UA with trace leukocytes, will obtain urine culture.  I do have concern for possible chronic bowel ischemia, and recommend close follow-up-if not improving would consider imaging.  Based on history and exam low suspicion for: Inflammatory bowel disease, bowel obstruction, pancreatitis, cholecystitis. -Follow-up urine culture - Follow-up if pain worsens or fails to improve "  On further review does not look like she had any imaging done of abdomen. [TY]  1204 CT ABDOMEN PELVIS W CONTRAST IMPRESSION: 1. Colonic intussusception involving the mid transverse colon with intussusceptum extending to the level of the distal descending colon, where there is lobulated enhancing tissue, which may represent neoplasm acting as a lead point. 2. Resulting obstruction with mild gas-filled dilation of the upstream proximal transverse colon. 3. Bilateral renal cysts, including an upper pole left renal cyst measuring 1.4 cm which demonstrates  intermittent density. A smaller, subcentimeter focus in the anterior left interpolar kidney also appears mildly hyperattenuating. These may represent hemorrhagic or proteinaceous cysts, but are incompletely characterized. Recommend further evaluation with nonemergent renal protocol MRI or CT abdomen. 4.  Aortic Atherosclerosis (ICD10-I70.0).   [TY]  1205 Gen surgery consult placed for intussusception.  [TY]    Clinical Course User Index [TY] Rolinda Climes, DO                                 Medical Decision Making 89 year old female presenting emergency department for lower abdominal pain.  Afebrile nontachycardic, slightly hypertensive.  Physical exam with tender lower abdomen, but soft.  Labs largely reassuring with no elevated white count, no anemia.  No significant metabolic derangements.  No transaminitis.  Lipase not elevated.  Pancreatitis unlikely. lactate not elevated; bowel ischemia less likely.  CT scan with intussusception with possible partial SBO.  Patient was in significant distress received IV morphine.  Case  discussed with surgery recommending medicine admission.  Surgery will evaluate patient and make further recommendations.  Had long goals of care discussion with daughter and patient regarding CODE STATUS.  Would like to remain full code at this time.  Will admit for intussusception and partial SBO distended.  Amount and/or Complexity of Data Reviewed Labs: ordered. Radiology: ordered. Decision-making details documented in ED Course.  Risk OTC drugs. Prescription drug management. Decision regarding hospitalization.         Final Clinical Impression(s) / ED Diagnoses Final diagnoses:  Intussusception [facility])    Rx / DC Orders ED Discharge Orders     None         Rolinda Climes, DO 03/15/24 1538

## 2024-03-15 NOTE — Consult Note (Signed)
 CARDIOLOGY CONSULTATION  Patient ID: Cheryl Anderson; [id number]; 04-Jan-1929   Admit date: 03/15/2024 Date of Consult: 03/16/2024  Primary Care Provider: Clem Currier, DO Primary Cardiologist: Alexandria Angel, MD  HISTORY OF PRESENT ILLNESS  Cheryl Anderson is a 89 y.o. female currently admitted with worsening abdominal discomfort and loose stools with CT of the abdomen showing colonic intussusception involving the mid transverse colon causing bowel obstruction with evidence of cecal dilatation and small bowel dilatation, also concern for possible associated mass.  She has been evaluated the surgical team with plan for potential partial colectomy with colostomy or ileostomy.  Cardiology consulted by Dr. Sofia Dunn for preoperative evaluation.  I spoke with the patient and her daughter today.  They live in the same home, Cheryl Anderson is functional with ADLs including cooking, dressing, things around the house.  She does not report any exertional chest pain or unusual shortness of breath with typical activities.  She has had issues with recurring leg edema for quite some time.  She is on Lasix  40 mg daily at home along with Altace  5 mg daily.  RCRI perioperative cardiac risk index is 2 points, 10.1% chance of major adverse cardiac event, overall intermediate risk at least, not considering advanced age.  She follows with Dr. Audery Blazing, was last seen in the office in September 2023.  Cardiac history includes HFpEF with associated hypertension and hyperlipidemia, no definite history of ischemic heart disease (negative Myoview in 2007).  LVEF was 60 to 65% as of 2023.  No major valvular disease at that time.  ROS  Pertinent review in history of present illness.  Past Medical History:  Diagnosis Date   (HFpEF) heart failure with preserved ejection fraction (HCC)    BPPV (benign paroxysmal positional vertigo) 03/16/2016   Callus of foot 04/01/2014   Hyperlipidemia    Hypertension    Leg cramps, sleep  related 05/07/2012   Osteoporosis    Plantar fasciitis 04/01/2014   Subcutaneous nodule of chest wall 12/15/2010    Past Surgical History:  Procedure Laterality Date   No prior surgery       INPATIENT MEDICATIONS Scheduled Meds:  enoxaparin  (LOVENOX ) injection  40 mg Subcutaneous Q24H   Continuous Infusions:  sodium chloride  100 mL/hr at 03/16/24 0845   PRN Meds: acetaminophen  **OR** acetaminophen , albuterol , morphine  injection, ondansetron  **OR** ondansetron  (ZOFRAN ) IV, oxyCODONE   ALLERGIES Allergies  Allergen Reactions   Codeine  Nausea Only    SOCIAL HISTORY  Social History   Tobacco Use   Smoking status: Every Day    Current packs/day: 1.00    Average packs/day: 1 pack/day for 69.0 years (69.0 ttl pk-yrs)    Types: Cigarettes   Smokeless tobacco: Never  Substance Use Topics   Alcohol use: Yes    Alcohol/week: 0.0 standard drinks of alcohol    Comment: OCCASIONALLY    FAMILY HISTORY   The patient's family history includes Cancer in her brother and sister; Diabetes in her daughter.  PHYSICAL EXAM & DATA  Vitals:   03/15/24 2253 03/15/24 2308 03/16/24 0257 03/16/24 0600  BP: (!) 183/78 (!) 160/102 (!) 160/77 (!) 180/69  Pulse: 79 78 81 72  Resp: 17 17 17 17   Temp: 98.5 F (36.9 C) 98.5 F (36.9 C) 98.5 F (36.9 C) 98.7 F (37.1 C)  TempSrc: Oral Oral Oral Oral  SpO2: 94% 97% 94% 96%    Intake/Output Summary (Last 24 hours) at 03/16/2024 0903 Last data filed at 03/16/2024 0902 Gross per 24 hour  Intake 785.85 ml  Output 0 ml  Net 785.85 ml   There were no vitals filed for this visit. There is no height or weight on file to calculate BMI.   Gen: Patient appears comfortable at rest. HEENT: Conjunctiva and lids normal. Neck: Supple, no elevated JVP or carotid bruits. Lungs: Clear to auscultation, nonlabored breathing at rest. Cardiac: Regular rate and rhythm with ectopy, no S3, 1/6 systolic murmur, no pericardial rub. Abdomen: Soft, mildly tender,  no rebound, decreased bowel sounds. Extremities: Mild lower leg and ankle edema. Skin: Warm and dry. Musculoskeletal: No kyphosis. Neuropsychiatric: Alert and oriented x3, affect grossly appropriate.  EKG:  An ECG dated 03/15/2024 was personally reviewed today and demonstrated:  Sinus rhythm with left atrial enlargement, PACs.  Telemetry: Currently not on telemetry.  RELEVANT CV STUDIES  Echocardiogram 11/16/2021:  1. Left ventricular ejection fraction, by estimation, is 60 to 65%. The  left ventricle has normal function. The left ventricle has no regional  wall motion abnormalities. Left ventricular diastolic parameters are  consistent with Grade I diastolic  dysfunction (impaired relaxation).   2. Right ventricular systolic function is normal. The right ventricular  size is normal. There is normal pulmonary artery systolic pressure.   3. The mitral valve is normal in structure. No evidence of mitral valve  regurgitation. No evidence of mitral stenosis.   4. The aortic valve is tricuspid. Aortic valve regurgitation is not  visualized. No aortic stenosis is present.   5. The inferior vena cava is normal in size with greater than 50%  respiratory variability, suggesting right atrial pressure of 3 mmHg.   LABORATORY DATA  Chemistry Recent Labs  Lab 03/15/24 0957 03/16/24 0454  NA 139 139  K 3.8 4.3  CL 106 109  CO2 24 24  GLUCOSE 113* 102*  BUN 20 15  CREATININE 1.03* 1.07*  CALCIUM  8.9 8.6*  GFRNONAA 51* 48*  ANIONGAP 9 6    Recent Labs  Lab 03/15/24 0957  PROT 6.7  ALBUMIN 3.5  AST 17  ALT 11  ALKPHOS 58  BILITOT 0.9   Hematology Recent Labs  Lab 03/15/24 0957 03/16/24 0454  WBC 8.2 7.1  RBC 3.98 3.86*  HGB 12.0 11.8*  HCT 39.2 38.3  MCV 98.5 99.2  MCH 30.2 30.6  MCHC 30.6 30.8  RDW 14.0 13.9  PLT 284 265    Lipid Panel     Component Value Date/Time   CHOL 234 (H) 07/29/2021 1152   TRIG 65 07/29/2021 1152   HDL 58 07/29/2021 1152   CHOLHDL 4.0  07/29/2021 1152   CHOLHDL 4.8 07/25/2013 1234   VLDL 19 07/25/2013 1234   LDLCALC 165 (H) 07/29/2021 1152   LABVLDL 11 07/29/2021 1152    RADIOLOGY/STUDIES CT ABDOMEN PELVIS W CONTRAST Result Date: 03/15/2024 CLINICAL DATA:  Bowel obstruction suspected EXAM: CT ABDOMEN AND PELVIS WITH CONTRAST TECHNIQUE: Multidetector CT imaging of the abdomen and pelvis was performed using the standard protocol following bolus administration of intravenous contrast. RADIATION DOSE REDUCTION: This exam was performed according to the departmental dose-optimization program which includes automated exposure control, adjustment of the mA and/or kV according to patient size and/or use of iterative reconstruction technique. CONTRAST:  OMNIPAQUE  IOHEXOL  300 MG/ML  SOLN COMPARISON:  None Available. FINDINGS: Lower chest: No focal consolidation or pulmonary nodule in the lung bases. No pleural effusion or pneumothorax demonstrated. Partially imaged heart size is normal. Hepatobiliary: No focal hepatic lesions. No intra or extrahepatic biliary ductal dilation. Normal gallbladder. Pancreas: No focal lesions or  main ductal dilation. Spleen: Normal in size without focal abnormality. Adrenals/Urinary Tract: No adrenal nodules. Bilateral renal cysts, including an upper pole left renal cyst measuring 1.4 cm (2:20) which demonstrates intermittent density. A smaller, subcentimeter focus in the anterior left interpolar kidney (2:25) also appears mildly hyperattenuating. No hydronephrosis or calculi. No focal bladder wall thickening. Stomach/Bowel: Normal appearance of the stomach. Colonic intussusception involving the mid transverse colon with intussusceptum extending to the level of the distal descending colon, where there is lobulated enhancing tissue (2:53). Mild gas-filled dilation of the upstream proximal transverse colon. Normal appendix. Vascular/Lymphatic: Aortic atherosclerosis. No enlarged abdominal or pelvic lymph nodes.  Reproductive: No adnexal masses. Other: Small volume pelvic free fluid. No free air or fluid collection. Musculoskeletal: No acute or abnormal lytic or blastic osseous lesions. Multilevel degenerative changes of the partially imaged thoracic and lumbar spine. Grade 1 anterolisthesis at L4-5. Small fat-containing right inguinal hernia. IMPRESSION: 1. Colonic intussusception involving the mid transverse colon with intussusceptum extending to the level of the distal descending colon, where there is lobulated enhancing tissue, which may represent neoplasm acting as a lead point. 2. Resulting obstruction with mild gas-filled dilation of the upstream proximal transverse colon. 3. Bilateral renal cysts, including an upper pole left renal cyst measuring 1.4 cm which demonstrates intermittent density. A smaller, subcentimeter focus in the anterior left interpolar kidney also appears mildly hyperattenuating. These may represent hemorrhagic or proteinaceous cysts, but are incompletely characterized. Recommend further evaluation with nonemergent renal protocol MRI or CT abdomen. 4.  Aortic Atherosclerosis (ICD10-I70.0). Electronically Signed   By: Limin  Xu M.D.   On: 03/15/2024 12:01    ASSESSMENT & PLAN  1.  Preoperative cardiac evaluation in a 89 year old woman with history of HFpEF, primary hypertension, and mixed hyperlipidemia.  No definitive ischemic heart disease although no recent evaluation (negative Myoview in 2007).  She reports no angina with baseline low-level activity.  She is being considered for potential partial colectomy with colostomy or ileostomy under general anesthesia.  RCRI perioperative cardiac risk index indicates at least intermediate risk not considering advanced age, 10.1% chance of major adverse cardiac event.  2.  Primary hypertension, currently off her baseline regimen including Altace  5 mg daily and Lasix  40 mg daily.  Systolics running 160s to 180s.  3.  Mixed hyperlipidemia, on  Lipitor 80 mg daily at baseline.  4.  PACs noted by ECG.  Ectopy evident on examination.  Recommend follow-up echocardiogram, please place patient on cardiac monitor as well.  If she is able to take oral medications, would resume Altace  (otherwise could temporarily use enalaprilat IV).  Careful attention to fluid status as she is at risk for further fluid retention in the perioperative setting.  May need to receive intermittent IV Lasix  depending on clinical status.  Further ischemic testing not clearly indicated at this point, she should be able to proceed with planned surgery at intermediate to high risk, I discussed this with the patient and her daughter.  We will review her echocardiogram and continue to follow with you.  For questions or updates, please contact [facility] Please consult [email or web address] for contact info under   Signed, Teddie Favre, MD  03/16/2024 9:03 AM

## 2024-03-16 ENCOUNTER — Inpatient Hospital Stay (HOSPITAL_COMMUNITY)

## 2024-03-16 DIAGNOSIS — Z01810 Encounter for preprocedural cardiovascular examination: Secondary | ICD-10-CM | POA: Diagnosis not present

## 2024-03-16 DIAGNOSIS — I10 Essential (primary) hypertension: Secondary | ICD-10-CM | POA: Diagnosis not present

## 2024-03-16 DIAGNOSIS — I5030 Unspecified diastolic (congestive) heart failure: Secondary | ICD-10-CM | POA: Diagnosis not present

## 2024-03-16 DIAGNOSIS — K561 Intussusception: Secondary | ICD-10-CM | POA: Diagnosis not present

## 2024-03-16 LAB — BASIC METABOLIC PANEL WITH GFR
Anion gap: 6 (ref 5–15)
BUN: 15 mg/dL (ref 8–23)
CO2: 24 mmol/L (ref 22–32)
Calcium: 8.6 mg/dL — ABNORMAL LOW (ref 8.9–10.3)
Chloride: 109 mmol/L (ref 98–111)
Creatinine, Ser: 1.07 mg/dL — ABNORMAL HIGH (ref 0.44–1.00)
GFR, Estimated: 48 mL/min — ABNORMAL LOW (ref >60)
Glucose, Bld: 102 mg/dL — ABNORMAL HIGH (ref 70–99)
Potassium: 4.3 mmol/L (ref 3.5–5.1)
Sodium: 139 mmol/L (ref 135–145)

## 2024-03-16 LAB — CBC
HCT: 38.3 % (ref 36.0–46.0)
Hemoglobin: 11.8 g/dL — ABNORMAL LOW (ref 12.0–15.0)
MCH: 30.6 pg (ref 26.0–34.0)
MCHC: 30.8 g/dL (ref 30.0–36.0)
MCV: 99.2 fL (ref 80.0–100.0)
Platelets: 265 10*3/uL (ref 150–400)
RBC: 3.86 MIL/uL — ABNORMAL LOW (ref 3.87–5.11)
RDW: 13.9 % (ref 11.5–15.5)
WBC: 7.1 10*3/uL (ref 4.0–10.5)
nRBC: 0 % (ref 0.0–0.2)

## 2024-03-16 LAB — ECHOCARDIOGRAM COMPLETE
Area-P 1/2: 2.87 cm2
MV M vel: 3.45 m/s
MV Peak grad: 47.6 mmHg
S' Lateral: 2.7 cm

## 2024-03-16 MED ORDER — SODIUM CHLORIDE 0.9 % IV SOLN: INTRAVENOUS | Status: AC

## 2024-03-16 MED ORDER — RAMIPRIL 5 MG PO CAPS
5.0000 mg | ORAL_CAPSULE | Freq: Every day | ORAL | Status: DC
  Administered 2024-03-16 – 2024-03-19 (×3): 5 mg via ORAL
  Filled 2024-03-16 (×5): qty 1

## 2024-03-16 NOTE — Progress Notes (Signed)
 Patient transferred to room 1508.

## 2024-03-16 NOTE — Progress Notes (Signed)
 [facility] ROUNDING NOTE JA PISTOLE WJX:[id number]  DOB: [birth year]  DOA: 03/15/2024  PCP: Clem Currier, DO  03/16/2024,10:09 AM  LOS: 1 day    Code Status: Full code   from: Home current Dispo: Likely home   89 year old home dwelling female HFpEF echo 2023 grade 1 DD HTN HLD Lower extremity edema with venous reflux follows with vascular Lower extremity neuropathy, BPPV  Developed abdominal pain nausea discomfort 6/5 sounds like she developed colonic intussusception CT showed colonic intussusception mid transverse colon lobulated enhancing tissue?  Neoplasm lead point, bilateral renal cysts including left upper pole cyst 1.4 intermittent density unclearly characterized BUN/creatinine 20/1.0 WBC 8 hemoglobin 12 platelet 284 UA negative General surgery consulted Cardiology consulted for clearance   Plan  Colonic intussusception N.p.o., IVF saline 100 cc/H, oxycodone  first choice 5 mg every 4 moderate pain, morphine  2 mg every 4 as needed severe Defer to general surgery plan-RCRI perioperative risk is 2, 10% chance of major adverse reaction Follow echocardiogram and resume  ramipril  5 Would hold aspirin and Imdur  for now Further planning For eventual surgery (partial colectomy with colostomy) as per general surgery  DVT prophylaxis:  Lovenox  for now  Status is: Inpatient Remains inpatient appropriate because:   Need eventual surgery  Subjective: Looks well pain however 6/10 No flatus no stool No vomit  Objective + exam Vitals:   03/15/24 2253 03/15/24 2308 03/16/24 0257 03/16/24 0600  BP: (!) 183/78 (!) 160/102 (!) 160/77 (!) 180/69  Pulse: 79 78 81 72  Resp: 17 17 17 17   Temp: 98.5 F (36.9 C) 98.5 F (36.9 C) 98.5 F (36.9 C) 98.7 F (37.1 C)  TempSrc: Oral Oral Oral Oral  SpO2: 94% 97% 94% 96%   There were no vitals filed for this visit.  Examination: EOMI NCAT no focal deficit no icterus no pallor CTAB no added sound wheeze rales rhonchi ROM intact Abdominal  slightly distended no rebound No lower extremity edema  Data Reviewed: reviewed   CBC    Component Value Date/Time   WBC 7.1 03/16/2024 0454   RBC 3.86 (L) 03/16/2024 0454   HGB 11.8 (L) 03/16/2024 0454   HGB 12.1 01/23/2023 1507   HCT 38.3 03/16/2024 0454   HCT 37.4 01/23/2023 1507   PLT 265 03/16/2024 0454   PLT 260 01/23/2023 1507   MCV 99.2 03/16/2024 0454   MCV 92 01/23/2023 1507   MCH 30.6 03/16/2024 0454   MCHC 30.8 03/16/2024 0454   RDW 13.9 03/16/2024 0454   RDW 13.0 01/23/2023 1507   LYMPHSABS 2.0 08/22/2021 0110   LYMPHSABS 2.0 03/15/2021 1438   MONOABS 0.5 08/22/2021 0110   EOSABS 0.1 08/22/2021 0110   EOSABS 0.1 03/15/2021 1438   BASOSABS 0.0 08/22/2021 0110   BASOSABS 0.1 03/15/2021 1438      Latest Ref Rng & Units 03/16/2024    4:54 AM 03/15/2024    9:57 AM 01/23/2023    3:07 PM  CMP  Glucose 70 - 99 mg/dL 213  086  97   BUN 8 - 23 mg/dL 15  20  17    Creatinine 0.44 - 1.00 mg/dL 5.78  4.69  6.29   Sodium 135 - 145 mmol/L 139  139  142   Potassium 3.5 - 5.1 mmol/L 4.3  3.8  4.1   Chloride 98 - 111 mmol/L 109  106  104   CO2 22 - 32 mmol/L 24  24  25    Calcium  8.9 - 10.3 mg/dL 8.6  8.9  9.1  Total Protein 6.5 - 8.1 g/dL  6.7    Total Bilirubin 0.0 - 1.2 mg/dL  0.9    Alkaline Phos 38 - 126 U/L  58    AST 15 - 41 U/L  17    ALT 0 - 44 U/L  11      Scheduled Meds:  enoxaparin  (LOVENOX ) injection  40 mg Subcutaneous Q24H   Continuous Infusions:  Time  33  Jai-Gurmukh Carolie Mcilrath, MD  [facility]

## 2024-03-16 NOTE — Progress Notes (Signed)
 Mobility Specialist - Progress Note   03/16/24 1003  Mobility  Activity Ambulated with assistance in hallway  Level of Assistance Modified independent, requires aide device or extra time  Assistive Device Other (Comment) (IV Pole)  Distance Ambulated (ft) 500 ft  Activity Response Tolerated well  Mobility Referral Yes  Mobility visit 1 Mobility  Mobility Specialist Start Time (ACUTE ONLY) [id number]  Mobility Specialist Stop Time (ACUTE ONLY) 1000  Mobility Specialist Time Calculation (min) (ACUTE ONLY) 9 min   Pt received in bed and agreeable to mobility. No complaints during session. Pt to EOB after session with all needs met.    [facility]

## 2024-03-16 NOTE — Progress Notes (Signed)
 Subjective/Chief Complaint: Complains of mild abd pain that comes and goes   Objective: Vital signs in last 24 hours: Temp:  [97.7 F (36.5 C)-98.7 F (37.1 C)] 98.7 F (37.1 C) (06/07 0600) Pulse Rate:  [72-81] 72 (06/07 0600) Resp:  [17] 17 (06/07 0600) BP: (160-183)/(66-102) 180/69 (06/07 0600) SpO2:  [94 %-99 %] 96 % (06/07 0600)    Intake/Output from previous day: 06/06 0701 - 06/07 0700 In: 785.9 [I.V.:785.9] Out: 0  Intake/Output this shift: No intake/output data recorded.  General appearance: alert and cooperative Resp: clear to auscultation bilaterally Cardio: regular rate and rhythm GI: soft, moderate left sided tenderness  Lab Results:  Recent Labs    03/15/24 0957 03/16/24 0454  WBC 8.2 7.1  HGB 12.0 11.8*  HCT 39.2 38.3  PLT 284 265   BMET Recent Labs    03/15/24 0957 03/16/24 0454  NA 139 139  K 3.8 4.3  CL 106 109  CO2 24 24  GLUCOSE 113* 102*  BUN 20 15  CREATININE 1.03* 1.07*  CALCIUM  8.9 8.6*   PT/INR No results for input(s): "LABPROT", "INR" in the last 72 hours. ABG No results for input(s): "PHART", "HCO3" in the last 72 hours.  Invalid input(s): "PCO2", "PO2"  Studies/Results: CT ABDOMEN PELVIS W CONTRAST Result Date: 03/15/2024 CLINICAL DATA:  Bowel obstruction suspected EXAM: CT ABDOMEN AND PELVIS WITH CONTRAST TECHNIQUE: Multidetector CT imaging of the abdomen and pelvis was performed using the standard protocol following bolus administration of intravenous contrast. RADIATION DOSE REDUCTION: This exam was performed according to the departmental dose-optimization program which includes automated exposure control, adjustment of the mA and/or kV according to patient size and/or use of iterative reconstruction technique. CONTRAST:  OMNIPAQUE  IOHEXOL  300 MG/ML  SOLN COMPARISON:  None Available. FINDINGS: Lower chest: No focal consolidation or pulmonary nodule in the lung bases. No pleural effusion or pneumothorax  demonstrated. Partially imaged heart size is normal. Hepatobiliary: No focal hepatic lesions. No intra or extrahepatic biliary ductal dilation. Normal gallbladder. Pancreas: No focal lesions or main ductal dilation. Spleen: Normal in size without focal abnormality. Adrenals/Urinary Tract: No adrenal nodules. Bilateral renal cysts, including an upper pole left renal cyst measuring 1.4 cm (2:20) which demonstrates intermittent density. A smaller, subcentimeter focus in the anterior left interpolar kidney (2:25) also appears mildly hyperattenuating. No hydronephrosis or calculi. No focal bladder wall thickening. Stomach/Bowel: Normal appearance of the stomach. Colonic intussusception involving the mid transverse colon with intussusceptum extending to the level of the distal descending colon, where there is lobulated enhancing tissue (2:53). Mild gas-filled dilation of the upstream proximal transverse colon. Normal appendix. Vascular/Lymphatic: Aortic atherosclerosis. No enlarged abdominal or pelvic lymph nodes. Reproductive: No adnexal masses. Other: Small volume pelvic free fluid. No free air or fluid collection. Musculoskeletal: No acute or abnormal lytic or blastic osseous lesions. Multilevel degenerative changes of the partially imaged thoracic and lumbar spine. Grade 1 anterolisthesis at L4-5. Small fat-containing right inguinal hernia. IMPRESSION: 1. Colonic intussusception involving the mid transverse colon with intussusceptum extending to the level of the distal descending colon, where there is lobulated enhancing tissue, which may represent neoplasm acting as a lead point. 2. Resulting obstruction with mild gas-filled dilation of the upstream proximal transverse colon. 3. Bilateral renal cysts, including an upper pole left renal cyst measuring 1.4 cm which demonstrates intermittent density. A smaller, subcentimeter focus in the anterior left interpolar kidney also appears mildly hyperattenuating. These may  represent hemorrhagic or proteinaceous cysts, but are incompletely characterized. Recommend further  evaluation with nonemergent renal protocol MRI or CT abdomen. 4.  Aortic Atherosclerosis (ICD10-I70.0). Electronically Signed   By: Limin  Xu M.D.   On: 03/15/2024 12:01    Anti-infectives: Anti-infectives (From admission, onward)    None       Assessment/Plan: s/p * No surgery found * Continue bowel rest Large bowel obstruction. Will need partial colectomy and possible colostomy in near future Cardiology evaluating. Echo today and probable surgery in next couple days  LOS: 1 day    Cheryl Anderson 03/16/2024

## 2024-03-17 DIAGNOSIS — K561 Intussusception: Secondary | ICD-10-CM | POA: Diagnosis not present

## 2024-03-17 DIAGNOSIS — Z01810 Encounter for preprocedural cardiovascular examination: Secondary | ICD-10-CM | POA: Diagnosis not present

## 2024-03-17 LAB — CBC WITH DIFFERENTIAL/PLATELET
Abs Immature Granulocytes: 0.03 10*3/uL (ref 0.00–0.07)
Basophils Absolute: 0 10*3/uL (ref 0.0–0.1)
Basophils Relative: 1 %
Eosinophils Absolute: 0 10*3/uL (ref 0.0–0.5)
Eosinophils Relative: 0 %
HCT: 37.3 % (ref 36.0–46.0)
Hemoglobin: 11.6 g/dL — ABNORMAL LOW (ref 12.0–15.0)
Immature Granulocytes: 0 %
Lymphocytes Relative: 14 %
Lymphs Abs: 1.2 10*3/uL (ref 0.7–4.0)
MCH: 30.2 pg (ref 26.0–34.0)
MCHC: 31.1 g/dL (ref 30.0–36.0)
MCV: 97.1 fL (ref 80.0–100.0)
Monocytes Absolute: 0.7 10*3/uL (ref 0.1–1.0)
Monocytes Relative: 8 %
Neutro Abs: 6.7 10*3/uL (ref 1.7–7.7)
Neutrophils Relative %: 77 %
Platelets: 260 10*3/uL (ref 150–400)
RBC: 3.84 MIL/uL — ABNORMAL LOW (ref 3.87–5.11)
RDW: 13.7 % (ref 11.5–15.5)
WBC: 8.7 10*3/uL (ref 4.0–10.5)
nRBC: 0 % (ref 0.0–0.2)

## 2024-03-17 LAB — BASIC METABOLIC PANEL WITH GFR
Anion gap: 7 (ref 5–15)
BUN: 16 mg/dL (ref 8–23)
CO2: 23 mmol/L (ref 22–32)
Calcium: 8.5 mg/dL — ABNORMAL LOW (ref 8.9–10.3)
Chloride: 111 mmol/L (ref 98–111)
Creatinine, Ser: 1.1 mg/dL — ABNORMAL HIGH (ref 0.44–1.00)
GFR, Estimated: 47 mL/min — ABNORMAL LOW (ref >60)
Glucose, Bld: 94 mg/dL (ref 70–99)
Potassium: 3.9 mmol/L (ref 3.5–5.1)
Sodium: 141 mmol/L (ref 135–145)

## 2024-03-17 MED ORDER — CHLORHEXIDINE GLUCONATE 4 % EX SOLN
60.0000 mL | Freq: Once | CUTANEOUS | Status: AC
  Administered 2024-03-18: 4 via TOPICAL
  Filled 2024-03-17: qty 60

## 2024-03-17 MED ORDER — METRONIDAZOLE 500 MG PO TABS
1000.0000 mg | ORAL_TABLET | ORAL | Status: AC
  Administered 2024-03-17 (×3): 1000 mg via ORAL
  Filled 2024-03-17 (×3): qty 2

## 2024-03-17 MED ORDER — NEOMYCIN SULFATE 500 MG PO TABS
1000.0000 mg | ORAL_TABLET | ORAL | Status: AC
  Administered 2024-03-17 (×3): 1000 mg via ORAL
  Filled 2024-03-17 (×3): qty 2

## 2024-03-17 MED ORDER — CHLORHEXIDINE GLUCONATE 4 % EX SOLN
60.0000 mL | Freq: Once | CUTANEOUS | Status: AC
Start: 2024-03-17 — End: 2024-03-17
  Administered 2024-03-17: 4 via TOPICAL
  Filled 2024-03-17: qty 60

## 2024-03-17 MED ORDER — SODIUM CHLORIDE 0.9 % IV SOLN
2.0000 g | INTRAVENOUS | Status: AC
  Administered 2024-03-18: 2 g via INTRAVENOUS
  Filled 2024-03-17: qty 2

## 2024-03-17 NOTE — Progress Notes (Signed)
 Patient ID: Cheryl Anderson, female   DOB: 1928/11/10, 89 y.o.   MRN: [id number]   Acute Care Surgery Service Progress Note:    Chief Complaint/Subjective: No n/v Min abd discomfort Walked in halls Family member at bs  Objective: Vital signs in last 24 hours: Temp:  [98.1 F (36.7 C)-98.6 F (37 C)] 98.6 F (37 C) (06/08 0528) Pulse Rate:  [43-95] 95 (06/08 0528) Resp:  [16-18] 18 (06/08 0528) BP: (166-179)/(63-77) 169/77 (06/08 0956) SpO2:  [89 %-96 %] 96 % (06/08 0528)    Intake/Output from previous day: 06/07 0701 - 06/08 0700 In: 500 [I.V.:500] Out: 100 [Urine:100] Intake/Output this shift: No intake/output data recorded.  Lungs: cta,   Cardiovascular: reg  Abd: soft, slightly full, no peritonitis  Extremities: no edema, +SCDs  Neuro: alert, nonfocal  Lab Results: CBC  Recent Labs    03/16/24 0454 03/17/24 0601  WBC 7.1 8.7  HGB 11.8* 11.6*  HCT 38.3 37.3  PLT 265 260   BMET Recent Labs    03/16/24 0454 03/17/24 0601  NA 139 141  K 4.3 3.9  CL 109 111  CO2 24 23  GLUCOSE 102* 94  BUN 15 16  CREATININE 1.07* 1.10*  CALCIUM  8.6* 8.5*   LFT    Latest Ref Rng & Units 03/15/2024    9:57 AM 04/22/2022   11:43 AM 03/15/2021    2:38 PM  Hepatic Function  Total Protein 6.5 - 8.1 g/dL 6.7  6.2  6.6   Albumin 3.5 - 5.0 g/dL 3.5  3.9  4.0   AST 15 - 41 U/L 17  11  15    ALT 0 - 44 U/L 11  10  16    Alk Phosphatase 38 - 126 U/L 58  63  70   Total Bilirubin 0.0 - 1.2 mg/dL 0.9  0.4  0.5    PT/INR No results for input(s): "LABPROT", "INR" in the last 72 hours. ABG No results for input(s): "PHART", "HCO3" in the last 72 hours.  Invalid input(s): "PCO2", "PO2"  Studies/Results:  Anti-infectives: Anti-infectives (From admission, onward)    None       Medications: Scheduled Meds:  enoxaparin  (LOVENOX ) injection  40 mg Subcutaneous Q24H   ramipril   5 mg Oral Daily   Continuous Infusions:  sodium chloride  50 mL/hr at 03/17/24 0544   PRN  Meds:.acetaminophen  **OR** acetaminophen , albuterol , morphine  injection, ondansetron  **OR** ondansetron  (ZOFRAN ) IV, oxyCODONE   Assessment/Plan: Patient Active Problem List   Diagnosis Date Noted   Colonic intussusception (HCC) 03/15/2024   Need for immunization against influenza 07/15/2022   Left hip pain 10/27/2021   Syncope 08/30/2021   Healthcare maintenance 03/15/2021   Arthritis of right shoulder region 09/01/2020   Back pain 05/25/2019   Constipation 05/14/2019   Seasonal allergies 04/10/2019   Nondisplaced fracture of fifth right metatarsal bone with routine healing 11/06/2018   Encounter for smoking cessation counseling 10/26/2018   CAD (coronary artery disease) 02/08/2018   (HFpEF) heart failure with preserved ejection fraction (HCC) 11/07/2017   Bilateral lower extremity edema 05/24/2017   Hearing loss 03/16/2016   Visual impairment 03/16/2016   Peripheral arterial disease (HCC) 03/13/2015   Osteoarthritis of right knee 02/01/2015   Vitamin D  deficiency 08/20/2013   Osteoporosis 07/25/2013   Positive PPD 02/10/2011   Edema 11/27/2009   Hyperlipidemia with target low density lipoprotein (LDL) cholesterol less than 160 mg/dL 32/95/1884   TOBACCO USE 12/27/2006   HYPERTENSION, BENIGN ESSENTIAL 12/27/2006   CHRONIC OBSTRUCTIVE PULMONARY DISEASE 12/27/2006  GASTROESOPHAGEAL REFLUX, NO ESOPHAGITIS 12/07/2006   Large bowel obstruction due to colonic intussusception at the transverse colon to the distal descending colon Likely intraluminal mass in the distal descending colon History of heart failure with preserved EF Hypertension Mixed lipidemia   No signs of worsening obstruction.  Patient is resting comfortably.  She is nontoxic-appearing.  No fever or tachycardia.  No leukocytosis.  Will plan partial colectomy with probable ostomy for Monday with Dr. Dorrie Gaudier We will have the WOC nurse mark the patient in the morning IV antibiotic on-call We will do the oral  antibiotic prep today but I do not think she will tolerate a colonic bowel prep Continue chemical DVT prophylaxis-does not need to be held for surgery for tomorrow  Appreciate cardiology assistance and evaluation  I discussed the procedure in detail.    We discussed the risks and benefits of surgery including, but not limited to bleeding, infection (such as wound infection, abdominal abscess), injury to surrounding structures, blood clot formation, urinary retention, incisional hernia, [anastomotic stricture, anastomotic leak if anastomosis performed], anesthesia risks, pulmonary & cardiac complications such as pneumonia &/or heart attack, need for additional procedures, ileus, & prolonged hospitalization.  We discussed risk associated with ostomy such as ischemia, prolapse, parastomal hernia, skin issues.  We discussed the typical postoperative recovery course, including limitations & restrictions postoperatively. I explained that the likelihood of improvement in their symptoms is good.  Disposition: Plan partial colectomy with probable ostomy tomorrow with Dr. Dorrie Gaudier  LOS: 2 days   I reviewed nursing notes,  provider notes, last 48 h intake and output, last 24 h labs and trends, and  CT imaging results.  Echo results, cardiology notes x 2  Cheryl Anderson. Elvan Hamel, MD, FACS General, Bariatric, & Minimally Invasive Surgery [phone] [facility]

## 2024-03-17 NOTE — Plan of Care (Signed)

## 2024-03-17 NOTE — Progress Notes (Signed)
 Mobility Specialist - Progress Note   03/17/24 1028  Mobility  Activity Ambulated with assistance in hallway  Level of Assistance Modified independent, requires aide device or extra time  Assistive Device Other (Comment) (IV Pole)  Distance Ambulated (ft) 500 ft  Activity Response Tolerated well  Mobility Referral Yes  Mobility visit 1 Mobility  Mobility Specialist Start Time (ACUTE ONLY) 1019  Mobility Specialist Stop Time (ACUTE ONLY) 1029  Mobility Specialist Time Calculation (min) (ACUTE ONLY) 10 min   Pt received in bed and agreeable to mobility. No complaints during session. Pt to bed after session with all needs met.   [facility]

## 2024-03-17 NOTE — Plan of Care (Signed)
   Problem: Education: Goal: Knowledge of General Education information will improve Description: Including pain rating scale, medication(s)/side effects and non-pharmacologic comfort measures Outcome: Progressing   Problem: Elimination: Goal: Will not experience complications related to bowel motility Outcome: Progressing   Problem: Pain Managment: Goal: General experience of comfort will improve and/or be controlled Outcome: Progressing   Problem: Safety: Goal: Ability to remain free from injury will improve Outcome: Progressing

## 2024-03-17 NOTE — Progress Notes (Signed)
 Progress Note  Patient Name: Cheryl Anderson Date of Encounter: 03/17/2024  Primary Cardiologist: Alexandria Angel, MD  Interval Summary  Chart reviewed.  Patient reports intermittent abdominal discomfort.  No palpitations or chest pain.  Follow-up echocardiogram is noted below.  She was started back on oral Altace .  Vital Signs  Vitals:   03/16/24 1402 03/16/24 2144 03/16/24 2254 03/17/24 0528  BP: (!) 179/74 (!) 178/75 (!) 173/63 (!) 166/65  Pulse: 81 87 (!) 43 95  Resp: 17 16 18 18   Temp:  98.2 F (36.8 C) 98.1 F (36.7 C) 98.6 F (37 C)  TempSrc:  Oral    SpO2: 96% 96% (!) 89% 96%    Intake/Output Summary (Last 24 hours) at 03/17/2024 0745 Last data filed at 03/16/2024 1940 Gross per 24 hour  Intake 500 ml  Output 100 ml  Net 400 ml   There were no vitals filed for this visit.  Physical Exam  GEN: No acute distress.   Neck: No JVD. Cardiac: RRR with ectopy, no murmur, 1/6 systolic gallop.  Respiratory: Nonlabored. Clear to auscultation bilaterally. GI: Mildly tender, decreased bowel sounds.. MS: Mild ankle edema.  ECG/Telemetry  Telemetry reviewed showing sinus rhythm with PACs.  Labs  Chemistry Recent Labs  Lab 03/15/24 0957 03/16/24 0454 03/17/24 0601  NA 139 139 141  K 3.8 4.3 3.9  CL 106 109 111  CO2 24 24 23   GLUCOSE 113* 102* 94  BUN 20 15 16   CREATININE 1.03* 1.07* 1.10*  CALCIUM  8.9 8.6* 8.5*  PROT 6.7  --   --   ALBUMIN 3.5  --   --   AST 17  --   --   ALT 11  --   --   ALKPHOS 58  --   --   BILITOT 0.9  --   --   GFRNONAA 51* 48* 47*  ANIONGAP 9 6 7     Hematology Recent Labs  Lab 03/15/24 0957 03/16/24 0454 03/17/24 0601  WBC 8.2 7.1 8.7  RBC 3.98 3.86* 3.84*  HGB 12.0 11.8* 11.6*  HCT 39.2 38.3 37.3  MCV 98.5 99.2 97.1  MCH 30.2 30.6 30.2  MCHC 30.6 30.8 31.1  RDW 14.0 13.9 13.7  PLT 284 265 260   Cardiac Studies  Echocardiogram 03/16/2024:  1. Left ventricular ejection fraction, by estimation, is 55 to 60%. The   left ventricle has normal function. The left ventricle has no regional  wall motion abnormalities. Left ventricular diastolic parameters are  consistent with Grade I diastolic  dysfunction (impaired relaxation).   2. Right ventricular systolic function is normal. The right ventricular  size is normal.   3. The mitral valve is abnormal. Trivial mitral valve regurgitation. No  evidence of mitral stenosis.   4. The aortic valve is tricuspid. Aortic valve regurgitation is not  visualized. No aortic stenosis is present.   5. The inferior vena cava is normal in size with greater than 50%  respiratory variability, suggesting right atrial pressure of 3 mmHg.   Assessment & Plan  1.  Preoperative cardiac evaluation in a 89 year old woman with history of HFpEF, primary hypertension, and mixed hyperlipidemia.  No definitive ischemic heart disease although no recent evaluation (negative Myoview in 2007).  She reports no angina with baseline low-level activity.  She is being considered for potential partial colectomy with colostomy or ileostomy under general anesthesia.  RCRI perioperative cardiac risk index indicates at least intermediate risk not considering advanced age, 10.1% chance of major adverse  cardiac event.  Follow-up echocardiogram shows LVEF 55 to 60% and no major valvular abnormalities.   2.  Primary hypertension, started back on Altace  5 mg daily.   3.  Mixed hyperlipidemia, on Lipitor 80 mg daily as an outpatient.   4.  PACs noted by ECG.  Following on telemetry.  Patient should be able to proceed with planned surgery at intermediate to high risk, no further cardiac testing planned at this time.  Continue to follow rhythm on telemetry.  We will follow with you, at risk for fluid retention in the perioperative setting, can address with IV Lasix  as needed and transition back to oral regimen when able.  For questions or updates, please contact  [facility] Please consult  [email or web address] for contact info under   Signed, Teddie Favre, MD  03/17/2024, 7:45 AM

## 2024-03-18 ENCOUNTER — Other Ambulatory Visit: Payer: Self-pay

## 2024-03-18 ENCOUNTER — Encounter (HOSPITAL_COMMUNITY): Payer: Self-pay | Admitting: Internal Medicine

## 2024-03-18 ENCOUNTER — Encounter (HOSPITAL_COMMUNITY)
Admission: EM | Disposition: A | Discharge status: Home Health | Payer: Self-pay | Source: Home / Self Care | Attending: Family Medicine

## 2024-03-18 ENCOUNTER — Inpatient Hospital Stay (HOSPITAL_COMMUNITY): Admitting: Anesthesiology

## 2024-03-18 DIAGNOSIS — I110 Hypertensive heart disease with heart failure: Secondary | ICD-10-CM

## 2024-03-18 DIAGNOSIS — K561 Intussusception: Secondary | ICD-10-CM

## 2024-03-18 DIAGNOSIS — I2510 Atherosclerotic heart disease of native coronary artery without angina pectoris: Secondary | ICD-10-CM

## 2024-03-18 DIAGNOSIS — I5030 Unspecified diastolic (congestive) heart failure: Secondary | ICD-10-CM | POA: Diagnosis not present

## 2024-03-18 HISTORY — PX: PARTIAL COLECTOMY: SHX5273

## 2024-03-18 LAB — CBC WITH DIFFERENTIAL/PLATELET
Abs Immature Granulocytes: 0.04 10*3/uL (ref 0.00–0.07)
Basophils Absolute: 0 10*3/uL (ref 0.0–0.1)
Basophils Relative: 1 %
Eosinophils Absolute: 0 10*3/uL (ref 0.0–0.5)
Eosinophils Relative: 0 %
HCT: 36.4 % (ref 36.0–46.0)
Hemoglobin: 11.4 g/dL — ABNORMAL LOW (ref 12.0–15.0)
Immature Granulocytes: 1 %
Lymphocytes Relative: 15 %
Lymphs Abs: 1.3 10*3/uL (ref 0.7–4.0)
MCH: 30.2 pg (ref 26.0–34.0)
MCHC: 31.3 g/dL (ref 30.0–36.0)
MCV: 96.6 fL (ref 80.0–100.0)
Monocytes Absolute: 0.9 10*3/uL (ref 0.1–1.0)
Monocytes Relative: 10 %
Neutro Abs: 6.5 10*3/uL (ref 1.7–7.7)
Neutrophils Relative %: 73 %
Platelets: 254 10*3/uL (ref 150–400)
RBC: 3.77 MIL/uL — ABNORMAL LOW (ref 3.87–5.11)
RDW: 13.7 % (ref 11.5–15.5)
WBC: 8.8 10*3/uL (ref 4.0–10.5)
nRBC: 0 % (ref 0.0–0.2)

## 2024-03-18 LAB — BASIC METABOLIC PANEL WITH GFR
Anion gap: 10 (ref 5–15)
BUN: 22 mg/dL (ref 8–23)
CO2: 21 mmol/L — ABNORMAL LOW (ref 22–32)
Calcium: 8.8 mg/dL — ABNORMAL LOW (ref 8.9–10.3)
Chloride: 108 mmol/L (ref 98–111)
Creatinine, Ser: 1.27 mg/dL — ABNORMAL HIGH (ref 0.44–1.00)
GFR, Estimated: 39 mL/min — ABNORMAL LOW (ref >60)
Glucose, Bld: 101 mg/dL — ABNORMAL HIGH (ref 70–99)
Potassium: 4 mmol/L (ref 3.5–5.1)
Sodium: 139 mmol/L (ref 135–145)

## 2024-03-18 LAB — TYPE AND SCREEN
ABO/RH(D): O POS
Antibody Screen: NEGATIVE

## 2024-03-18 LAB — ABO/RH: ABO/RH(D): O POS

## 2024-03-18 SURGERY — COLECTOMY, PARTIAL
Anesthesia: General

## 2024-03-18 MED ORDER — LABETALOL HCL 5 MG/ML IV SOLN
INTRAVENOUS | Status: DC | PRN
  Administered 2024-03-18 (×3): 5 mg via INTRAVENOUS

## 2024-03-18 MED ORDER — PHENYLEPHRINE HCL-NACL 20-0.9 MG/250ML-% IV SOLN
INTRAVENOUS | Status: DC | PRN
  Administered 2024-03-18: 40 ug/min via INTRAVENOUS

## 2024-03-18 MED ORDER — OXYCODONE HCL 5 MG PO TABS: 5.0000 mg | ORAL_TABLET | Freq: Once | ORAL | Status: DC | PRN

## 2024-03-18 MED ORDER — ONDANSETRON HCL 4 MG/2ML IJ SOLN
INTRAMUSCULAR | Status: AC
  Filled 2024-03-18: qty 2

## 2024-03-18 MED ORDER — PHENYLEPHRINE HCL (PRESSORS) 10 MG/ML IV SOLN
INTRAVENOUS | Status: AC
Start: 2024-03-18 — End: ?
  Filled 2024-03-18: qty 1

## 2024-03-18 MED ORDER — ROCURONIUM BROMIDE 100 MG/10ML IV SOLN
INTRAVENOUS | Status: DC | PRN
  Administered 2024-03-18: 10 mg via INTRAVENOUS
  Administered 2024-03-18: 50 mg via INTRAVENOUS

## 2024-03-18 MED ORDER — 0.9 % SODIUM CHLORIDE (POUR BTL) OPTIME
TOPICAL | Status: DC | PRN
  Administered 2024-03-18: 1000 mL

## 2024-03-18 MED ORDER — CHLORHEXIDINE GLUCONATE 0.12 % MT SOLN
15.0000 mL | Freq: Once | OROMUCOSAL | Status: AC
  Administered 2024-03-18: 15 mL via OROMUCOSAL

## 2024-03-18 MED ORDER — KETAMINE HCL 50 MG/5ML IJ SOSY
PREFILLED_SYRINGE | INTRAMUSCULAR | Status: AC
Start: 2024-03-18 — End: ?
  Filled 2024-03-18: qty 5

## 2024-03-18 MED ORDER — MEPERIDINE HCL 50 MG/ML IJ SOLN: 6.2500 mg | INTRAMUSCULAR | Status: DC | PRN

## 2024-03-18 MED ORDER — PHENYLEPHRINE 80 MCG/ML (10ML) SYRINGE FOR IV PUSH (FOR BLOOD PRESSURE SUPPORT)
PREFILLED_SYRINGE | INTRAVENOUS | Status: AC
  Filled 2024-03-18: qty 10

## 2024-03-18 MED ORDER — LABETALOL HCL 5 MG/ML IV SOLN
5.0000 mg | INTRAVENOUS | Status: DC | PRN
  Administered 2024-03-18: 5 mg via INTRAVENOUS

## 2024-03-18 MED ORDER — SUGAMMADEX SODIUM 200 MG/2ML IV SOLN
INTRAVENOUS | Status: DC | PRN
Start: 2024-03-18 — End: 2024-03-18
  Administered 2024-03-18: 200 mg via INTRAVENOUS

## 2024-03-18 MED ORDER — FENTANYL CITRATE (PF) 100 MCG/2ML IJ SOLN
INTRAMUSCULAR | Status: AC
  Filled 2024-03-18: qty 2

## 2024-03-18 MED ORDER — DEXAMETHASONE SODIUM PHOSPHATE 10 MG/ML IJ SOLN
INTRAMUSCULAR | Status: AC
  Filled 2024-03-18: qty 1

## 2024-03-18 MED ORDER — ONDANSETRON HCL 4 MG/2ML IJ SOLN: 4.0000 mg | Freq: Once | INTRAMUSCULAR | Status: DC | PRN

## 2024-03-18 MED ORDER — LABETALOL HCL 5 MG/ML IV SOLN
INTRAVENOUS | Status: AC
  Filled 2024-03-18: qty 4

## 2024-03-18 MED ORDER — OXYCODONE HCL 5 MG/5ML PO SOLN: 5.0000 mg | Freq: Once | ORAL | Status: DC | PRN

## 2024-03-18 MED ORDER — LIDOCAINE HCL (PF) 2 % IJ SOLN
INTRAMUSCULAR | Status: AC
  Filled 2024-03-18: qty 5

## 2024-03-18 MED ORDER — PHENYLEPHRINE 80 MCG/ML (10ML) SYRINGE FOR IV PUSH (FOR BLOOD PRESSURE SUPPORT)
PREFILLED_SYRINGE | INTRAVENOUS | Status: DC | PRN
  Administered 2024-03-18: 100 ug via INTRAVENOUS
  Administered 2024-03-18: 80 ug via INTRAVENOUS

## 2024-03-18 MED ORDER — PROPOFOL 10 MG/ML IV BOLUS
INTRAVENOUS | Status: DC | PRN
  Administered 2024-03-18 (×2): 50 mg via INTRAVENOUS
  Administered 2024-03-18: 100 mg via INTRAVENOUS

## 2024-03-18 MED ORDER — ROCURONIUM BROMIDE 10 MG/ML (PF) SYRINGE
PREFILLED_SYRINGE | INTRAVENOUS | Status: AC
  Filled 2024-03-18: qty 10

## 2024-03-18 MED ORDER — DEXMEDETOMIDINE HCL IN NACL 80 MCG/20ML IV SOLN
INTRAVENOUS | Status: DC | PRN
  Administered 2024-03-18 (×2): 4 ug via INTRAVENOUS

## 2024-03-18 MED ORDER — LACTATED RINGERS IV SOLN
INTRAVENOUS | Status: DC | PRN
Start: 2024-03-18 — End: 2024-03-18

## 2024-03-18 MED ORDER — PHENYLEPHRINE HCL (PRESSORS) 10 MG/ML IV SOLN
INTRAVENOUS | Status: AC
  Filled 2024-03-18: qty 1

## 2024-03-18 MED ORDER — SODIUM CHLORIDE 0.9 % IV SOLN: INTRAVENOUS | Status: DC

## 2024-03-18 MED ORDER — FENTANYL CITRATE PF 50 MCG/ML IJ SOSY
25.0000 ug | PREFILLED_SYRINGE | INTRAMUSCULAR | Status: DC | PRN
  Administered 2024-03-18: 25 ug via INTRAVENOUS

## 2024-03-18 MED ORDER — FENTANYL CITRATE PF 50 MCG/ML IJ SOSY
PREFILLED_SYRINGE | INTRAMUSCULAR | Status: AC
  Filled 2024-03-18: qty 1

## 2024-03-18 MED ORDER — LACTATED RINGERS IV SOLN: INTRAVENOUS | Status: DC

## 2024-03-18 MED ORDER — FENTANYL CITRATE (PF) 100 MCG/2ML IJ SOLN
INTRAMUSCULAR | Status: DC | PRN
  Administered 2024-03-18 (×2): 50 ug via INTRAVENOUS

## 2024-03-18 MED ORDER — PROPOFOL 10 MG/ML IV BOLUS
INTRAVENOUS | Status: AC
  Filled 2024-03-18: qty 20

## 2024-03-18 MED ORDER — ONDANSETRON HCL 4 MG/2ML IJ SOLN
INTRAMUSCULAR | Status: DC | PRN
Start: 2024-03-18 — End: 2024-03-18
  Administered 2024-03-18: 4 mg via INTRAVENOUS

## 2024-03-18 MED ORDER — DEXAMETHASONE SODIUM PHOSPHATE 10 MG/ML IJ SOLN
INTRAMUSCULAR | Status: DC | PRN
Start: 2024-03-18 — End: 2024-03-18
  Administered 2024-03-18: 8 mg via INTRAVENOUS

## 2024-03-18 MED ORDER — PROPOFOL 1000 MG/100ML IV EMUL
INTRAVENOUS | Status: AC
  Filled 2024-03-18: qty 100

## 2024-03-18 SURGICAL SUPPLY — 34 items
BAG COUNTER SPONGE SURGICOUNT (BAG) IMPLANT
DRSG OPSITE POSTOP 4X10 (GAUZE/BANDAGES/DRESSINGS) IMPLANT
DRSG OPSITE POSTOP 4X6 (GAUZE/BANDAGES/DRESSINGS) IMPLANT
DRSG OPSITE POSTOP 4X8 (GAUZE/BANDAGES/DRESSINGS) IMPLANT
ELECT REM PT RETURN 15FT ADLT (MISCELLANEOUS) ×1 IMPLANT
GAUZE SPONGE 4X4 12PLY STRL (GAUZE/BANDAGES/DRESSINGS) ×1 IMPLANT
GLOVE BIOGEL PI IND STRL 7.0 (GLOVE) ×2 IMPLANT
GLOVE SURG SS PI 7.0 STRL IVOR (GLOVE) ×2 IMPLANT
GOWN STRL REUS W/ TWL LRG LVL3 (GOWN DISPOSABLE) ×2 IMPLANT
GOWN STRL REUS W/ TWL XL LVL3 (GOWN DISPOSABLE) IMPLANT
HOLDER FOLEY CATH W/STRAP (MISCELLANEOUS) IMPLANT
KIT TURNOVER KIT A (KITS) IMPLANT
MANIFOLD NEPTUNE II (INSTRUMENTS) ×1 IMPLANT
PACK COLON (CUSTOM PROCEDURE TRAY) ×1 IMPLANT
PENCIL SMOKE EVACUATOR (MISCELLANEOUS) IMPLANT
RELOAD PROXIMATE 75MM BLUE (ENDOMECHANICALS) ×2 IMPLANT
RELOAD STAPLE 75 3.8 BLU REG (ENDOMECHANICALS) IMPLANT
STAPLER GUN LINEAR PROX 60 (STAPLE) IMPLANT
STAPLER PROXIMATE 75MM BLUE (STAPLE) IMPLANT
STAPLER SKIN PROX 35W (STAPLE) IMPLANT
SUT NOVA NAB DX-16 0-1 5-0 T12 (SUTURE) IMPLANT
SUT NOVA NAB GS-21 0 18 T12 DT (SUTURE) ×2 IMPLANT
SUT PDS AB 0 CT1 36 (SUTURE) IMPLANT
SUT PROLENE 2 0 BLUE (SUTURE) IMPLANT
SUT SILK 2 0 SH CR/8 (SUTURE) ×1 IMPLANT
SUT SILK 2-0 18XBRD TIE 12 (SUTURE) ×1 IMPLANT
SUT SILK 3 0 SH CR/8 (SUTURE) ×1 IMPLANT
SUT SILK 3-0 18XBRD TIE 12 (SUTURE) ×1 IMPLANT
SUT VIC AB 2-0 SH 18 (SUTURE) IMPLANT
SUT VIC AB 2-0 SH 27X BRD (SUTURE) ×2 IMPLANT
SUT VIC AB 3-0 SH 18 (SUTURE) IMPLANT
SUT VIC AB 4-0 PS2 18 (SUTURE) ×1 IMPLANT
TOWEL OR 17X26 10 PK STRL BLUE (TOWEL DISPOSABLE) IMPLANT
TUBING CONNECTING 10 (TUBING) ×2 IMPLANT

## 2024-03-18 NOTE — Anesthesia Preprocedure Evaluation (Addendum)
 Anesthesia Evaluation  Patient identified by MRN, date of birth, ID band Patient awake    Reviewed: Allergy & Precautions, H&P , NPO status , Patient's Chart, lab work & pertinent test results  Airway Mallampati: I  TM Distance: >3 FB Neck ROM: Full    Dental no notable dental hx. (+) Edentulous Upper, Poor Dentition, Dental Advisory Given, Missing,    Pulmonary COPD, Current Smoker, former smoker   Pulmonary exam normal breath sounds clear to auscultation       Cardiovascular Exercise Tolerance: Good hypertension, Pt. on medications + CAD and + Peripheral Vascular Disease  Normal cardiovascular exam Rhythm:Regular Rate:Normal  ECHO 6/25 1. Left ventricular ejection fraction, by estimation, is 55 to 60%. The  left ventricle has normal function. The left ventricle has no regional  wall motion abnormalities. Left ventricular diastolic parameters are  consistent with Grade I diastolic  dysfunction (impaired relaxation).   2. Right ventricular systolic function is normal. The right ventricular  size is normal.   3. The mitral valve is abnormal. Trivial mitral valve regurgitation. No  evidence of mitral stenosis.   4. The aortic valve is tricuspid. Aortic valve regurgitation is not  visualized. No aortic stenosis is present.   5. The inferior vena cava is normal in size with greater than 50%  respiratory variability, suggesting right atrial pressure of 3 mmHg.      Neuro/Psych negative neurological ROS  negative psych ROS   GI/Hepatic negative GI ROS, Neg liver ROS,GERD  ,,  Endo/Other  negative endocrine ROS    Renal/GU negative Renal ROS  negative genitourinary   Musculoskeletal negative musculoskeletal ROS (+) Arthritis ,    Abdominal   Peds negative pediatric ROS (+)  Hematology negative hematology ROS (+)   Anesthesia Other Findings   Reproductive/Obstetrics negative OB ROS                              Anesthesia Physical Anesthesia Plan  ASA: 4 and emergent  Anesthesia Plan: General   Post-op Pain Management: Ofirmev  IV (intra-op)*   Induction: Intravenous  PONV Risk Score and Plan: 3 and Ondansetron , Treatment may vary due to age or medical condition and TIVA  Airway Management Planned: Oral ETT  Additional Equipment: None  Intra-op Plan:   Post-operative Plan: Extubation in OR  Informed Consent: I have reviewed the patients History and Physical, chart, labs and discussed the procedure including the risks, benefits and alternatives for the proposed anesthesia with the patient or authorized representative who has indicated his/her understanding and acceptance.       Plan Discussed with: Anesthesiologist and CRNA  Anesthesia Plan Comments: (  )        Anesthesia Quick Evaluation

## 2024-03-18 NOTE — Progress Notes (Signed)
 TRH ROUNDING NOTE Cheryl Anderson NGE:[id number]  DOB: [birth year]  DOA: 03/15/2024  PCP: Clem Currier, DO  03/18/2024,3:20 PM  LOS: 3 days    Code Status: Full code   from: Home current Dispo: Likely home   89 year old home dwelling female HFpEF echo 2023 grade 1 DD HTN HLD Lower extremity edema with venous reflux follows with vascular Lower extremity neuropathy, BPPV  Developed abdominal pain nausea discomfort 6/5 sounds like she developed colonic intussusception CT showed colonic intussusception mid transverse colon lobulated enhancing tissue?  Neoplasm lead point, bilateral renal cysts including left upper pole cyst 1.4 intermittent density unclearly characterized BUN/creatinine 20/1.0 WBC 8 hemoglobin 12 platelet 284 UA negative General surgery consulted Cardiology consulted for clearance   Plan  Colonic intussusception N.p.o., IVF saline 100 cc/H, oxycodone  first choice 5 mg every 4 moderate pain, morphine  2 mg every 4 as needed severe Defer to general surgery plan-RCRI perioperative risk is 2, 10% chance of major adverse reaction Echo seems benign continuing ramipril  5 mg-cardiology has cleared patient for surgery Further planning For eventual surgery (partial colectomy with colostomy) as per general surgery  DVT prophylaxis:  Lovenox  for now  Status is: Inpatient Remains inpatient appropriate because:   Need eventual surgery  Subjective:  Some pain no flatus no stool Family at bedside  Objective + exam Vitals:   03/18/24 1319 03/18/24 1330 03/18/24 1345 03/18/24 1439  BP:   (!) 166/67 (!) 167/70  Pulse: 70  73 (!) 52  Resp: 14 11 13    Temp:    (!) 97.5 F (36.4 C)  TempSrc:      SpO2: 99% 94% 98% 98%  Weight:      Height:       Filed Weights   03/18/24 0905  Weight: 61.2 kg    Examination: EOMI NCAT no focal deficit no icterus no pallor CTAB no added sound wheeze rales rhonchi ROM intact Abdominal slightly distended no rebound No lower extremity  edema  Data Reviewed: reviewed   CBC    Component Value Date/Time   WBC 8.8 03/18/2024 0528   RBC 3.77 (L) 03/18/2024 0528   HGB 11.4 (L) 03/18/2024 0528   HGB 12.1 01/23/2023 1507   HCT 36.4 03/18/2024 0528   HCT 37.4 01/23/2023 1507   PLT 254 03/18/2024 0528   PLT 260 01/23/2023 1507   MCV 96.6 03/18/2024 0528   MCV 92 01/23/2023 1507   MCH 30.2 03/18/2024 0528   MCHC 31.3 03/18/2024 0528   RDW 13.7 03/18/2024 0528   RDW 13.0 01/23/2023 1507   LYMPHSABS 1.3 03/18/2024 0528   LYMPHSABS 2.0 03/15/2021 1438   MONOABS 0.9 03/18/2024 0528   EOSABS 0.0 03/18/2024 0528   EOSABS 0.1 03/15/2021 1438   BASOSABS 0.0 03/18/2024 0528   BASOSABS 0.1 03/15/2021 1438      Latest Ref Rng & Units 03/18/2024    5:28 AM 03/17/2024    6:01 AM 03/16/2024    4:54 AM  CMP  Glucose 70 - 99 mg/dL 401  94  027   BUN 8 - 23 mg/dL 22  16  15    Creatinine 0.44 - 1.00 mg/dL 2.53  6.64  4.03   Sodium 135 - 145 mmol/L 139  141  139   Potassium 3.5 - 5.1 mmol/L 4.0  3.9  4.3   Chloride 98 - 111 mmol/L 108  111  109   CO2 22 - 32 mmol/L 21  23  24    Calcium  8.9 - 10.3 mg/dL 8.8  8.5  8.6     Scheduled Meds:  enoxaparin  (LOVENOX ) injection  40 mg Subcutaneous Q24H   ramipril   5 mg Oral Daily   Continuous Infusions:  sodium chloride  40 mL/hr at 03/18/24 1610    Time  33  Jai-Gurmukh Daijah Scrivens, MD  [facility]

## 2024-03-18 NOTE — Progress Notes (Signed)
   03/18/24 0936  TOC Brief Assessment  Insurance and Status Reviewed  Patient has primary care physician Yes  Home environment has been reviewed Single family home  Prior level of function: Modified independent  Prior/Current Home Services No current home services  Social Drivers of Health Review SDOH reviewed no interventions necessary  Readmission risk has been reviewed Yes  Transition of care needs no transition of care needs at this time

## 2024-03-18 NOTE — Op Note (Signed)
 Preoperative diagnosis: intussusception of colon  Postoperative diagnosis: same   Procedure: transverse colon resection with end colostomy  Surgeon: Harman Lightning, M.D.  Asst: none  Anesthesia: GETA  Indications for procedure: Cheryl Anderson is a 89 y.o. year old female with symptoms of abdominal pain. Work up confirmed intussusception of the colon. After consent she was brought to OR for colon resection.  Description of procedure: The patient was brought into the operative suite. Anesthesia was administered with General endotracheal anesthesia. WHO checklist was applied. The patient was then placed in supine position. The area was prepped and draped in the usual sterile fashion.  Next, a midline incision was made. Cautery was used to dissect through the subcutaneous tissues and fascia. The fascia was safely entered. Upon initial inspection, the right colon was very dilated, no masses or implants were palpated over abdominal wall, pelvis, omentum, or liver. The distal transverse colon was seen to have an intussusception and was mobilized. Attachments from the lateral wall and omentum were divided with cautery. During manipulation the intussusception reduced fully. There was a palpable mass in the transverse colon. 2 75 mm GIA staplers were used to divide the colon in the distal transverse colon and the mid transverse colon. 2-0 prolene was sutured to the distal stump in 2 places. Ligasure was used to divide the vessels and a portion of the left branch of the middle colic was left on the specimen.  A location on the right upper abdominal wall was chosen for colostomy location. A circular incision was made in the skin and blunt dissection was used to identify the anterior rectus sheath. A cruciate incision was made through the anterior rectus sheath, the muscle fibers were bluntly dissected to identify the posterior rectus sheath and a cruciate incision was made in the posterior rectus sheath and  dilated to fit the colon. The colon was passed through the site and clamped extracorporeally.   The fascia was closed using a 0 PDS in running fashion. The skin was closed with staples. The wound was covered with a towel. The ostomy was matured with 3-0 vicryl with a 3 cm amount of Brooking in the cardinal direction.   Findings: transverse colon intussuscetion  Specimen: tranverse colon, suture marks distal  Implant: ostomy appliance, staples   Blood loss: 100 ml  Local anesthesia: none  Complications: none  Harman Lightning, M.D. General, Bariatric, & Minimally Invasive Surgery [facility]

## 2024-03-18 NOTE — Plan of Care (Signed)
  Problem: Clinical Measurements: Goal: Will remain free from infection Outcome: Progressing   Problem: Nutrition: Goal: Adequate nutrition will be maintained Outcome: Progressing   Problem: Pain Managment: Goal: General experience of comfort will improve and/or be controlled Outcome: Progressing

## 2024-03-18 NOTE — Plan of Care (Signed)

## 2024-03-18 NOTE — Anesthesia Procedure Notes (Signed)
 Procedure Name: Intubation Date/Time: 03/18/2024 11:04 AM  Performed by: Darlena Ego, CRNAPre-anesthesia Checklist: Patient identified, Emergency Drugs available, Suction available and Patient being monitored Patient Re-evaluated:Patient Re-evaluated prior to induction Oxygen Delivery Method: Circle System Utilized Preoxygenation: Pre-oxygenation with 100% oxygen Induction Type: IV induction Ventilation: Mask ventilation without difficulty Laryngoscope Size: Mac and 3 Grade View: Grade I Tube type: Oral Tube size: 7.0 mm Number of attempts: 1 Airway Equipment and Method: Stylet and Oral airway Placement Confirmation: ETT inserted through vocal cords under direct vision, positive ETCO2 and breath sounds checked- equal and bilateral Secured at: 21 cm Tube secured with: Tape Dental Injury: Teeth and Oropharynx as per pre-operative assessment

## 2024-03-18 NOTE — Progress Notes (Signed)
*   Day of Surgery *   Chief Complaint/Subjective: Abdominal discomfort  Objective: Vital signs in last 24 hours: Temp:  [98.1 F (36.7 C)-98.4 F (36.9 C)] 98.4 F (36.9 C) (06/09 0905) Pulse Rate:  [60-87] 60 (06/09 0555) Resp:  [18-20] 18 (06/09 0905) BP: (141-185)/(56-73) 185/73 (06/09 0905) SpO2:  [94 %-100 %] 95 % (06/09 0905) Weight:  [61.2 kg] 61.2 kg (06/09 0905) Last BM Date : 03/14/24 Intake/Output from previous day: No intake/output data recorded.  PE: Gen: NAD Resp: nonlabored Card: RRR Abd: soft, distended  Lab Results:  Recent Labs    03/17/24 0601 03/18/24 0528  WBC 8.7 8.8  HGB 11.6* 11.4*  HCT 37.3 36.4  PLT 260 254   Recent Labs    03/17/24 0601 03/18/24 0528  NA 141 139  K 3.9 4.0  CL 111 108  CO2 23 21*  GLUCOSE 94 101*  BUN 16 22  CREATININE 1.10* 1.27*  CALCIUM  8.5* 8.8*   No results for input(s): "LABPROT", "INR" in the last 72 hours.    Component Value Date/Time   NA 139 03/18/2024 0528   NA 142 01/23/2023 1507   K 4.0 03/18/2024 0528   CL 108 03/18/2024 0528   CO2 21 (L) 03/18/2024 0528   GLUCOSE 101 (H) 03/18/2024 0528   BUN 22 03/18/2024 0528   BUN 17 01/23/2023 1507   CREATININE 1.27 (H) 03/18/2024 0528   CREATININE 1.03 12/02/2014 1631   CALCIUM  8.8 (L) 03/18/2024 0528   PROT 6.7 03/15/2024 0957   PROT 6.2 04/22/2022 1143   ALBUMIN 3.5 03/15/2024 0957   ALBUMIN 3.9 04/22/2022 1143   AST 17 03/15/2024 0957   ALT 11 03/15/2024 0957   ALKPHOS 58 03/15/2024 0957   BILITOT 0.9 03/15/2024 0957   BILITOT 0.4 04/22/2022 1143   GFRNONAA 39 (L) 03/18/2024 0528   GFRAA 50 (L) 12/20/2019 1530    Assessment/Plan 89 yo female with intussusception involving the left colon. I discussed plan with the patient and family for open resection of left colon with planned colostomy. We discussed risks of bleeding, leak, infection, pneumonia, loss of independence, and death. We discussed goals of 1 week hospitalization with 1-3 month  recovery with prioritization of enteral nutrition and therapy to keep functional status. Melody of WOC spoke with me above habitus and placement and with patient about ostomy care. I discussed that the ostomy is reversible, but due to age may not be the best plan for her. -OR today for colon resection   LOS: 3 days   I reviewed last 24 h vitals and pain scores, last 48 h intake and output, last 24 h labs and trends, and last 24 h imaging results.  This care required high  level of medical decision making.   Alphonso Aschoff [facility] at [facility] 03/18/2024, 10:22 AM Please see Amion for pager number during day hours 7:00am-4:30pm or 7:00am -11:30am on weekends

## 2024-03-18 NOTE — Progress Notes (Addendum)
 TRH ROUNDING NOTE Cheryl Anderson ZOX:[id number]  DOB: 05-31-1929  DOA: 03/15/2024  PCP: Clem Currier, DO  03/18/2024,3:17 PM  LOS: 3 days    Code Status: Full code   from: Home current Dispo: Likely home   89 year old home dwelling female HFpEF echo 2023 grade 1 DD HTN HLD Lower extremity edema with venous reflux follows with vascular Lower extremity neuropathy, BPPV  Developed abdominal pain nausea discomfort 6/5 sounds like she developed colonic intussusception CT showed colonic intussusception mid transverse colon lobulated enhancing tissue?  Neoplasm lead point, bilateral renal cysts including left upper pole cyst 1.4 intermittent density unclearly characterized BUN/creatinine 20/1.0 WBC 8 hemoglobin 12 platelet 284 UA negative General surgery consulted Cardiology consulted for clearance   Plan  Colonic intussusception status post surgery with end colostomy 6/9 N.p.o., IVF saline 100 cc/H, oxycodone  first choice 5 mg every 4 moderate pain, morphine  2 mg every 4 as needed severe Continue management as per general surgery--diet as per general surgery, continue NS 40 cc/H  HFpEF Would hold aspirin and Imdur  for now--resume once taking PO Can continue Altace  5 daily   DVT prophylaxis:  Lovenox  for now  Status is: Inpatient Remains inpatient appropriate because:   Need eventual surgery  Subjective:  Seen at PACU looks well feels fair no distress was hypertensive perioperatively A little bit sleepy but awakens  Objective + exam Vitals:   03/18/24 1319 03/18/24 1330 03/18/24 1345 03/18/24 1439  BP:   (!) 166/67 (!) 167/70  Pulse: 70  73 (!) 52  Resp: 14 11 13    Temp:    (!) 97.5 F (36.4 C)  TempSrc:      SpO2: 99% 94% 98% 98%  Weight:      Height:       Filed Weights   03/18/24 0905  Weight: 61.2 kg    Examination:  EOMI NCAT on oxygen Chest clear S1-S2 sinus rhythm Ostomy in place bag filled with liquid stool No lower extremity edema Slight pain Honeycomb  dressing in place Lower extremities itself  Data Reviewed: reviewed   CBC    Component Value Date/Time   WBC 8.8 03/18/2024 0528   RBC 3.77 (L) 03/18/2024 0528   HGB 11.4 (L) 03/18/2024 0528   HGB 12.1 01/23/2023 1507   HCT 36.4 03/18/2024 0528   HCT 37.4 01/23/2023 1507   PLT 254 03/18/2024 0528   PLT 260 01/23/2023 1507   MCV 96.6 03/18/2024 0528   MCV 92 01/23/2023 1507   MCH 30.2 03/18/2024 0528   MCHC 31.3 03/18/2024 0528   RDW 13.7 03/18/2024 0528   RDW 13.0 01/23/2023 1507   LYMPHSABS 1.3 03/18/2024 0528   LYMPHSABS 2.0 03/15/2021 1438   MONOABS 0.9 03/18/2024 0528   EOSABS 0.0 03/18/2024 0528   EOSABS 0.1 03/15/2021 1438   BASOSABS 0.0 03/18/2024 0528   BASOSABS 0.1 03/15/2021 1438      Latest Ref Rng & Units 03/18/2024    5:28 AM 03/17/2024    6:01 AM 03/16/2024    4:54 AM  CMP  Glucose 70 - 99 mg/dL 811  94  914   BUN 8 - 23 mg/dL 22  16  15    Creatinine 0.44 - 1.00 mg/dL 7.82  9.56  2.13   Sodium 135 - 145 mmol/L 139  141  139   Potassium 3.5 - 5.1 mmol/L 4.0  3.9  4.3   Chloride 98 - 111 mmol/L 108  111  109   CO2 22 - 32 mmol/L 21  23  24   Calcium  8.9 - 10.3 mg/dL 8.8  8.5  8.6     Scheduled Meds:  enoxaparin  (LOVENOX ) injection  40 mg Subcutaneous Q24H   ramipril   5 mg Oral Daily   Continuous Infusions:  sodium chloride  40 mL/hr at 03/18/24 0807   lactated ringers 10 mL/hr at 03/18/24 0920    Time  33  Jai-Gurmukh Kyheem Bathgate, MD  [facility]

## 2024-03-18 NOTE — Transfer of Care (Signed)
 Immediate Anesthesia Transfer of Care Note  Patient: Derenda Flax  Procedure(s) Performed: PARTIAL COLECTOMY WITH COLOSTOMY  Patient Location: PACU  Anesthesia Type:General  Level of Consciousness: awake, alert , and oriented  Airway & Oxygen Therapy: Patient Spontanous Breathing and Patient connected to face mask oxygen  Post-op Assessment: Report given to RN and Post -op Vital signs reviewed and stable  Post vital signs: Reviewed and stable  Last Vitals:  Vitals Value Taken Time  BP 187/61 03/18/24 1253  Temp    Pulse 117 03/18/24 1255  Resp 23 03/18/24 1257  SpO2 98 % 03/18/24 1255  Vitals shown include unfiled device data.  Last Pain:  Vitals:   03/18/24 0905  TempSrc: Oral  PainSc: 0-No pain      Patients Stated Pain Goal: 5 (03/18/24 0905)  Complications: No notable events documented.

## 2024-03-18 NOTE — Anesthesia Postprocedure Evaluation (Signed)
 Anesthesia Post Note  Patient: Cheryl Anderson  Procedure(s) Performed: PARTIAL COLECTOMY WITH COLOSTOMY     Patient location during evaluation: PACU Anesthesia Type: General Level of consciousness: awake and alert Pain management: pain level controlled Vital Signs Assessment: post-procedure vital signs reviewed and stable Respiratory status: spontaneous breathing, nonlabored ventilation, respiratory function stable and patient connected to nasal cannula oxygen Cardiovascular status: blood pressure returned to baseline and stable Postop Assessment: no apparent nausea or vomiting Anesthetic complications: no   No notable events documented.  Last Vitals:  Vitals:   03/18/24 1253 03/18/24 1300  BP: (!) 187/61 (!) 200/75  Pulse: 70 70  Resp: 19 13  Temp: 36.4 C   SpO2: 98% 100%    Last Pain:  Vitals:   03/18/24 0905  TempSrc: Oral  PainSc: 0-No pain                 Nevin Grizzle

## 2024-03-18 NOTE — Consult Note (Signed)
 WOC Nurse requested for preoperative stoma site marking  Discussed surgical procedure and stoma creation with patient and family.  Explained role of the WOC nurse team.  Provided the patient with educational booklet and provided samples of pouching options.  Answered patient and family questions. 2 daughters in the room [name] and [name]. They will be involved in her post op care, [name] lives with [name].   Examined patient lying, sitting, and standing in order to place the marking in the patient's visual field, away from any creases or abdominal contour issues and within the rectus muscle.  Attempted to mark below the patient's belt line.   Marked for colostomy in the LLQ  _4___ cm to the left of the umbilicus and _6.5___cm above/below the umbilicus.  Marked for ileostomy in the RLQ  _3___cm to the right of the umbilicus and  _5___ cm above/below the umbilicus.  Provided daughters with WOC contact information and explained visit schedule based on patient's status, planned pouch change and teaching with daughters and patient on Wed. AM @ 930-10am  Patient has had CHG bath already this am. Covered mark with thin film transparent  dressing to preserve mark until date of surgery.   [facility] Nurse team will follow up with patient after surgery for continue ostomy care and teaching.  Cheryl Anderson [facility] MSN, RN,CWOCN, CNS, [employer] [phone]

## 2024-03-19 ENCOUNTER — Encounter (HOSPITAL_COMMUNITY): Payer: Self-pay | Admitting: General Surgery

## 2024-03-19 DIAGNOSIS — E782 Mixed hyperlipidemia: Secondary | ICD-10-CM | POA: Diagnosis not present

## 2024-03-19 DIAGNOSIS — I5032 Chronic diastolic (congestive) heart failure: Secondary | ICD-10-CM

## 2024-03-19 DIAGNOSIS — K561 Intussusception: Secondary | ICD-10-CM | POA: Diagnosis not present

## 2024-03-19 DIAGNOSIS — Z01818 Encounter for other preprocedural examination: Secondary | ICD-10-CM

## 2024-03-19 DIAGNOSIS — I10 Essential (primary) hypertension: Secondary | ICD-10-CM | POA: Diagnosis not present

## 2024-03-19 LAB — CBC WITH DIFFERENTIAL/PLATELET
Abs Immature Granulocytes: 0.06 10*3/uL (ref 0.00–0.07)
Basophils Absolute: 0 10*3/uL (ref 0.0–0.1)
Basophils Relative: 0 %
Eosinophils Absolute: 0 10*3/uL (ref 0.0–0.5)
Eosinophils Relative: 0 %
HCT: 38.4 % (ref 36.0–46.0)
Hemoglobin: 12.1 g/dL (ref 12.0–15.0)
Immature Granulocytes: 1 %
Lymphocytes Relative: 7 %
Lymphs Abs: 0.8 10*3/uL (ref 0.7–4.0)
MCH: 30.4 pg (ref 26.0–34.0)
MCHC: 31.5 g/dL (ref 30.0–36.0)
MCV: 96.5 fL (ref 80.0–100.0)
Monocytes Absolute: 1.4 10*3/uL — ABNORMAL HIGH (ref 0.1–1.0)
Monocytes Relative: 12 %
Neutro Abs: 8.9 10*3/uL — ABNORMAL HIGH (ref 1.7–7.7)
Neutrophils Relative %: 80 %
Platelets: 290 10*3/uL (ref 150–400)
RBC: 3.98 MIL/uL (ref 3.87–5.11)
RDW: 13.9 % (ref 11.5–15.5)
WBC: 11.1 10*3/uL — ABNORMAL HIGH (ref 4.0–10.5)
nRBC: 0 % (ref 0.0–0.2)

## 2024-03-19 LAB — BASIC METABOLIC PANEL WITH GFR
Anion gap: 9 (ref 5–15)
BUN: 18 mg/dL (ref 8–23)
CO2: 22 mmol/L (ref 22–32)
Calcium: 8.6 mg/dL — ABNORMAL LOW (ref 8.9–10.3)
Chloride: 106 mmol/L (ref 98–111)
Creatinine, Ser: 1.35 mg/dL — ABNORMAL HIGH (ref 0.44–1.00)
GFR, Estimated: 37 mL/min — ABNORMAL LOW (ref >60)
Glucose, Bld: 126 mg/dL — ABNORMAL HIGH (ref 70–99)
Potassium: 5.1 mmol/L (ref 3.5–5.1)
Sodium: 137 mmol/L (ref 135–145)

## 2024-03-19 LAB — CEA: CEA: 1.6 ng/mL (ref 0.0–4.7)

## 2024-03-19 MED ORDER — ISOSORBIDE MONONITRATE ER 30 MG PO TB24
30.0000 mg | ORAL_TABLET | Freq: Every day | ORAL | Status: DC
  Administered 2024-03-19 – 2024-03-22 (×4): 30 mg via ORAL
  Filled 2024-03-19 (×4): qty 1

## 2024-03-19 MED ORDER — ENOXAPARIN SODIUM 30 MG/0.3ML IJ SOSY
30.0000 mg | PREFILLED_SYRINGE | INTRAMUSCULAR | Status: DC
  Administered 2024-03-19 – 2024-03-21 (×3): 30 mg via SUBCUTANEOUS
  Filled 2024-03-19 (×3): qty 0.3

## 2024-03-19 NOTE — Progress Notes (Signed)
  1 Day Post-Op   Chief Complaint/Subjective: Tolerating liquids, ostomy output this morning, pain controlled  Objective: Vital signs in last 24 hours: Temp:  [97.5 F (36.4 C)-98.4 F (36.9 C)] 97.7 F (36.5 C) (06/10 0603) Pulse Rate:  [52-74] 62 (06/10 0603) Resp:  [11-19] 18 (06/10 0603) BP: (119-200)/(61-86) 135/62 (06/10 0603) SpO2:  [94 %-100 %] 100 % (06/10 0603) Weight:  [61.2 kg] 61.2 kg (06/09 0905) Last BM Date : 03/18/24 Intake/Output from previous day: 06/09 0701 - 06/10 0700 In: 1123.5 [I.V.:1123.5] Out: 400 [Urine:200; Stool:150; Blood:50]  PE: Gen: NAD Resp: nonlabored Card: RRR Abd: soft, incision c/d/I, ostomy with minimal fecal matter  Lab Results:  Recent Labs    03/18/24 0528 03/19/24 0557  WBC 8.8 11.1*  HGB 11.4* 12.1  HCT 36.4 38.4  PLT 254 290   Recent Labs    03/18/24 0528 03/19/24 0557  NA 139 137  K 4.0 5.1  CL 108 106  CO2 21* 22  GLUCOSE 101* 126*  BUN 22 18  CREATININE 1.27* 1.35*  CALCIUM  8.8* 8.6*   No results for input(s): "LABPROT", "INR" in the last 72 hours.    Component Value Date/Time   NA 137 03/19/2024 0557   NA 142 01/23/2023 1507   K 5.1 03/19/2024 0557   CL 106 03/19/2024 0557   CO2 22 03/19/2024 0557   GLUCOSE 126 (H) 03/19/2024 0557   BUN 18 03/19/2024 0557   BUN 17 01/23/2023 1507   CREATININE 1.35 (H) 03/19/2024 0557   CREATININE 1.03 12/02/2014 1631   CALCIUM  8.6 (L) 03/19/2024 0557   PROT 6.7 03/15/2024 0957   PROT 6.2 04/22/2022 1143   ALBUMIN 3.5 03/15/2024 0957   ALBUMIN 3.9 04/22/2022 1143   AST 17 03/15/2024 0957   ALT 11 03/15/2024 0957   ALKPHOS 58 03/15/2024 0957   BILITOT 0.9 03/15/2024 0957   BILITOT 0.4 04/22/2022 1143   GFRNONAA 37 (L) 03/19/2024 0557   GFRAA 50 (L) 12/20/2019 1530    Assessment/Plan  s/p Procedure(s): PARTIAL COLECTOMY WITH COLOSTOMY 03/18/2024    FEN - adv to soft diet VTE - lovenox  ID - periop abx complete Disposition - PT/ambulate   LOS: 4 days    I reviewed last 24 h vitals and pain scores, last 48 h intake and output, last 24 h labs and trends, and last 24 h imaging results.  This care required moderate level of medical decision making.   Alphonso Aschoff [facility] at [facility] 03/19/2024, 8:13 AM Please see Amion for pager number during day hours 7:00am-4:30pm or 7:00am -11:30am on weekends

## 2024-03-19 NOTE — Plan of Care (Signed)
  Problem: Clinical Measurements: Goal: Will remain free from infection Outcome: Progressing   Problem: Activity: Goal: Risk for activity intolerance will decrease Outcome: Progressing   Problem: Elimination: Goal: Will not experience complications related to urinary retention Outcome: Progressing

## 2024-03-19 NOTE — Progress Notes (Signed)
 Patient ambulated with front wheel walker with standby assist 31ft in hallway.

## 2024-03-19 NOTE — Progress Notes (Signed)
 Progress Note  Patient Name: Cheryl Anderson Date of Encounter: 03/19/2024  Primary Cardiologist: Alexandria Angel, MD  Interval Summary  Chart reviewed.  POD#1 from partial colectomy with colostomy.  Denies any CP or SOB.  Tele shows NSR.  Vital Signs  Vitals:   03/18/24 1928 03/19/24 0603 03/19/24 0926 03/19/24 1140  BP: 119/65 135/62 135/62 (!) 108/43  Pulse: 74 62  66  Resp: 16 18    Temp: 98 F (36.7 C) 97.7 F (36.5 C)  98.1 F (36.7 C)  TempSrc:      SpO2: 100% 100%  96%  Weight:      Height:        Intake/Output Summary (Last 24 hours) at 03/19/2024 1151 Last data filed at 03/19/2024 0500 Gross per 24 hour  Intake 1123.5 ml  Output 400 ml  Net 723.5 ml   Filed Weights   03/18/24 0905  Weight: 61.2 kg    Physical Exam  GEN: Well nourished, well developed in no acute distress HEENT: Normal NECK: No JVD; No carotid bruits LYMPHATICS: No lymphadenopathy CARDIAC:RRR, no murmurs, rubs, gallops RESPIRATORY:  Clear to auscultation without rales, wheezing or rhonchi  ABDOMEN: Soft, non-tender, non-distended MUSCULOSKELETAL:  No edema; No deformity  SKIN: Warm and dry NEUROLOGIC:  Alert and oriented x 3 PSYCHIATRIC:  Normal affect   ECG/Telemetry Telemetry reviewed showing NSR>>personally reviewed  Labs  Chemistry Recent Labs  Lab 03/15/24 0957 03/16/24 0454 03/17/24 0601 03/18/24 0528 03/19/24 0557  NA 139   < > 141 139 137  K 3.8   < > 3.9 4.0 5.1  CL 106   < > 111 108 106  CO2 24   < > 23 21* 22  GLUCOSE 113*   < > 94 101* 126*  BUN 20   < > 16 22 18   CREATININE 1.03*   < > 1.10* 1.27* 1.35*  CALCIUM  8.9   < > 8.5* 8.8* 8.6*  PROT 6.7  --   --   --   --   ALBUMIN 3.5  --   --   --   --   AST 17  --   --   --   --   ALT 11  --   --   --   --   ALKPHOS 58  --   --   --   --   BILITOT 0.9  --   --   --   --   GFRNONAA 51*   < > 47* 39* 37*  ANIONGAP 9   < > 7 10 9    < > = values in this interval not displayed.    Hematology Recent  Labs  Lab 03/17/24 0601 03/18/24 0528 03/19/24 0557  WBC 8.7 8.8 11.1*  RBC 3.84* 3.77* 3.98  HGB 11.6* 11.4* 12.1  HCT 37.3 36.4 38.4  MCV 97.1 96.6 96.5  MCH 30.2 30.2 30.4  MCHC 31.1 31.3 31.5  RDW 13.7 13.7 13.9  PLT 260 254 290   Cardiac Studies  Echocardiogram 03/16/2024:  1. Left ventricular ejection fraction, by estimation, is 55 to 60%. The  left ventricle has normal function. The left ventricle has no regional  wall motion abnormalities. Left ventricular diastolic parameters are  consistent with Grade I diastolic  dysfunction (impaired relaxation).   2. Right ventricular systolic function is normal. The right ventricular  size is normal.   3. The mitral valve is abnormal. Trivial mitral valve regurgitation. No  evidence of mitral stenosis.  4. The aortic valve is tricuspid. Aortic valve regurgitation is not  visualized. No aortic stenosis is present.   5. The inferior vena cava is normal in size with greater than 50%  respiratory variability, suggesting right atrial pressure of 3 mmHg.   Assessment & Plan  # Preoperative cardiac evaluation - No definitive ischemic heart disease although no recent evaluation (negative Myoview in 2007).   - She reports no angina with baseline low-level activity.   - Follow-up echocardiogram shows LVEF 55 to 60% and no major valvular abnormalities. - POD #1 s/p partial colectomy with colostomy - denies any CP or SOB   #Primary hypertension - BP stable at 135/62 mmHg - Continue home dose of Altace  5 mg daily  #HFPEF - 2D echo this admission EF 55 to 60% with G1 DD and normal RV function - Appears euvolemic on exam today - Restart PTA Lasix  40 mg daily   #Mixed hyperlipidemia - Restart PTA atorvastatin  80 mg daily  [facility] will sign off.   Medication Recommendations: Atorvastatin  80 mg daily, Lasix  40 mg daily, ramipril  5 mg daily Other recommendations (labs, testing, etc): None Follow up as an outpatient: She has  follow-up with Dr. Audery Blazing on 04/18/2024    For questions or updates, please contact [facility] Please consult [email or web address] for contact info under   Signed, Gaylyn Keas, MD  03/19/2024, 11:51 AM

## 2024-03-19 NOTE — Progress Notes (Signed)
 TRH ROUNDING NOTE BONNY VANLEEUWEN NFA:[id number]  DOB: 12/22/1928  DOA: 03/15/2024  PCP: Clem Currier, DO  03/19/2024,1:56 PM  LOS: 4 days    Code Status: Full code   from: Home current Dispo: Likely home   89 year old home dwelling female HFpEF echo 2023 grade 1 DD HTN HLD Lower extremity edema with venous reflux follows with vascular Lower extremity neuropathy, BPPV  Developed abdominal pain nausea discomfort 6/5 sounds like she developed colonic intussusception CT showed colonic intussusception mid transverse colon lobulated enhancing tissue?  Neoplasm lead point, bilateral renal cysts including left upper pole cyst 1.4 intermittent density unclearly characterized BUN/creatinine 20/1.0 WBC 8 hemoglobin 12 platelet 284 UA negative General surgery consulted Cardiology consulted for clearance   Plan  Colonic intussusception status post surgery with end colostomy 6/9 Graduated diet as per general surgery currently on soft and hopeful if tolerates we can disposition Watch potassium closely-saline lock Pain control Tylenol  650 every 6 as needed, Oxy IR every 4 as needed moderate pain and severe pain morphine  2 mg every 4   HFpEF Would hold aspirin Can continue Altace  5 daily, resume Imdur    DVT prophylaxis:  Lovenox  for now  Status is: Inpatient Remains inpatient appropriate because:   Surgery performed  Subjective:  Overall looks well no distress no fever no chills no nausea no vomiting  Objective + exam Vitals:   03/18/24 1928 03/19/24 0603 03/19/24 0926 03/19/24 1140  BP: 119/65 135/62 135/62 (!) 108/43  Pulse: 74 62  66  Resp: 16 18    Temp: 98 F (36.7 C) 97.7 F (36.5 C)  98.1 F (36.7 C)  TempSrc:    Oral  SpO2: 100% 100%  96%  Weight:      Height:       Filed Weights   03/18/24 0905  Weight: 61.2 kg    Examination:  EOMI NCAT on oxygen Chest clear S1-S2 sinus rhythm Ostomy in place bag filled with liquid stool No lower remedy edema Coherent  Data  Reviewed: reviewed   CBC    Component Value Date/Time   WBC 11.1 (H) 03/19/2024 0557   RBC 3.98 03/19/2024 0557   HGB 12.1 03/19/2024 0557   HGB 12.1 01/23/2023 1507   HCT 38.4 03/19/2024 0557   HCT 37.4 01/23/2023 1507   PLT 290 03/19/2024 0557   PLT 260 01/23/2023 1507   MCV 96.5 03/19/2024 0557   MCV 92 01/23/2023 1507   MCH 30.4 03/19/2024 0557   MCHC 31.5 03/19/2024 0557   RDW 13.9 03/19/2024 0557   RDW 13.0 01/23/2023 1507   LYMPHSABS 0.8 03/19/2024 0557   LYMPHSABS 2.0 03/15/2021 1438   MONOABS 1.4 (H) 03/19/2024 0557   EOSABS 0.0 03/19/2024 0557   EOSABS 0.1 03/15/2021 1438   BASOSABS 0.0 03/19/2024 0557   BASOSABS 0.1 03/15/2021 1438      Latest Ref Rng & Units 03/19/2024    5:57 AM 03/18/2024    5:28 AM 03/17/2024    6:01 AM  CMP  Glucose 70 - 99 mg/dL 469  629  94   BUN 8 - 23 mg/dL 18  22  16    Creatinine 0.44 - 1.00 mg/dL 5.28  4.13  2.44   Sodium 135 - 145 mmol/L 137  139  141   Potassium 3.5 - 5.1 mmol/L 5.1  4.0  3.9   Chloride 98 - 111 mmol/L 106  108  111   CO2 22 - 32 mmol/L 22  21  23    Calcium   8.9 - 10.3 mg/dL 8.6  8.8  8.5     Scheduled Meds:  enoxaparin  (LOVENOX ) injection  30 mg Subcutaneous Q24H   isosorbide  mononitrate  30 mg Oral Daily   ramipril   5 mg Oral Daily   Continuous Infusions:  Time  13  Jai-Gurmukh Quinne Pires, MD  [facility]

## 2024-03-19 NOTE — Plan of Care (Signed)

## 2024-03-19 NOTE — Consult Note (Signed)
 WOC Nurse ostomy follow up Stoma type/location: RUQ; transverse colostomy  Stomal assessment/size: aprx 2" budded, pink, moist, edematous, pouch was changed on night shift due to leaking IV  fluids all in bed per patient Peristomal assessment: NA Treatment options for stomal/peristomal skin: NA Output liquid brown Ostomy pouching: 2pc. 2 3/4" in place  Education provided:  Met with patient and her daughter [name] at bedside. Patient sitting on the side of the bed eating breakfast.  Explained role of ostomy nurse and creation of stoma  Explained stoma characteristics (budded, flush, color, texture, care) Provided patient with [employer]  Discussed SS program and outpatient ostomy clinic Supplies placed in the room for teaching session. Daughters to be present tomorrow for teaching.      Enrolled patient in [place] [employer] Discharge program: Yes  WOC Nurse will follow along with you for continued support with ostomy teaching and care Tambi Thole [facility] MSN, RN, [place], CNS, [place] [phone]

## 2024-03-20 DIAGNOSIS — K561 Intussusception: Secondary | ICD-10-CM | POA: Diagnosis not present

## 2024-03-20 LAB — CBC WITH DIFFERENTIAL/PLATELET
Abs Immature Granulocytes: 0.05 10*3/uL (ref 0.00–0.07)
Basophils Absolute: 0 10*3/uL (ref 0.0–0.1)
Basophils Relative: 0 %
Eosinophils Absolute: 0.1 10*3/uL (ref 0.0–0.5)
Eosinophils Relative: 1 %
HCT: 33.5 % — ABNORMAL LOW (ref 36.0–46.0)
Hemoglobin: 10.1 g/dL — ABNORMAL LOW (ref 12.0–15.0)
Immature Granulocytes: 1 %
Lymphocytes Relative: 15 %
Lymphs Abs: 1.4 10*3/uL (ref 0.7–4.0)
MCH: 30.6 pg (ref 26.0–34.0)
MCHC: 30.1 g/dL (ref 30.0–36.0)
MCV: 101.5 fL — ABNORMAL HIGH (ref 80.0–100.0)
Monocytes Absolute: 0.9 10*3/uL (ref 0.1–1.0)
Monocytes Relative: 10 %
Neutro Abs: 6.9 10*3/uL (ref 1.7–7.7)
Neutrophils Relative %: 73 %
Platelets: 206 10*3/uL (ref 150–400)
RBC: 3.3 MIL/uL — ABNORMAL LOW (ref 3.87–5.11)
RDW: 14.5 % (ref 11.5–15.5)
WBC: 9.3 10*3/uL (ref 4.0–10.5)
nRBC: 0 % (ref 0.0–0.2)

## 2024-03-20 LAB — BASIC METABOLIC PANEL WITH GFR
Anion gap: 6 (ref 5–15)
Anion gap: 6 (ref 5–15)
BUN: 42 mg/dL — ABNORMAL HIGH (ref 8–23)
BUN: 40 mg/dL — ABNORMAL HIGH (ref 8–23)
CO2: 21 mmol/L — ABNORMAL LOW (ref 22–32)
CO2: 24 mmol/L (ref 22–32)
Calcium: 8.3 mg/dL — ABNORMAL LOW (ref 8.9–10.3)
Calcium: 8.5 mg/dL — ABNORMAL LOW (ref 8.9–10.3)
Chloride: 112 mmol/L — ABNORMAL HIGH (ref 98–111)
Chloride: 108 mmol/L (ref 98–111)
Creatinine, Ser: 2.54 mg/dL — ABNORMAL HIGH (ref 0.44–1.00)
Creatinine, Ser: 2.39 mg/dL — ABNORMAL HIGH (ref 0.44–1.00)
GFR, Estimated: 17 mL/min — ABNORMAL LOW (ref >60)
GFR, Estimated: 18 mL/min — ABNORMAL LOW (ref >60)
Glucose, Bld: 96 mg/dL (ref 70–99)
Glucose, Bld: 95 mg/dL (ref 70–99)
Potassium: 4.8 mmol/L (ref 3.5–5.1)
Potassium: 4.3 mmol/L (ref 3.5–5.1)
Sodium: 139 mmol/L (ref 135–145)
Sodium: 138 mmol/L (ref 135–145)

## 2024-03-20 LAB — SURGICAL PATHOLOGY

## 2024-03-20 MED ORDER — SODIUM CHLORIDE 0.9 % IV SOLN: INTRAVENOUS | Status: AC

## 2024-03-20 NOTE — Evaluation (Signed)
 Physical Therapy Evaluation Patient Details Name: Cheryl Anderson MRN: [id number] DOB: September 22, 1929 Today's Date: 03/20/2024  History of Present Illness  89 year old home dwelling female admitted with colonic instussusception, s/p colectomy with end colostomy 03/18/24. PMH:  HFpEF echo 2023 grade 1 DD HTN HLD  Lower extremity edema, LE neuropathy, BPPV.  Clinical Impression  Pt admitted with above diagnosis. Instructed pt/daughter in log roll technique for bed mobility. Pt ambulated 38' with RW, no loss of balance. Good progress expected. At baseline pt ambulates without an assistive device and is independent with ADLs. She has needed level of assistance from family at home.  Pt currently with functional limitations due to the deficits listed below (see PT Problem List). Pt will benefit from acute skilled PT to increase their independence and safety with mobility to allow discharge.           If plan is discharge home, recommend the following: A little help with walking and/or transfers;A little help with bathing/dressing/bathroom;Assistance with cooking/housework;Assist for transportation;Help with stairs or ramp for entrance   Can travel by private vehicle        Equipment Recommendations Rolling walker (2 wheels)  Recommendations for Other Services       Functional Status Assessment Patient has had a recent decline in their functional status and demonstrates the ability to make significant improvements in function in a reasonable and predictable amount of time.     Precautions / Restrictions Precautions Precautions: Other (comment) Recall of Precautions/Restrictions: Intact Precaution/Restrictions Comments: abdominal Restrictions Weight Bearing Restrictions Per Provider Order: No      Mobility  Bed Mobility Overal bed mobility: Needs Assistance Bed Mobility: Rolling, Sidelying to Sit Rolling: Min assist Sidelying to sit: Min assist, HOB elevated, Used rails       General  bed mobility comments: VCs for log roll, min A to initiate roll and to raise trunk    Transfers Overall transfer level: Needs assistance Equipment used: Rolling walker (2 wheels) Transfers: Sit to/from Stand Sit to Stand: Contact guard assist           General transfer comment: VCs hand placement    Ambulation/Gait Ambulation/Gait assistance: Contact guard assist Gait Distance (Feet): 70 Feet Assistive device: Rolling walker (2 wheels) Gait Pattern/deviations: Step-through pattern, Trunk flexed, Decreased stride length       General Gait Details: VCs for proximity to [employer]            Wheelchair Mobility     Tilt Bed    Modified Rankin (Stroke Patients Only)       Balance Overall balance assessment: Modified Independent                                           Pertinent Vitals/Pain Pain Assessment Pain Assessment: No/denies pain Breathing: normal Negative Vocalization: none Facial Expression: smiling or inexpressive Body Language: relaxed Consolability: no need to console PAINAD Score: 0    Home Living Family/patient expects to be discharged to:: Private residence Living Arrangements: Children Available Help at Discharge: Family   Home Access: Stairs to enter Entrance Stairs-Rails: None Entrance Stairs-Number of Steps: 1   Home Layout: One level Home Equipment: Rollator (4 wheels) Additional Comments: lives with daughter    Prior Function Prior Level of Function : Independent/Modified Independent             Mobility Comments: walks without AD,  denies falls in past 6 months ADLs Comments: independent with bathing/dressing; does not drive     Extremity/Trunk Assessment   Upper Extremity Assessment Upper Extremity Assessment: Overall WFL for tasks assessed    Lower Extremity Assessment Lower Extremity Assessment: Overall WFL for tasks assessed    Cervical / Trunk Assessment Cervical / Trunk Assessment:  Normal  Communication   Communication Communication: No apparent difficulties    Cognition Arousal: Alert Behavior During Therapy: WFL for tasks assessed/performed   PT - Cognitive impairments: No apparent impairments                         Following commands: Intact       Cueing       General Comments      Exercises     Assessment/Plan    PT Assessment Patient needs continued PT services  PT Problem List Decreased activity tolerance;Decreased mobility       PT Treatment Interventions DME instruction;Gait training;Therapeutic exercise;Functional mobility training    PT Goals (Current goals can be found in the Care Plan section)  Acute Rehab PT Goals Patient Stated Goal: likes to cook and clean her kitchen; does puzzles on ipad PT Goal Formulation: With patient/family Time For Goal Achievement: 04/03/24 Potential to Achieve Goals: Good    Frequency Min 3X/week     Co-evaluation               AM-PAC PT 6 Clicks Mobility  Outcome Measure Help needed turning from your back to your side while in a flat bed without using bedrails?: A Little Help needed moving from lying on your back to sitting on the side of a flat bed without using bedrails?: A Little Help needed moving to and from a bed to a chair (including a wheelchair)?: None Help needed standing up from a chair using your arms (e.g., wheelchair or bedside chair)?: None Help needed to walk in hospital room?: A Little Help needed climbing 3-5 steps with a railing? : A Little 6 Click Score: 20    End of Session Equipment Utilized During Treatment: Gait belt Activity Tolerance: Patient tolerated treatment well Patient left: in chair;with call bell/phone within reach;with family/visitor present Nurse Communication: Mobility status PT Visit Diagnosis: Difficulty in walking, not elsewhere classified (R26.2)    Time: 6578-4696 PT Time Calculation (min) (ACUTE ONLY): 15 min   Charges:   PT  Evaluation $PT Eval Moderate Complexity: 1 Mod   PT General Charges $$ ACUTE PT VISIT: 1 Visit         Daymon Evans PT 03/20/2024  Acute Rehabilitation Services  Office [phone]

## 2024-03-20 NOTE — Progress Notes (Signed)
 PROGRESS NOTE    Cheryl Anderson  ZOX:[id number] DOB: 1929/08/16 DOA: 03/15/2024 PCP: Clem Currier, DO    Brief Narrative:    Assessment and Plan:  Colonic intussusception s/p surgery with end colostomy 6/9 -Graduated diet as per general surgery currently on soft  -ambulate Pain control Tylenol  650 every 6 as needed, Oxy IR every 4 as needed moderate pain and severe pain morphine  2 mg every 4     HFpEF Would hold aspirin -hold ACE  AKI? -repeat labs this AM   Home health PT ordered Home once cleared by GS   DVT prophylaxis: enoxaparin  (LOVENOX ) injection 30 mg Start: 03/19/24 1700    Code Status: Full Code    Disposition Plan:  Level of care: Med-Surg Status is: Inpatient     Consultants:  GS  Subjective: Says she is not eating much  Objective: Vitals:   03/19/24 0926 03/19/24 1140 03/19/24 1946 03/20/24 0416  BP: 135/62 (!) 108/43 (!) 109/51 (!) 102/45  Pulse:  66 67 78  Resp:   19 18  Temp:  98.1 F (36.7 C) 98 F (36.7 C) 97.8 F (36.6 C)  TempSrc:  Oral Oral Oral  SpO2:  96% 96% 96%  Weight:      Height:        Intake/Output Summary (Last 24 hours) at 03/20/2024 1050 Last data filed at 03/20/2024 0815 Gross per 24 hour  Intake 240 ml  Output 550 ml  Net -310 ml   Filed Weights   03/18/24 0905  Weight: 61.2 kg    Examination:   General: Appearance:    Well developed, well nourished female in no acute distress     Lungs:     respirations unlabored  Heart:    Normal heart rate. Normal rhythm. No murmurs, rubs, or gallops.    MS:   All extremities are intact.    Neurologic:   Awake, alert, oriented x 3. No apparent focal neurological           defect.        Data Reviewed: I have personally reviewed following labs and imaging studies  CBC: Recent Labs  Lab 03/16/24 0454 03/17/24 0601 03/18/24 0528 03/19/24 0557 03/20/24 0556  WBC 7.1 8.7 8.8 11.1* 9.3  NEUTROABS  --  6.7 6.5 8.9* 6.9  HGB 11.8* 11.6* 11.4* 12.1 10.1*   HCT 38.3 37.3 36.4 38.4 33.5*  MCV 99.2 97.1 96.6 96.5 101.5*  PLT 265 260 254 290 206   Basic Metabolic Panel: Recent Labs  Lab 03/16/24 0454 03/17/24 0601 03/18/24 0528 03/19/24 0557 03/20/24 0556  NA 139 141 139 137 139  K 4.3 3.9 4.0 5.1 4.8  CL 109 111 108 106 112*  CO2 24 23 21* 22 21*  GLUCOSE 102* 94 101* 126* 96  BUN 15 16 22 18  42*  CREATININE 1.07* 1.10* 1.27* 1.35* 2.54*  CALCIUM  8.6* 8.5* 8.8* 8.6* 8.3*   GFR: Estimated Creatinine Clearance: 12.5 mL/min (A) (by C-G formula based on SCr of 2.54 mg/dL (H)). Liver Function Tests: Recent Labs  Lab 03/15/24 0957  AST 17  ALT 11  ALKPHOS 58  BILITOT 0.9  PROT 6.7  ALBUMIN 3.5   Recent Labs  Lab 03/15/24 0957  LIPASE 31   No results for input(s): AMMONIA in the last 168 hours. Coagulation Profile: No results for input(s): INR, PROTIME in the last 168 hours. Cardiac Enzymes: No results for input(s): CKTOTAL, CKMB, CKMBINDEX, TROPONINI in the last 168 hours. BNP (last  3 results) No results for input(s): PROBNP in the last 8760 hours. HbA1C: No results for input(s): HGBA1C in the last 72 hours. CBG: No results for input(s): GLUCAP in the last 168 hours. Lipid Profile: No results for input(s): CHOL, HDL, LDLCALC, TRIG, CHOLHDL, LDLDIRECT in the last 72 hours. Thyroid Function Tests: No results for input(s): TSH, T4TOTAL, FREET4, T3FREE, THYROIDAB in the last 72 hours. Anemia Panel: No results for input(s): VITAMINB12, FOLATE, FERRITIN, TIBC, IRON, RETICCTPCT in the last 72 hours. Sepsis Labs: Recent Labs  Lab 03/15/24 1126  LATICACIDVEN 0.7    No results found for this or any previous visit (from the past 240 hours).       Radiology Studies: No results found.      Scheduled Meds:  enoxaparin  (LOVENOX ) injection  30 mg Subcutaneous Q24H   isosorbide  mononitrate  30 mg Oral Daily   Continuous Infusions:   LOS: 5 days    Time  spent: 45 minutes spent on chart review, discussion with nursing staff, consultants, updating family and interview/physical exam; more than 50% of that time was spent in counseling and/or coordination of care.    Enrigue Harvard, DO [facility] Available via Epic secure chat 7am-7pm After these hours, please refer to coverage provider listed on [email or web address] 03/20/2024, 10:50 AM

## 2024-03-20 NOTE — Plan of Care (Signed)
  Problem: Bowel/Gastric: Goal: Gastrointestinal status for postoperative course will improve Outcome: Progressing   Problem: Nutritional: Goal: Will attain and maintain optimal nutritional status will improve Outcome: Progressing   Problem: Clinical Measurements: Goal: Postoperative complications will be avoided or minimized Outcome: Progressing

## 2024-03-20 NOTE — Consult Note (Signed)
 WOC Nurse ostomy follow up Stoma type/location:  RUQ, transverse colostomy Stomal assessment/size: 2 round, budded, pink, moist Peristomal assessment: intact, slight bulge noted proximal border of the stoma  Treatment options for stomal/peristomal skin: 2 ostomy barrier ring Output liquid brown Ostomy pouching: 2pc. 2 3/4 with 2 barrier ring Education provided:  Explained role of ostomy nurse and creation of stoma  Explained stoma characteristics (budded, flush, color, texture, care) Demonstrated pouch change (cutting new skin barrier, measuring stoma, cleaning peristomal skin and stoma, use of barrier ring) Allowed daughter to cut new skin barrier Education on emptying when 1/3 to 1/2 full and how to empty Demonstrated burping flatus from pouch Demonstrated use of wick to clean spout  Discussed bathing, diet, gas, medication use, constipation Discussed risk of peristomal hernia Provided patient with [employer] and marked items currently using 8 pouches/barrier rings in the room [employer] information provided for undergarments for ostomy patients.   Requested [facility] and referral to ostomy clinic      Enrolled patient in [facility] Discharge program: Yes Elwanda Moger [facility], CNS, [place] [phone]

## 2024-03-20 NOTE — TOC Transition Note (Signed)
 Transition of Care [facility]) - Discharge Note   Patient Details  Name: Cheryl Anderson MRN: [id number] Date of Birth: 07/28/1929  Transition of Care [facility]) CM/SW Contact:  Marty Sleet, LCSW Phone Number: 03/20/2024, 1:20 PM   Clinical Narrative:    Met with pt and daughter and confirmed plan for pt to return home with home health services. Pt's daughter denies pt having HH in the past and does not have a preference for agency. HHPT/RN has been arranged with [facility]. Pt also recommended for a RW; pt and daughter agreeable to having RW ordered. RW has been ordered through [facility] to be delivered to pt's room prior to discharge.   Final next level of care: Home w Home Health Services Barriers to Discharge: No Barriers Identified   Patient Goals and CMS Choice Patient states their goals for this hospitalization and ongoing recovery are:: To return home CMS Medicare.gov Compare Post Acute Care list provided to:: Patient Choice offered to / list presented to : Patient [place] ownership interest in [facility].provided to::  (NA)    Discharge Placement                       Discharge Plan and Services Additional resources added to the After Visit Summary for                  DME Arranged: Walker rolling DME Agency: [facility] Date DME Agency Contacted: 03/20/24 Time DME Agency Contacted: 1031 Representative spoke with at DME Agency: Zack HH Arranged: RN, PT HH Agency: [facility] Date HH Agency Contacted: 03/20/24 Time HH Agency Contacted: 1320 Representative spoke with at [facility] Agency: Renetta Carter  Social Drivers of Health (SDOH) Interventions SDOH Screenings   Food Insecurity: No Food Insecurity (03/15/2024)  Housing: Low Risk  (03/15/2024)  Transportation Needs: No Transportation Needs (03/15/2024)  Utilities: Not At Risk (03/15/2024)  Alcohol Screen: Low Risk  (06/20/2023)  Depression (PHQ2-9): Low Risk  (06/20/2023)  Financial Resource  Strain: Low Risk  (06/20/2023)  Physical Activity: Inactive (06/20/2023)  Social Connections: Socially Isolated (03/15/2024)  Stress: No Stress Concern Present (06/20/2023)  Tobacco Use: Medium Risk (03/18/2024)  Health Literacy: Adequate Health Literacy (06/20/2023)     Readmission Risk Interventions    03/20/2024   10:30 AM 03/18/2024    9:36 AM  Readmission Risk Prevention Plan  Post Dischage Appt  Complete  Medication Screening  Complete  Transportation Screening Complete Complete  PCP or Specialist Appt within 5-7 Days Complete   Home Care Screening Complete   Medication Review (RN CM) Complete

## 2024-03-21 DIAGNOSIS — K561 Intussusception: Secondary | ICD-10-CM | POA: Diagnosis not present

## 2024-03-21 LAB — CBC WITH DIFFERENTIAL/PLATELET
Abs Immature Granulocytes: 0.04 10*3/uL (ref 0.00–0.07)
Basophils Absolute: 0 10*3/uL (ref 0.0–0.1)
Basophils Relative: 0 %
Eosinophils Absolute: 0.2 10*3/uL (ref 0.0–0.5)
Eosinophils Relative: 2 %
HCT: 32 % — ABNORMAL LOW (ref 36.0–46.0)
Hemoglobin: 9.7 g/dL — ABNORMAL LOW (ref 12.0–15.0)
Immature Granulocytes: 1 %
Lymphocytes Relative: 23 %
Lymphs Abs: 1.6 10*3/uL (ref 0.7–4.0)
MCH: 30.1 pg (ref 26.0–34.0)
MCHC: 30.3 g/dL (ref 30.0–36.0)
MCV: 99.4 fL (ref 80.0–100.0)
Monocytes Absolute: 0.7 10*3/uL (ref 0.1–1.0)
Monocytes Relative: 10 %
Neutro Abs: 4.6 10*3/uL (ref 1.7–7.7)
Neutrophils Relative %: 64 %
Platelets: 289 10*3/uL (ref 150–400)
RBC: 3.22 MIL/uL — ABNORMAL LOW (ref 3.87–5.11)
RDW: 14.6 % (ref 11.5–15.5)
WBC: 7.2 10*3/uL (ref 4.0–10.5)
nRBC: 0 % (ref 0.0–0.2)

## 2024-03-21 LAB — BASIC METABOLIC PANEL WITH GFR
Anion gap: 6 (ref 5–15)
BUN: 39 mg/dL — ABNORMAL HIGH (ref 8–23)
CO2: 23 mmol/L (ref 22–32)
Calcium: 8.2 mg/dL — ABNORMAL LOW (ref 8.9–10.3)
Chloride: 111 mmol/L (ref 98–111)
Creatinine, Ser: 1.92 mg/dL — ABNORMAL HIGH (ref 0.44–1.00)
GFR, Estimated: 24 mL/min — ABNORMAL LOW (ref >60)
Glucose, Bld: 98 mg/dL (ref 70–99)
Potassium: 3.9 mmol/L (ref 3.5–5.1)
Sodium: 140 mmol/L (ref 135–145)

## 2024-03-21 NOTE — Plan of Care (Signed)

## 2024-03-21 NOTE — Progress Notes (Signed)
 PROGRESS NOTE    Cheryl Anderson  ZOX:[id number] DOB: Jan 19, 1929 DOA: 03/15/2024 PCP: Clem Currier, DO    Brief Narrative:  89 year old home dwelling female HFpEF echo 2023 grade 1 DD HTN HLD   Assessment and Plan:  Colonic intussusception s/p surgery with end colostomy 6/9 -Graduated diet as per general surgery currently on soft  -ambulate Pain control Tylenol  650 every 6 as needed, Oxy IR every 4 as needed moderate pain and severe pain morphine  2 mg every 4     HFpEF -hold ACE  AKI? -repeat labs this AM - Improved with IV fluids   Home health PT ordered Home once cleared by GS   DVT prophylaxis: enoxaparin  (LOVENOX ) injection 30 mg Start: 03/19/24 1700    Code Status: Full Code    Disposition Plan:  Level of care: Med-Surg Status is: Inpatient     Consultants:  GS  Subjective: Feeling better this morning  Objective: Vitals:   03/20/24 0416 03/20/24 1228 03/20/24 2025 03/21/24 0455  BP: (!) 102/45 (!) 123/49 (!) 116/45 (!) 130/47  Pulse: 78 75 79 73  Resp: 18 16 18 18   Temp: 97.8 F (36.6 C) 97.6 F (36.4 C) 97.6 F (36.4 C) 97.9 F (36.6 C)  TempSrc: Oral     SpO2: 96% 97% 96% 98%  Weight:      Height:        Intake/Output Summary (Last 24 hours) at 03/21/2024 1144 Last data filed at 03/20/2024 1944 Gross per 24 hour  Intake 329.02 ml  Output 200 ml  Net 129.02 ml   Filed Weights   03/18/24 0905  Weight: 61.2 kg    Examination:   General: Appearance:    Well developed, well nourished female in no acute distress     Lungs:     respirations unlabored  Heart:    Normal heart rate.     MS:   All extremities are intact.    Neurologic:   Awake, alert       Data Reviewed: I have personally reviewed following labs and imaging studies  CBC: Recent Labs  Lab 03/17/24 0601 03/18/24 0528 03/19/24 0557 03/20/24 0556 03/21/24 0526  WBC 8.7 8.8 11.1* 9.3 7.2  NEUTROABS 6.7 6.5 8.9* 6.9 4.6  HGB 11.6* 11.4* 12.1 10.1* 9.7*  HCT  37.3 36.4 38.4 33.5* 32.0*  MCV 97.1 96.6 96.5 101.5* 99.4  PLT 260 254 290 206 289   Basic Metabolic Panel: Recent Labs  Lab 03/18/24 0528 03/19/24 0557 03/20/24 0556 03/20/24 0928 03/21/24 0526  NA 139 137 139 138 140  K 4.0 5.1 4.8 4.3 3.9  CL 108 106 112* 108 111  CO2 21* 22 21* 24 23  GLUCOSE 101* 126* 96 95 98  BUN 22 18 42* 40* 39*  CREATININE 1.27* 1.35* 2.54* 2.39* 1.92*  CALCIUM  8.8* 8.6* 8.3* 8.5* 8.2*   GFR: Estimated Creatinine Clearance: 16.5 mL/min (A) (by C-G formula based on SCr of 1.92 mg/dL (H)). Liver Function Tests: Recent Labs  Lab 03/15/24 0957  AST 17  ALT 11  ALKPHOS 58  BILITOT 0.9  PROT 6.7  ALBUMIN 3.5   Recent Labs  Lab 03/15/24 0957  LIPASE 31   No results for input(s): AMMONIA in the last 168 hours. Coagulation Profile: No results for input(s): INR, PROTIME in the last 168 hours. Cardiac Enzymes: No results for input(s): CKTOTAL, CKMB, CKMBINDEX, TROPONINI in the last 168 hours. BNP (last 3 results) No results for input(s): PROBNP in the last 8760  hours. HbA1C: No results for input(s): HGBA1C in the last 72 hours. CBG: No results for input(s): GLUCAP in the last 168 hours. Lipid Profile: No results for input(s): CHOL, HDL, LDLCALC, TRIG, CHOLHDL, LDLDIRECT in the last 72 hours. Thyroid Function Tests: No results for input(s): TSH, T4TOTAL, FREET4, T3FREE, THYROIDAB in the last 72 hours. Anemia Panel: No results for input(s): VITAMINB12, FOLATE, FERRITIN, TIBC, IRON, RETICCTPCT in the last 72 hours. Sepsis Labs: Recent Labs  Lab 03/15/24 1126  LATICACIDVEN 0.7    No results found for this or any previous visit (from the past 240 hours).       Radiology Studies: No results found.      Scheduled Meds:  enoxaparin  (LOVENOX ) injection  30 mg Subcutaneous Q24H   isosorbide  mononitrate  30 mg Oral Daily   Continuous Infusions:  sodium chloride  50 mL/hr at  03/20/24 1522     LOS: 6 days    Time spent: 45 minutes spent on chart review, discussion with nursing staff, consultants, updating family and interview/physical exam; more than 50% of that time was spent in counseling and/or coordination of care.    Enrigue Harvard, DO [facility] Available via Epic secure chat 7am-7pm After these hours, please refer to coverage provider listed on [email or web address] 03/21/2024, 11:44 AM

## 2024-03-21 NOTE — Discharge Instructions (Signed)
 [facility]      [facility], [place] [phone]  OPEN ABDOMINAL SURGERY: POST OP INSTRUCTIONS  Always review your discharge instruction sheet given to you by the facility where your surgery was performed.  IF YOU HAVE DISABILITY OR FAMILY LEAVE FORMS, YOU MUST BRING THEM TO THE OFFICE FOR PROCESSING.  PLEASE DO NOT GIVE THEM TO YOUR DOCTOR.  A prescription for pain medication may be given to you upon discharge.  Take your pain medication as prescribed, if needed.  If narcotic pain medicine is not needed, then you may take acetaminophen (Tylenol) or ibuprofen (Advil) as needed. Take your usually prescribed medications unless otherwise directed. If you need a refill on your pain medication, please contact your pharmacy. They will contact our office to request authorization.  Prescriptions will not be filled after 5pm or on week-ends. You should follow a light diet the first few days after arrival home, such as soup and crackers, pudding, etc.unless your doctor has advised otherwise. A high-fiber, low fat diet can be resumed as tolerated.   Be sure to include lots of fluids daily. Most patients will experience some swelling and bruising on the chest and neck area.  Ice packs will help.  Swelling and bruising can take several days to resolve Most patients will experience some swelling and bruising in the area of the incision. Ice pack will help. Swelling and bruising can take several days to resolve..  It is common to experience some constipation if taking pain medication after surgery.  Increasing fluid intake and taking a stool softener will usually help or prevent this problem from occurring.  A mild laxative (Milk of Magnesia or Miralax) should be taken according to package directions if there are no bowel movements after 48 hours.  You may have steri-strips (small skin tapes) in place directly over the incision.  These strips should be left on the skin for 7-10 days.  If your surgeon used skin  glue on the incision, you may shower in 24 hours.  The glue will flake off over the next 2-3 weeks.  Any sutures or staples will be removed at the office during your follow-up visit. You may find that a light gauze bandage over your incision may keep your staples from being rubbed or pulled. You may shower and replace the bandage daily. ACTIVITIES:  You may resume regular (light) daily activities beginning the next day--such as daily self-care, walking, climbing stairs--gradually increasing activities as tolerated.  You may have sexual intercourse when it is comfortable.  Refrain from any heavy lifting or straining until approved by your doctor. You may drive when you no longer are taking prescription pain medication, you can comfortably wear a seatbelt, and you can safely maneuver your car and apply brakes Return to Work: ___________________________________ Cheryl Anderson should see your doctor in the office for a follow-up appointment approximately two weeks after your surgery.  Make sure that you call for this appointment within a day or two after you arrive home to insure a convenient appointment time. OTHER INSTRUCTIONS:  _____________________________________________________________ _____________________________________________________________  WHEN TO CALL YOUR DOCTOR: Fever over 101.0 Inability to urinate Nausea and/or vomiting Extreme swelling or bruising Continued bleeding from incision. Increased pain, redness, or drainage from the incision. Difficulty swallowing or breathing Muscle cramping or spasms. Numbness or tingling in hands or feet or around lips.  The clinic staff is available to answer your questions during regular business hours.  Please don't hesitate to call and ask to speak to one of  the nurses if you have concerns.  For further questions, please visit [email or web address]

## 2024-03-21 NOTE — Progress Notes (Signed)
 This nurse observed daughter change patient's ostomy ring, wafer and pouch and demonstrate cleaning and correct placement of ring and wafer.  No issues and no complaints from patient observed.  Rainey Burden, RN

## 2024-03-21 NOTE — Progress Notes (Signed)
  3 Days Post-Op   Chief Complaint/Subjective: Pain moderately controlled  Objective: Vital signs in last 24 hours: Temp:  [97.6 F (36.4 C)-98.1 F (36.7 C)] 98.1 F (36.7 C) (06/12 1234) Pulse Rate:  [73-79] 77 (06/12 1234) Resp:  [18] 18 (06/12 0455) BP: (116-133)/(45-50) 133/50 (06/12 1234) SpO2:  [96 %-99 %] 99 % (06/12 1234) Last BM Date : 03/21/24 Intake/Output from previous day: 06/11 0701 - 06/12 0700 In: 449 [P.O.:360; I.V.:89] Out: 570 [Urine:500; Stool:70]  PE: Gen: NAd Resp: nonlabored Card: RRR Abd: incision c/d/I, leakage from ostomy site  Lab Results:  Recent Labs    03/20/24 0556 03/21/24 0526  WBC 9.3 7.2  HGB 10.1* 9.7*  HCT 33.5* 32.0*  PLT 206 289   Recent Labs    03/20/24 0928 03/21/24 0526  NA 138 140  K 4.3 3.9  CL 108 111  CO2 24 23  GLUCOSE 95 98  BUN 40* 39*  CREATININE 2.39* 1.92*  CALCIUM  8.5* 8.2*   No results for input(s): LABPROT, INR in the last 72 hours.    Component Value Date/Time   NA 140 03/21/2024 0526   NA 142 01/23/2023 1507   K 3.9 03/21/2024 0526   CL 111 03/21/2024 0526   CO2 23 03/21/2024 0526   GLUCOSE 98 03/21/2024 0526   BUN 39 (H) 03/21/2024 0526   BUN 17 01/23/2023 1507   CREATININE 1.92 (H) 03/21/2024 0526   CREATININE 1.03 12/02/2014 1631   CALCIUM  8.2 (L) 03/21/2024 0526   PROT 6.7 03/15/2024 0957   PROT 6.2 04/22/2022 1143   ALBUMIN 3.5 03/15/2024 0957   ALBUMIN 3.9 04/22/2022 1143   AST 17 03/15/2024 0957   ALT 11 03/15/2024 0957   ALKPHOS 58 03/15/2024 0957   BILITOT 0.9 03/15/2024 0957   BILITOT 0.4 04/22/2022 1143   GFRNONAA 24 (L) 03/21/2024 0526   GFRAA 50 (L) 12/20/2019 1530    Assessment/Plan  s/p Procedure(s): PARTIAL COLECTOMY WITH COLOSTOMY 03/18/2024  Pathology consistent with invasive cancer, 0/12 lymph nodes, margins well negative. T1N0M0. This would not be a cancer requiring systemic therapy. She may benefit from colonoscopy in the next year for surveillance.  FEN -  soft diet VTE - lovenox  ID - periop abx Disposition - inpatient   LOS: 6 days   I reviewed last 24 h vitals and pain scores, last 48 h intake and output, last 24 h labs and trends, and last 24 h imaging results.  This care required moderate level of medical decision making.   Alphonso Aschoff [facility] 03/21/2024, 2:04 PM Please see Amion for pager number during day hours 7:00am-4:30pm or 7:00am -11:30am on weekends

## 2024-03-22 DIAGNOSIS — K561 Intussusception: Secondary | ICD-10-CM | POA: Diagnosis not present

## 2024-03-22 LAB — CBC WITH DIFFERENTIAL/PLATELET
Abs Immature Granulocytes: 0.02 10*3/uL (ref 0.00–0.07)
Basophils Absolute: 0 10*3/uL (ref 0.0–0.1)
Basophils Relative: 1 %
Eosinophils Absolute: 0.2 10*3/uL (ref 0.0–0.5)
Eosinophils Relative: 3 %
HCT: 29 % — ABNORMAL LOW (ref 36.0–46.0)
Hemoglobin: 9 g/dL — ABNORMAL LOW (ref 12.0–15.0)
Immature Granulocytes: 0 %
Lymphocytes Relative: 27 %
Lymphs Abs: 1.4 10*3/uL (ref 0.7–4.0)
MCH: 30.8 pg (ref 26.0–34.0)
MCHC: 31 g/dL (ref 30.0–36.0)
MCV: 99.3 fL (ref 80.0–100.0)
Monocytes Absolute: 0.5 10*3/uL (ref 0.1–1.0)
Monocytes Relative: 9 %
Neutro Abs: 3.2 10*3/uL (ref 1.7–7.7)
Neutrophils Relative %: 60 %
Platelets: 297 10*3/uL (ref 150–400)
RBC: 2.92 MIL/uL — ABNORMAL LOW (ref 3.87–5.11)
RDW: 14.5 % (ref 11.5–15.5)
WBC: 5.3 10*3/uL (ref 4.0–10.5)
nRBC: 0 % (ref 0.0–0.2)

## 2024-03-22 LAB — BASIC METABOLIC PANEL WITH GFR
Anion gap: 9 (ref 5–15)
BUN: 27 mg/dL — ABNORMAL HIGH (ref 8–23)
CO2: 21 mmol/L — ABNORMAL LOW (ref 22–32)
Calcium: 8.2 mg/dL — ABNORMAL LOW (ref 8.9–10.3)
Chloride: 112 mmol/L — ABNORMAL HIGH (ref 98–111)
Creatinine, Ser: 1.34 mg/dL — ABNORMAL HIGH (ref 0.44–1.00)
GFR, Estimated: 37 mL/min — ABNORMAL LOW (ref >60)
Glucose, Bld: 95 mg/dL (ref 70–99)
Potassium: 3.7 mmol/L (ref 3.5–5.1)
Sodium: 142 mmol/L (ref 135–145)

## 2024-03-22 MED ORDER — OXYCODONE HCL 5 MG PO TABS: 2.5000 mg | ORAL_TABLET | Freq: Four times a day (QID) | ORAL | 0 refills | Status: AC | PRN

## 2024-03-22 MED ORDER — FUROSEMIDE 40 MG PO TABS
40.0000 mg | ORAL_TABLET | Freq: Every day | ORAL | Status: DC | PRN
Start: 2024-03-22 — End: 2024-05-21

## 2024-03-22 NOTE — Plan of Care (Signed)

## 2024-03-22 NOTE — Consult Note (Signed)
 WOC Nurse ostomy follow up Stoma type/location: RUQ, transverse colostomy Stomal assessment/size: 2 round, budded Peristomal assessment: NA Treatment options for stomal/peristomal skin: 2 barrier ring Output liquid  Ostomy pouching: 2pc. 2 3/4 with barrier ring and barrier extenders Education provided:  Patient's daughter reports she changed yesterday independently because it got too full.  Daughter reports independence with pouch change I have provided [employer] catlog with items marked and I have also provided [employer] item numbers in case they do not use [employer].   HHRN and PT arranged by TOC.    Enrolled patient in [place] Secure Start Discharge program: Yes  Adequate supplies in the room for DC.   Deisi Salonga [facility], CNS, [employer] [phone]

## 2024-03-22 NOTE — Discharge Summary (Signed)
 Physician Discharge Summary  Cheryl Anderson WGN:[id number] DOB: 11/13/1928 DOA: 03/15/2024  PCP: Clem Currier, DO  Admit date: 03/15/2024 Discharge date: 03/22/2024  Admitted From: Home Discharge disposition: Home   Recommendations for Outpatient Follow-Up:   CBC, BMP 1 week Encourage p.o. hydration and food Holding ACE inhibitor for low blood pressure and made Lasix  as needed for now --please follow   Discharge Diagnosis:   Principal Problem:   Colonic intussusception [facility])    Discharge Condition: Improved.  Diet recommendation:  Regular.  Wound care: None.  Code status: Full.   History of Present Illness:   Cheryl Anderson is a 89 y.o. female with medical history significant for hypertension, hyperlipidemia, CHF, BPPV being admitted to the hospital with abdominal pain and nausea found to have colonic intussusception.  History is provided by the patient as well as her daughter who is at the bedside, they state that over the last year she has had intermittent bouts of lower abdominal pain with associated mild nausea.  These typically were not incredibly severe, and got better in a few hours if she cut back on her p.o. intake.  In the last 24 to 48 hours, she has had similar pain, but it has been more intense and unrelenting.  For this reason, she came to the ER for evaluation.  She has had a small loose bowel movement yesterday, is not sure if she has continued to pass gas.  She has some associated nausea, but has not vomited.  She denies any fevers or chills, patient and daughter deny any black stools or unintentional weight loss.    Hospital Course by Problem:   Colonic intussusception s/p surgery with end colostomy 6/9 -Graduated diet as per general surgery currently on soft  -ambulate Pain control    HFpEF -hold ACE   AKI? - Improved with IV fluids    Medical Consultants:   General Surgery   Discharge Exam:   Vitals:   03/21/24 2010 03/22/24  0530  BP: (!) 143/64 135/67  Pulse: 79 71  Resp: 18 17  Temp: 98.2 F (36.8 C) 97.7 F (36.5 C)  SpO2: 94% 95%   Vitals:   03/21/24 0455 03/21/24 1234 03/21/24 2010 03/22/24 0530  BP: (!) 130/47 (!) 133/50 (!) 143/64 135/67  Pulse: 73 77 79 71  Resp: 18  18 17   Temp: 97.9 F (36.6 C) 98.1 F (36.7 C) 98.2 F (36.8 C) 97.7 F (36.5 C)  TempSrc:  Oral    SpO2: 98% 99% 94% 95%  Weight:      Height:        General exam: Appears calm and comfortable.    The results of significant diagnostics from this hospitalization (including imaging, microbiology, ancillary and laboratory) are listed below for reference.     Procedures and Diagnostic Studies:   ECHOCARDIOGRAM COMPLETE Result Date: 03/16/2024    ECHOCARDIOGRAM REPORT   Patient Name:   Cheryl Anderson Date of Exam: 03/16/2024 Medical Rec #:  [id number]         Height:       65.0 in Accession #:    [id number]        Weight:       138.8 lb Date of Birth:  05/20/1929          BSA:          1.694 m Patient Age:    89 years          BP:  179/74 mmHg Patient Gender: F                 HR:           58 bpm. Exam Location:  Inpatient Procedure: 2D Echo, Cardiac Doppler and Color Doppler (Both Spectral and Color            Flow Doppler were utilized during procedure). Indications:    CHF  History:        Patient has prior history of Echocardiogram examinations, most                 recent 11/16/2021. CAD, Signs/Symptoms:Syncope; Risk                 Factors:Hypertension.  Sonographer:    Janette Medley Referring Phys: 54 SAMUEL G MCDOWELL IMPRESSIONS  1. Left ventricular ejection fraction, by estimation, is 55 to 60%. The left ventricle has normal function. The left ventricle has no regional wall motion abnormalities. Left ventricular diastolic parameters are consistent with Grade I diastolic dysfunction (impaired relaxation).  2. Right ventricular systolic function is normal. The right ventricular size is normal.  3. The mitral valve is  abnormal. Trivial mitral valve regurgitation. No evidence of mitral stenosis.  4. The aortic valve is tricuspid. Aortic valve regurgitation is not visualized. No aortic stenosis is present.  5. The inferior vena cava is normal in size with greater than 50% respiratory variability, suggesting right atrial pressure of 3 mmHg. FINDINGS  Left Ventricle: Left ventricular ejection fraction, by estimation, is 55 to 60%. The left ventricle has normal function. The left ventricle has no regional wall motion abnormalities. Strain was performed and the global longitudinal strain is indeterminate. The left ventricular internal cavity size was normal in size. There is no left ventricular hypertrophy. Left ventricular diastolic parameters are consistent with Grade I diastolic dysfunction (impaired relaxation). Right Ventricle: The right ventricular size is normal. No increase in right ventricular wall thickness. Right ventricular systolic function is normal. Left Atrium: Left atrial size was normal in size. Right Atrium: Right atrial size was normal in size. Pericardium: There is no evidence of pericardial effusion. Mitral Valve: The mitral valve is abnormal. There is moderate thickening of the mitral valve leaflet(s). There is mild calcification of the mitral valve leaflet(s). Mild mitral annular calcification. Trivial mitral valve regurgitation. No evidence of mitral valve stenosis. Tricuspid Valve: The tricuspid valve is normal in structure. Tricuspid valve regurgitation is mild . No evidence of tricuspid stenosis. Aortic Valve: The aortic valve is tricuspid. Aortic valve regurgitation is not visualized. No aortic stenosis is present. Pulmonic Valve: The pulmonic valve was normal in structure. Pulmonic valve regurgitation is mild. No evidence of pulmonic stenosis. Aorta: The aortic root is normal in size and structure. Venous: The inferior vena cava is normal in size with greater than 50% respiratory variability, suggesting  right atrial pressure of 3 mmHg. IAS/Shunts: No atrial level shunt detected by color flow Doppler. Additional Comments: 3D was performed not requiring image post processing on an independent workstation and was indeterminate.  LEFT VENTRICLE PLAX 2D LVIDd:         3.90 cm   Diastology LVIDs:         2.70 cm   LV e' medial:    7.29 cm/s LV PW:         0.90 cm   LV E/e' medial:  10.0 LV IVS:        1.00 cm   LV e' lateral:  8.05 cm/s LVOT diam:     1.80 cm   LV E/e' lateral: 9.0 LV SV:         60 LV SV Index:   35 LVOT Area:     2.54 cm  RIGHT VENTRICLE             IVC RV S prime:     13.90 cm/s  IVC diam: 1.60 cm TAPSE (M-mode): 2.3 cm LEFT ATRIUM             Index        RIGHT ATRIUM           Index LA diam:        2.80 cm 1.65 cm/m   RA Area:     13.10 cm LA Vol (A2C):   34.4 ml 20.31 ml/m  RA Volume:   27.50 ml  16.24 ml/m LA Vol (A4C):   32.5 ml 19.19 ml/m LA Biplane Vol: 35.1 ml 20.72 ml/m  AORTIC VALVE LVOT Vmax:   105.00 cm/s LVOT Vmean:  69.400 cm/s LVOT VTI:    0.235 m  AORTA Ao Root diam: 2.40 cm Ao Asc diam:  3.10 cm MITRAL VALVE               TRICUSPID VALVE MV Area (PHT): 2.87 cm    TR Peak grad:   32.5 mmHg MV Decel Time: 264 msec    TR Vmax:        285.00 cm/s MR Peak grad: 47.6 mmHg MR Vmax:      345.00 cm/s  SHUNTS MV E velocity: 72.80 cm/s  Systemic VTI:  0.24 m MV A velocity: 87.40 cm/s  Systemic Diam: 1.80 cm MV E/A ratio:  0.83 Janelle Mediate MD Electronically signed by Janelle Mediate MD Signature Date/Time: 03/16/2024/3:03:06 PM    Final    CT ABDOMEN PELVIS W CONTRAST Result Date: 03/15/2024 CLINICAL DATA:  Bowel obstruction suspected EXAM: CT ABDOMEN AND PELVIS WITH CONTRAST TECHNIQUE: Multidetector CT imaging of the abdomen and pelvis was performed using the standard protocol following bolus administration of intravenous contrast. RADIATION DOSE REDUCTION: This exam was performed according to the departmental dose-optimization program which includes automated exposure control, adjustment  of the mA and/or kV according to patient size and/or use of iterative reconstruction technique. CONTRAST:  OMNIPAQUE  IOHEXOL  300 MG/ML  SOLN COMPARISON:  None Available. FINDINGS: Lower chest: No focal consolidation or pulmonary nodule in the lung bases. No pleural effusion or pneumothorax demonstrated. Partially imaged heart size is normal. Hepatobiliary: No focal hepatic lesions. No intra or extrahepatic biliary ductal dilation. Normal gallbladder. Pancreas: No focal lesions or main ductal dilation. Spleen: Normal in size without focal abnormality. Adrenals/Urinary Tract: No adrenal nodules. Bilateral renal cysts, including an upper pole left renal cyst measuring 1.4 cm (2:20) which demonstrates intermittent density. A smaller, subcentimeter focus in the anterior left interpolar kidney (2:25) also appears mildly hyperattenuating. No hydronephrosis or calculi. No focal bladder wall thickening. Stomach/Bowel: Normal appearance of the stomach. Colonic intussusception involving the mid transverse colon with intussusceptum extending to the level of the distal descending colon, where there is lobulated enhancing tissue (2:53). Mild gas-filled dilation of the upstream proximal transverse colon. Normal appendix. Vascular/Lymphatic: Aortic atherosclerosis. No enlarged abdominal or pelvic lymph nodes. Reproductive: No adnexal masses. Other: Small volume pelvic free fluid. No free air or fluid collection. Musculoskeletal: No acute or abnormal lytic or blastic osseous lesions. Multilevel degenerative changes of the partially imaged thoracic and lumbar spine. Grade 1 anterolisthesis at  L4-5. Small fat-containing right inguinal hernia. IMPRESSION: 1. Colonic intussusception involving the mid transverse colon with intussusceptum extending to the level of the distal descending colon, where there is lobulated enhancing tissue, which may represent neoplasm acting as a lead point. 2. Resulting obstruction with mild gas-filled  dilation of the upstream proximal transverse colon. 3. Bilateral renal cysts, including an upper pole left renal cyst measuring 1.4 cm which demonstrates intermittent density. A smaller, subcentimeter focus in the anterior left interpolar kidney also appears mildly hyperattenuating. These may represent hemorrhagic or proteinaceous cysts, but are incompletely characterized. Recommend further evaluation with nonemergent renal protocol MRI or CT abdomen. 4.  Aortic Atherosclerosis (ICD10-I70.0). Electronically Signed   By: Limin  Xu M.D.   On: 03/15/2024 12:01     Labs:   Basic Metabolic Panel: Recent Labs  Lab 03/19/24 0557 03/20/24 0556 03/20/24 0928 03/21/24 0526 03/22/24 0446  NA 137 139 138 140 142  K 5.1 4.8 4.3 3.9 3.7  CL 106 112* 108 111 112*  CO2 22 21* 24 23 21*  GLUCOSE 126* 96 95 98 95  BUN 18 42* 40* 39* 27*  CREATININE 1.35* 2.54* 2.39* 1.92* 1.34*  CALCIUM  8.6* 8.3* 8.5* 8.2* 8.2*   GFR Estimated Creatinine Clearance: 23.6 mL/min (A) (by C-G formula based on SCr of 1.34 mg/dL (H)). Liver Function Tests: No results for input(s): AST, ALT, ALKPHOS, BILITOT, PROT, ALBUMIN in the last 168 hours. No results for input(s): LIPASE, AMYLASE in the last 168 hours. No results for input(s): AMMONIA in the last 168 hours. Coagulation profile No results for input(s): INR, PROTIME in the last 168 hours.  CBC: Recent Labs  Lab 03/18/24 0528 03/19/24 0557 03/20/24 0556 03/21/24 0526 03/22/24 0446  WBC 8.8 11.1* 9.3 7.2 5.3  NEUTROABS 6.5 8.9* 6.9 4.6 3.2  HGB 11.4* 12.1 10.1* 9.7* 9.0*  HCT 36.4 38.4 33.5* 32.0* 29.0*  MCV 96.6 96.5 101.5* 99.4 99.3  PLT 254 290 206 289 297   Cardiac Enzymes: No results for input(s): CKTOTAL, CKMB, CKMBINDEX, TROPONINI in the last 168 hours. BNP: Invalid input(s): POCBNP CBG: No results for input(s): GLUCAP in the last 168 hours. D-Dimer No results for input(s): DDIMER in the last 72 hours. Hgb  A1c No results for input(s): HGBA1C in the last 72 hours. Lipid Profile No results for input(s): CHOL, HDL, LDLCALC, TRIG, CHOLHDL, LDLDIRECT in the last 72 hours. Thyroid function studies No results for input(s): TSH, T4TOTAL, T3FREE, THYROIDAB in the last 72 hours.  Invalid input(s): FREET3 Anemia work up No results for input(s): VITAMINB12, FOLATE, FERRITIN, TIBC, IRON, RETICCTPCT in the last 72 hours. Microbiology No results found for this or any previous visit (from the past 240 hours).   Discharge Instructions:   Discharge Instructions     Amb Referral to Ostomy Clinic   Complete by: As directed    Reason for referral modifiers: Pre and post-operative counseling for ostomy management   Discharge instructions   Complete by: As directed    Soft diet-- try to stay hydrated   Increase activity slowly   Complete by: As directed       Allergies as of 03/22/2024       Reactions   Codeine  Nausea Only        Medication List     PAUSE taking these medications    ramipril  5 MG capsule Wait to take this until your doctor or other care provider tells you to start again. Commonly known as: ALTACE  TAKE 1 CAPSULE BY  MOUTH EVERY DAY       TAKE these medications    aspirin EC 81 MG tablet Take 1 tablet by mouth daily.   atorvastatin  80 MG tablet Commonly known as: LIPITOR TAKE 1 TABLET BY MOUTH EVERY DAY   dorzolamide-timolol 2-0.5 % ophthalmic solution Commonly known as: COSOPT 1 drop 2 (two) times daily.   fluticasone  50 MCG/ACT nasal spray Commonly known as: FLONASE  SPRAY 2 SPRAYS INTO EACH NOSTRIL EVERY DAY What changed: See the new instructions.   furosemide  40 MG tablet Commonly known as: LASIX  Take 1 tablet (40 mg total) by mouth daily as needed for fluid or edema. What changed:  when to take this reasons to take this   isosorbide  mononitrate 30 MG 24 hr tablet Commonly known as: IMDUR  TAKE 1 TABLET BY MOUTH  EVERY DAY   nitroGLYCERIN  0.4 MG SL tablet Commonly known as: NITROSTAT  Take every 5 minutes for 3 doses. If pain not resolved, call 911 or go to ER.   oxyCODONE  5 MG immediate release tablet Commonly known as: Oxy IR/ROXICODONE  Take 0.5-1 tablets (2.5-5 mg total) by mouth every 6 (six) hours as needed for moderate pain (pain score 4-6).   prednisoLONE acetate 1 % ophthalmic suspension Commonly known as: PRED FORTE Place 1 drop into the left eye in the morning and at bedtime.   risedronate  35 MG tablet Commonly known as: ACTONEL  Take 1 tablet (35 mg total) by mouth every 7 (seven) days. with water on empty stomach, nothing by mouth or lie down for next 30 minutes.               Durable Medical Equipment  (From admission, onward)           Start     Ordered   03/20/24 1025  For home use only DME Walker rolling  Once       Question Answer Comment  Walker: With 5 Inch Wheels   Patient needs a walker to treat with the following condition Difficulty in walking, not elsewhere classified      03/20/24 1024            Follow-up Information     [facility]  Follow up.   Why: [facility] will follow up with you at discharge to provide home health physical therapy and nursing services Contact information: [address] [place] [address] [phone]         [facility], [facility] Follow up on 03/29/2024.   Specialty: General Surgery Why: 9:30am, This is a nurse only visit for staple removal, Arrive 30 minutes prior to your appointment time, Please bring your insurance card and photo ID Contact information: [address] [place] [address] [phone]         Kinsinger, Alphonso Aschoff, MD Follow up on 04/11/2024.   Specialty: General Surgery Why: 2:10pm, Arrive 15 minutes prior to your appointment time, Please bring your insurance card and photo ID Contact information: [address] [place]  [address] [phone]                  Time coordinating discharge: 45 minutes  Signed:  Enrigue Harvard DO  [facility] 03/22/2024, 10:31 AM

## 2024-03-22 NOTE — Progress Notes (Signed)
 Progress Note  4 Days Post-Op  Subjective: Patient reports pain is well controlled. Eating well. Daughter has been helping with ostomy care. Ready to go home.   Objective: Vital signs in last 24 hours: Temp:  [97.7 F (36.5 C)-98.2 F (36.8 C)] 97.7 F (36.5 C) (06/13 0530) Pulse Rate:  [71-79] 71 (06/13 0530) Resp:  [17-18] 17 (06/13 0530) BP: (133-143)/(50-67) 135/67 (06/13 0530) SpO2:  [94 %-99 %] 95 % (06/13 0530) Last BM Date : 03/21/24  Intake/Output from previous day: 06/12 0701 - 06/13 0700 In: 120 [P.O.:120] Out: -  Intake/Output this shift: Total I/O In: -  Out: 600 [Urine:350; Stool:250]  PE: General: pleasant, WD, WN elderly female who is laying in bed in NAD Heart: regular, rate, and rhythm.   Lungs: Respiratory effort nonlabored Abd: soft, NT, ND, incision C/D/I with staples present, ostomy viable with liquid stool and gas in pouch  Psych: A&Ox3 with an appropriate affect.    Lab Results:  Recent Labs    03/21/24 0526 03/22/24 0446  WBC 7.2 5.3  HGB 9.7* 9.0*  HCT 32.0* 29.0*  PLT 289 297   BMET Recent Labs    03/21/24 0526 03/22/24 0446  NA 140 142  K 3.9 3.7  CL 111 112*  CO2 23 21*  GLUCOSE 98 95  BUN 39* 27*  CREATININE 1.92* 1.34*  CALCIUM  8.2* 8.2*   PT/INR No results for input(s): LABPROT, INR in the last 72 hours. CMP     Component Value Date/Time   NA 142 03/22/2024 0446   NA 142 01/23/2023 1507   K 3.7 03/22/2024 0446   CL 112 (H) 03/22/2024 0446   CO2 21 (L) 03/22/2024 0446   GLUCOSE 95 03/22/2024 0446   BUN 27 (H) 03/22/2024 0446   BUN 17 01/23/2023 1507   CREATININE 1.34 (H) 03/22/2024 0446   CREATININE 1.03 12/02/2014 1631   CALCIUM  8.2 (L) 03/22/2024 0446   PROT 6.7 03/15/2024 0957   PROT 6.2 04/22/2022 1143   ALBUMIN 3.5 03/15/2024 0957   ALBUMIN 3.9 04/22/2022 1143   AST 17 03/15/2024 0957   ALT 11 03/15/2024 0957   ALKPHOS 58 03/15/2024 0957   BILITOT 0.9 03/15/2024 0957   BILITOT 0.4 04/22/2022  1143   GFRNONAA 37 (L) 03/22/2024 0446   GFRAA 50 (L) 12/20/2019 1530   Lipase     Component Value Date/Time   LIPASE 31 03/15/2024 0957       Studies/Results: No results found.  Anti-infectives: Anti-infectives (From admission, onward)    Start     Dose/Rate Route Frequency Ordered Stop   03/18/24 0700  cefoTEtan  (CEFOTAN ) 2 g in sodium chloride  0.9 % 100 mL IVPB        2 g 200 mL/hr over 30 Minutes Intravenous On call to O.R. 03/17/24 1027 03/19/24 0929   03/17/24 1400  neomycin  (MYCIFRADIN ) tablet 1,000 mg       Placed in And Linked Group   1,000 mg Oral 3 times per day 03/17/24 1027 03/17/24 2124   03/17/24 1400  metroNIDAZOLE  (FLAGYL ) tablet 1,000 mg       Placed in And Linked Group   1,000 mg Oral 3 times per day 03/17/24 1027 03/17/24 2125        Assessment/Plan S/P PARTIAL COLECTOMY WITH COLOSTOMY 03/18/2024  Pathology consistent with invasive cancer, 0/12 lymph nodes, margins well negative. T1N0M0. This would not be a cancer requiring systemic therapy. She may benefit from colonoscopy in the next year for surveillance.  Stable for DC from surgical standpoint. Follow up and instructions in AVS.    LOS: 7 days      Annetta Killian, [facility] 03/22/2024, 9:52 AM Please see Amion for pager number during day hours 7:00am-4:30pm

## 2024-03-22 NOTE — Consult Note (Signed)
 WOC contacted the patient's daughter with appointment for 6/20 @ 10am.  Verbalized understanding.   Antinette Keough [facility], CNS, [employer] [phone]

## 2024-03-22 NOTE — TOC Transition Note (Signed)
 Transition of Care [facility]) - Discharge Note   Patient Details  Name: Cheryl Anderson MRN: [id number] Date of Birth: 1929-03-06  Transition of Care [facility]) CM/SW Contact:  Marty Sleet, LCSW Phone Number: 03/22/2024, 10:36 AM   Clinical Narrative:    Pt to discharge home w/ daughter. [facility] following pt to provide home health PT and RN services. HH orders are in place. RW has been delivered to pt's room by [facility]. Daughter to provide transportation home for pt. No further TOC needs identified. TOC signing off.    Final next level of care: Home w Home Health Services Barriers to Discharge: No Barriers Identified   Patient Goals and CMS Choice Patient states their goals for this hospitalization and ongoing recovery are:: To return home CMS Medicare.gov Compare Post Acute Care list provided to:: Patient Choice offered to / list presented to : Patient [place] ownership interest in [facility].provided to::  (NA)    Discharge Placement                       Discharge Plan and Services Additional resources added to the After Visit Summary for                  DME Arranged: Walker rolling DME Agency: [facility] Date DME Agency Contacted: 03/20/24 Time DME Agency Contacted: 1031 Representative spoke with at DME Agency: Zack HH Arranged: RN, PT HH Agency: [facility] Date HH Agency Contacted: 03/20/24 Time HH Agency Contacted: 1320 Representative spoke with at [facility] Agency: Renetta Carter  Social Drivers of Health (SDOH) Interventions SDOH Screenings   Food Insecurity: No Food Insecurity (03/15/2024)  Housing: Low Risk  (03/15/2024)  Transportation Needs: No Transportation Needs (03/15/2024)  Utilities: Not At Risk (03/15/2024)  Alcohol Screen: Low Risk  (06/20/2023)  Depression (PHQ2-9): Low Risk  (06/20/2023)  Financial Resource Strain: Low Risk  (06/20/2023)  Physical Activity: Inactive (06/20/2023)  Social Connections: Socially Isolated  (03/15/2024)  Stress: No Stress Concern Present (06/20/2023)  Tobacco Use: Medium Risk (03/18/2024)  Health Literacy: Adequate Health Literacy (06/20/2023)     Readmission Risk Interventions    03/20/2024   10:30 AM 03/18/2024    9:36 AM  Readmission Risk Prevention Plan  Post Dischage Appt  Complete  Medication Screening  Complete  Transportation Screening Complete Complete  PCP or Specialist Appt within 5-7 Days Complete   Home Care Screening Complete   Medication Review (RN CM) Complete

## 2024-03-29 ENCOUNTER — Ambulatory Visit (HOSPITAL_COMMUNITY): Admitting: Nurse Practitioner

## 2024-04-10 NOTE — Progress Notes (Signed)
 HPI: FU HTN and diastolic CHF. Echocardiogram June 2025 showed normal LV function, grade 1 diastolic dysfunction.  Recently discharged following admission for colonic intussusception requiring partial colectomy/colostomy; note pathology consistent with invasive cancer but lymph nodes and margins negative.  Also note renal lesions found on abdominal CT and renal protocol MRI or CT recommended.  Since last seen, she denies dyspnea, chest pain, palpitations or syncope.  She has had mild ankle edema which is chronic.  Current Outpatient Medications  Medication Sig Dispense Refill   aspirin EC 81 MG tablet Take 1 tablet by mouth daily.     atorvastatin  (LIPITOR) 80 MG tablet TAKE 1 TABLET BY MOUTH EVERY DAY 90 tablet 3   dorzolamide-timolol (COSOPT) 2-0.5 % ophthalmic solution 1 drop 2 (two) times daily.     furosemide  (LASIX ) 40 MG tablet Take 1 tablet (40 mg total) by mouth daily as needed for fluid or edema.     isosorbide  mononitrate (IMDUR ) 30 MG 24 hr tablet TAKE 1 TABLET BY MOUTH EVERY DAY 90 tablet 1   prednisoLONE acetate (PRED FORTE) 1 % ophthalmic suspension Place 1 drop into the left eye in the morning and at bedtime.     fluticasone  (FLONASE ) 50 MCG/ACT nasal spray SPRAY 2 SPRAYS INTO EACH NOSTRIL EVERY DAY (Patient not taking: Reported on 04/18/2024) 48 mL 3   nitroGLYCERIN  (NITROSTAT ) 0.4 MG SL tablet Take every 5 minutes for 3 doses. If pain not resolved, call 911 or go to ER. (Patient not taking: Reported on 04/18/2024) 30 tablet 2   oxyCODONE  (OXY IR/ROXICODONE ) 5 MG immediate release tablet Take 0.5-1 tablets (2.5-5 mg total) by mouth every 6 (six) hours as needed for moderate pain (pain score 4-6). 20 tablet 0   [Paused] ramipril  (ALTACE ) 5 MG capsule TAKE 1 CAPSULE BY MOUTH EVERY DAY 90 capsule 1   risedronate  (ACTONEL ) 35 MG tablet Take 1 tablet (35 mg total) by mouth every 7 (seven) days. with water on empty stomach, nothing by mouth or lie down for next 30 minutes. (Patient not  taking: Reported on 04/18/2024) 4 tablet 3   No current facility-administered medications for this visit.     Past Medical History:  Diagnosis Date   (HFpEF) heart failure with preserved ejection fraction (HCC)    BPPV (benign paroxysmal positional vertigo) 03/16/2016   Callus of foot 04/01/2014   Hyperlipidemia    Hypertension    Leg cramps, sleep related 05/07/2012   Osteoporosis    Plantar fasciitis 04/01/2014   Subcutaneous nodule of chest wall 12/15/2010    Past Surgical History:  Procedure Laterality Date   No prior surgery     PARTIAL COLECTOMY N/A 03/18/2024   Procedure: PARTIAL COLECTOMY WITH COLOSTOMY;  Surgeon: Stevie Herlene Righter, MD;  Location: [facility];  Service: General;  Laterality: N/A;    Social History   Socioeconomic History   Marital status: Widowed    Spouse name: Not on file   Number of children: 6   Years of education: 57   Highest education level: Not on file  Occupational History   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  Tobacco Use   Smoking status: Former    Current packs/day: 1.00    Average packs/day: 1 pack/day for 69.0 years (69.0 ttl pk-yrs)    Types: Cigarettes    Passive exposure: Never   Smokeless tobacco: Never  Vaping Use   Vaping status: Never Used  Substance and Sexual Activity   Alcohol use:  Yes    Alcohol/week: 0.0 standard drinks of alcohol    Comment: OCCASIONALLY   Drug use: No   Sexual activity: Never  Other Topics Concern   Not on file  Social History Narrative   Health Care POA:    Emergency Contact: daughter, Sharlet [phone]   End of Life Plan: gave pt AD pamphlet   Who lives with you: daughter, 4 grandchildren   Any pets: 4 dogs   Diet: Pt has a varied diet of protein, starch and vegetables.   Exercise: Pt does not have regular exercise routine but does work in garden daily.   Seatbelts: Pt reports wearing seatbelt when in vehicles.    Hobbies: gardening         Social Drivers of  Corporate investment banker Strain: Low Risk  (06/20/2023)   Overall Financial Resource Strain (CARDIA)    Difficulty of Paying Living Expenses: Not hard at all  Food Insecurity: No Food Insecurity (03/15/2024)   Hunger Vital Sign    Worried About Running Out of Food in the Last Year: Never true    Ran Out of Food in the Last Year: Never true  Transportation Needs: No Transportation Needs (03/15/2024)   PRAPARE - Administrator, Civil Service (Medical): No    Lack of Transportation (Non-Medical): No  Physical Activity: Inactive (06/20/2023)   Exercise Vital Sign    Days of Exercise per Week: 0 days    Minutes of Exercise per Session: 0 min  Stress: No Stress Concern Present (06/20/2023)   [employer] of Occupational Health - Occupational Stress Questionnaire    Feeling of Stress : Not at all  Social Connections: Socially Isolated (03/15/2024)   Social Connection and Isolation Panel    Frequency of Communication with Friends and Family: More than three times a week    Frequency of Social Gatherings with Friends and Family: More than three times a week    Attends Religious Services: Never    Database administrator or Organizations: No    Attends Banker Meetings: Never    Marital Status: Widowed  Intimate Partner Violence: Not At Risk (03/15/2024)   Humiliation, Afraid, Rape, and Kick questionnaire    Fear of Current or Ex-Partner: No    Emotionally Abused: No    Physically Abused: No    Sexually Abused: No    Family History  Problem Relation Age of Onset   Cancer Sister    Cancer Brother        Throat CA   Diabetes Daughter     ROS: no fevers or chills, productive cough, hemoptysis, dysphasia, odynophagia, melena, hematochezia, dysuria, hematuria, rash, seizure activity, orthopnea, PND, claudication. Remaining systems are negative.  Physical Exam: Well-developed well-nourished in no acute distress.  Skin is warm and dry.  HEENT is normal.  Neck is  supple.  Chest is clear to auscultation with normal expansion.  Cardiovascular exam is irregular Abdominal exam nontender or distended. No masses palpated. Extremities show 1+ ankle edema. neuro grossly intact  EKG Interpretation Date/Time:  Thursday April 18 2024 11:15:30 EDT Ventricular Rate:  69 PR Interval:  156 QRS Duration:  74 QT Interval:  376 QTC Calculation: 402 R Axis:   44  Text Interpretation: Sinus rhythm  pacs Confirmed by Pietro Rogue ([id number]) on 04/18/2024 11:40:30 AM     A/P  1 heart failure preserved ejection fraction-patient is euvolemic on examination.  Continue Lasix  at present dose.  Check potassium and  renal function.  2 hyperlipidemia-continue statin.  3 hypertension-patient's blood pressure is elevated; however she has not taken her medications today.  She will follow and we will advance regimen if needed.  4 renal lesions-noted on recent CT.  I have asked her to follow-up with primary care for this issue.  Redell Shallow, MD

## 2024-04-18 ENCOUNTER — Ambulatory Visit: Attending: Cardiology | Admitting: Cardiology

## 2024-04-18 ENCOUNTER — Encounter: Payer: Self-pay | Admitting: Cardiology

## 2024-04-18 VITALS — BP 170/60 | HR 81 | Ht 66.0 in | Wt 124.2 lb

## 2024-04-18 DIAGNOSIS — I5032 Chronic diastolic (congestive) heart failure: Secondary | ICD-10-CM | POA: Diagnosis not present

## 2024-04-18 DIAGNOSIS — E785 Hyperlipidemia, unspecified: Secondary | ICD-10-CM

## 2024-04-18 DIAGNOSIS — I10 Essential (primary) hypertension: Secondary | ICD-10-CM | POA: Diagnosis not present

## 2024-04-18 NOTE — Patient Instructions (Signed)

## 2024-04-19 ENCOUNTER — Ambulatory Visit: Payer: Self-pay | Admitting: Cardiology

## 2024-04-19 LAB — BASIC METABOLIC PANEL WITH GFR
BUN/Creatinine Ratio: 12 (ref 12–28)
BUN: 15 mg/dL (ref 10–36)
CO2: 22 mmol/L (ref 20–29)
Calcium: 9.3 mg/dL (ref 8.7–10.3)
Chloride: 106 mmol/L (ref 96–106)
Creatinine, Ser: 1.28 mg/dL — ABNORMAL HIGH (ref 0.57–1.00)
Glucose: 95 mg/dL (ref 70–99)
Potassium: 4.1 mmol/L (ref 3.5–5.2)
Sodium: 142 mmol/L (ref 134–144)
eGFR: 39 mL/min/1.73 — ABNORMAL LOW (ref >59)

## 2024-05-21 ENCOUNTER — Other Ambulatory Visit: Payer: Self-pay | Admitting: Student

## 2024-05-21 DIAGNOSIS — I10 Essential (primary) hypertension: Secondary | ICD-10-CM

## 2024-05-21 DIAGNOSIS — I5032 Chronic diastolic (congestive) heart failure: Secondary | ICD-10-CM

## 2024-07-10 ENCOUNTER — Other Ambulatory Visit: Payer: Self-pay

## 2024-07-10 DIAGNOSIS — I10 Essential (primary) hypertension: Secondary | ICD-10-CM

## 2024-07-10 MED ORDER — RAMIPRIL 5 MG PO CAPS: 5.0000 mg | ORAL_CAPSULE | Freq: Every day | ORAL | 1 refills | Status: AC

## 2024-07-10 NOTE — Telephone Encounter (Signed)
 Received call from patient's daughter requesting refill on Ramipril .   Per chart review this medication was paused and indicated that appt would be needed for follow up. However, cardiology note indicated for patient to continue medication as of 03/16/24.  Called daughter and LVM requesting that she call back to office in order to schedule BP follow up appt.   Will also forward message to PCP regarding refill.   Chiquita JAYSON English, RN

## 2024-10-16 ENCOUNTER — Encounter: Payer: Self-pay | Admitting: Cardiology
